# Patient Record
Sex: Female | Born: 1975 | Race: White | Hispanic: No | Marital: Single | Smoking: Former smoker
Health system: Southern US, Community
[De-identification: ages and names within clinical notes are randomized; demographics above are authoritative.]

## PROBLEM LIST (undated history)

## (undated) DIAGNOSIS — F1010 Alcohol abuse, uncomplicated: Secondary | ICD-10-CM

## (undated) DIAGNOSIS — F419 Anxiety disorder, unspecified: Secondary | ICD-10-CM

## (undated) DIAGNOSIS — I10 Essential (primary) hypertension: Secondary | ICD-10-CM

## (undated) DIAGNOSIS — M109 Gout, unspecified: Secondary | ICD-10-CM

## (undated) HISTORY — DX: Essential (primary) hypertension: I10

## (undated) HISTORY — DX: Anxiety disorder, unspecified: F41.9

---

## 1999-07-07 ENCOUNTER — Emergency Department (HOSPITAL_COMMUNITY): Admission: EM | Admit: 1999-07-07 | Discharge: 1999-07-07 | Payer: Self-pay | Admitting: Emergency Medicine

## 2001-05-30 ENCOUNTER — Other Ambulatory Visit: Admission: RE | Admit: 2001-05-30 | Discharge: 2001-05-30 | Payer: Self-pay

## 2002-02-27 ENCOUNTER — Other Ambulatory Visit: Admission: RE | Admit: 2002-02-27 | Discharge: 2002-02-27 | Payer: Self-pay | Admitting: *Deleted

## 2003-04-10 ENCOUNTER — Other Ambulatory Visit: Admission: RE | Admit: 2003-04-10 | Discharge: 2003-04-10 | Payer: Self-pay | Admitting: *Deleted

## 2003-12-26 ENCOUNTER — Emergency Department (HOSPITAL_COMMUNITY): Admission: EM | Admit: 2003-12-26 | Discharge: 2003-12-26 | Payer: Self-pay | Admitting: Emergency Medicine

## 2004-06-29 ENCOUNTER — Other Ambulatory Visit: Admission: RE | Admit: 2004-06-29 | Discharge: 2004-06-29 | Payer: Self-pay | Admitting: Family Medicine

## 2004-11-18 ENCOUNTER — Other Ambulatory Visit: Admission: RE | Admit: 2004-11-18 | Discharge: 2004-11-18 | Payer: Self-pay | Admitting: Family Medicine

## 2007-02-02 ENCOUNTER — Other Ambulatory Visit: Admission: RE | Admit: 2007-02-02 | Discharge: 2007-02-02 | Payer: Self-pay | Admitting: Family Medicine

## 2008-03-24 ENCOUNTER — Ambulatory Visit (HOSPITAL_COMMUNITY): Admission: RE | Admit: 2008-03-24 | Discharge: 2008-03-24 | Payer: Self-pay | Admitting: Obstetrics & Gynecology

## 2009-07-31 ENCOUNTER — Other Ambulatory Visit: Admission: RE | Admit: 2009-07-31 | Discharge: 2009-07-31 | Payer: Self-pay | Admitting: Family Medicine

## 2012-12-14 ENCOUNTER — Ambulatory Visit (INDEPENDENT_AMBULATORY_CARE_PROVIDER_SITE_OTHER): Payer: BC Managed Care – PPO | Admitting: Family Medicine

## 2012-12-14 VITALS — BP 110/90 | HR 104 | Temp 98.4°F | Resp 16 | Ht 61.0 in | Wt 118.0 lb

## 2012-12-14 DIAGNOSIS — R51 Headache: Secondary | ICD-10-CM

## 2012-12-14 DIAGNOSIS — S0181XA Laceration without foreign body of other part of head, initial encounter: Secondary | ICD-10-CM

## 2012-12-14 DIAGNOSIS — Z23 Encounter for immunization: Secondary | ICD-10-CM

## 2012-12-14 DIAGNOSIS — S0180XA Unspecified open wound of other part of head, initial encounter: Secondary | ICD-10-CM

## 2012-12-14 MED ORDER — CEPHALEXIN 500 MG PO CAPS: 500.0000 mg | ORAL_CAPSULE | Freq: Two times a day (BID) | ORAL | Status: DC

## 2012-12-14 NOTE — Progress Notes (Signed)
Verbal consent obtained from the patient.  Local anesthesia with 4cc Lidocaine 2% with epinephrine.  Wound scrubbed with soap and water and rinsed.  Wound closed with #7 5-0 Prolene simple interrupted sutures.  Wound cleansed and dressed.

## 2012-12-14 NOTE — Patient Instructions (Addendum)

## 2012-12-14 NOTE — Progress Notes (Signed)
[facility] [address] [place] [address] [phone]  Date:  12/14/2012   Name:  Alexandra Dorsey   DOB:  1976-07-03   MRN:  [id number]  PCP:  No primary provider on file.    Chief Complaint: Laceration   History of Present Illness:  Alexandra Dorsey is a 37 y.o. very pleasant female patient who presents with the following:  She was walking her dog last night and fell- around 11:30 pm.  She tripped and hit her head on something on the ground- maybe a tree root.   No LOC.  She is tender over the cut, but no HA, no nausea or vomiting.  She "feel great" except for her laceration.  She wanted to see if she could avoid stitches but became aware that her cut was pretty bad so she came in.    She has missed her most recent menses but she saw her doctor yesterday and has blood and urine HCG testing- she is not pregnant, they think she may have not ovulated and she has medication to take for this but has not started it yet- unsure of the name.  She is generally healthy, not on any medications   There is no problem list on file for this patient.   History reviewed. No pertinent past medical history.  History reviewed. No pertinent past surgical history.  History  Substance Use Topics  . Smoking status: Never Smoker   . Smokeless tobacco: Not on file  . Alcohol Use: Not on file    No family history on file.  No Known Allergies  Medication list has been reviewed and updated.  No current outpatient prescriptions on file prior to visit.   No current facility-administered medications on file prior to visit.    Review of Systems:  As per HPI- otherwise negative.   Physical Examination: Filed Vitals:   12/14/12 1123  BP: 110/90  Pulse: 104  Temp: 98.4 F (36.9 C)  Resp: 16   Filed Vitals:   12/14/12 1123  Height: 5\' 1"  (1.549 m)  Weight: 118 lb (53.524 kg)   Body mass index is 22.31 kg/(m^2). Ideal Body Weight: Weight in (lb) to have  BMI = 25: 132  GEN: WDWN, NAD, Non-toxic, A & O x 3 HEENT: Atraumatic, Normocephalic. Neck supple. No masses, No LAD. Bilateral TM wnl, oropharynx normal.  PEERL,EOMI.   Laceration on her left medial forehead, approx 2.5 cm in length, down to fascia over skull.  Lies together well, neat edges, no visible debris Cervical spine is non- tender, normal ROM Ears and Nose: No external deformity. CV: RRR, No M/G/R. No JVD. No thrill. No extra heart sounds. PULM: CTA B, no wheezes, crackles, rhonchi. No retractions. No resp. distress. No accessory muscle use. EXTR: No c/c/e NEURO Normal gait. Normal strength, movement and DTR all extremities PSYCH: Normally interactive. Conversant. Not depressed or anxious appearing.  Calm demeanor.    Assessment and Plan: Laceration of face, initial encounter - Plan: Tdap vaccine greater than or equal to 7yo IM, cephALEXin (KEFLEX) 500 MG capsule  Pain, face  Laceration repaired as per note by Porfirio Oar, PA- C.  Will cover with keflex as the wound is about 12 hours long. explained slightly increased risk of infection in closing a wound late, but this large wound clearly needs to be closed Updated tdap as she is not sure of the date of her last shot and thinks it may have been 7  or 8 years ago.    Went over head injury precautions- if she develops HA, nausea, vomiting, confusion, or other symptoms please seek care right away   [facility], MD

## 2012-12-19 ENCOUNTER — Ambulatory Visit (INDEPENDENT_AMBULATORY_CARE_PROVIDER_SITE_OTHER): Payer: BC Managed Care – PPO | Admitting: Family Medicine

## 2012-12-19 VITALS — BP 138/87 | HR 80 | Temp 98.4°F | Resp 18

## 2012-12-19 DIAGNOSIS — Z4802 Encounter for removal of sutures: Secondary | ICD-10-CM

## 2012-12-19 NOTE — Progress Notes (Signed)
Subjective: Patient is here for sutures removed. She had questions about plastic surgeons which I discussed with her.  Objective: Wound is well-healed. Has 2 small scabs. Sutures removed. The 2 tiny scans were removed. Patient tolerated this well. Steri-Strips were applied, and was recommended that she keep him on for 2 or 3 more days.  Assessment: Suture removal  Plan: Return if further concerns.Theora Gianotti PA will refer her to a plastic surgeon if she is not satisfied after a period of time with the scar.

## 2013-04-17 ENCOUNTER — Ambulatory Visit (INDEPENDENT_AMBULATORY_CARE_PROVIDER_SITE_OTHER): Payer: BC Managed Care – PPO | Admitting: Emergency Medicine

## 2013-04-17 VITALS — BP 117/82 | HR 106 | Temp 98.5°F | Resp 16 | Ht 60.5 in | Wt 115.4 lb

## 2013-04-17 DIAGNOSIS — S61411A Laceration without foreign body of right hand, initial encounter: Secondary | ICD-10-CM

## 2013-04-17 DIAGNOSIS — M79609 Pain in unspecified limb: Secondary | ICD-10-CM

## 2013-04-17 DIAGNOSIS — S61409A Unspecified open wound of unspecified hand, initial encounter: Secondary | ICD-10-CM

## 2013-04-17 DIAGNOSIS — M79641 Pain in right hand: Secondary | ICD-10-CM

## 2013-04-17 NOTE — Progress Notes (Signed)
Patient ID: MEI SUITS MRN: [id number], DOB: [birth year], 37 y.o. Date of Encounter: 04/17/2013, 6:33 PM   PROCEDURE NOTE: Verbal consent obtained. Sterile technique employed. Numbing: Anesthesia obtained with 2% lidocaine plain.   Cleansed with soap and water. Irrigated.  Wound explored, no deep structures involved, no foreign bodies.   Wound repaired with # 5 SI sutures using 4-0 ethilon.  Hemostasis obtained. Wound cleansed and dressed.  Wound care instructions including precautions covered with patient. Handout given.  Anticipate suture removal in 7-10 days  Rhoderick Moody, PA-C 04/17/2013 6:33 PM

## 2013-04-17 NOTE — Patient Instructions (Signed)

## 2013-04-17 NOTE — Progress Notes (Signed)
[facility] [address] [place] [address] [phone]  Date:  04/17/2013   Name:  Alexandra Dorsey   DOB:  1976/02/03   MRN:  [id number]  PCP:  No PCP Per Patient    Chief Complaint: Hand Injury   History of Present Illness:  Alexandra Dorsey is a 37 y.o. 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  She is current on tetanus.  No improvement with over the counter medications or other home remedies. Denies other complaint or health concern today.   There are no active problems to display for this patient.   History reviewed. No pertinent past medical history.  History reviewed. No pertinent past surgical history.  History  Substance Use Topics  . Smoking status: Former Games developer  . Smokeless tobacco: Not on file  . Alcohol Use: 6.0 oz/week    10 Cans of beer per week    History reviewed. No pertinent family history.  No Known Allergies  Medication list has been reviewed and updated.  Current Outpatient Prescriptions on File Prior to Visit  Medication Sig Dispense Refill  . cephALEXin (KEFLEX) 500 MG capsule Take 1 capsule (500 mg total) by mouth 2 (two) times daily.  20 capsule  0   No current facility-administered medications on file prior to visit.    Review of Systems:  As per HPI, otherwise negative.    Physical Examination: Filed Vitals:   04/17/13 1744  BP: 117/82  Pulse: 106  Temp: 98.5 F (36.9 C)  Resp: 16   Filed Vitals:   04/17/13 1744  Height: 5' 0.5" (1.537 m)  Weight: 115 lb 6.4 oz (52.345 kg)   Body mass index is 22.16 kg/(m^2). Ideal Body Weight: Weight in (lb) to have BMI = 25: 129.9   GEN: WDWN, NAD, Non-toxic, Alert & Oriented x 3 HEENT: Atraumatic, Normocephalic.  Ears and Nose: No external deformity. EXTR: No clubbing/cyanosis/edema NEURO: Normal gait.  PSYCH: Normally interactive. Conversant. Not depressed or anxious  appearing.  Calm demeanor.  RIGHT hand:  2 cm laceration in interdigital web between thumb and second finger.  NO FB  NATI.   Assessment and Plan: Laceration hand Hand pain  Signed,  Phillips Odor, MD

## 2013-04-25 ENCOUNTER — Ambulatory Visit (INDEPENDENT_AMBULATORY_CARE_PROVIDER_SITE_OTHER): Payer: BC Managed Care – PPO | Admitting: Physician Assistant

## 2013-04-25 DIAGNOSIS — L03019 Cellulitis of unspecified finger: Secondary | ICD-10-CM

## 2013-04-25 DIAGNOSIS — L02519 Cutaneous abscess of unspecified hand: Secondary | ICD-10-CM

## 2013-04-25 DIAGNOSIS — S61209S Unspecified open wound of unspecified finger without damage to nail, sequela: Secondary | ICD-10-CM

## 2013-04-25 DIAGNOSIS — IMO0002 Reserved for concepts with insufficient information to code with codable children: Secondary | ICD-10-CM

## 2013-04-25 MED ORDER — CEPHALEXIN 500 MG PO CAPS: 500.0000 mg | ORAL_CAPSULE | Freq: Two times a day (BID) | ORAL | Status: DC

## 2013-04-25 NOTE — Progress Notes (Signed)
  Subjective:    Patient ID: Alexandra Dorsey, female    DOB: Aug 20, 1976, 37 y.o.   MRN: [id number]  HPI 37 year old female present for suture removal. DOI 04/17/13.  States she does not think it has been doing well. Has tried to keep it covered and dry, but over the last 3 days it has become red, irritated, slightly swollen, and painful to touch. She has not noticed any drainage but is concerned about how much the pain has increased.  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       Review of Systems  Constitutional: Negative for fever and chills.  Gastrointestinal: Negative for nausea and vomiting.  Skin: Positive for color change and wound.       Objective:   Physical Exam  Constitutional: She is oriented to person, place, and time. She appears well-developed and well-nourished.  HENT:  Head: Normocephalic and atraumatic.  Right Ear: External ear normal.  Left Ear: External ear normal.  Eyes: Conjunctivae are normal.  Neck: Normal range of motion.  Cardiovascular: Normal rate.   Pulmonary/Chest: Effort normal.  Neurological: She is alert and oriented to person, place, and time.  Skin:  Wound of right webbed space between thumb and index finger.  There is a surrounding area of erythema with superficial skin macerated.  No drainage or warmth.      Sutures removed without difficulty. No purulence noted but I did obtain a culture.  #1 steri strip applied. Patient tolerated well.      Assessment & Plan:   Wound, open, finger, sequela - Plan: Wound culture  Cellulitis, finger, right  Erythema possibly due to inflammatory reaction combined with her inability to keep wound completely dry.  Infection is possible - culture pending. Start Keflex 500 mg bid x 7 days Placed in thumb spica splint to prevent unnecessary movement of thumb Recommend leave wound open to the air while at home. Wear gloves if washing dishes at work.   Follow up if symptoms worsen or fail to improve

## 2013-04-28 LAB — WOUND CULTURE
Gram Stain: NONE SEEN
Gram Stain: NONE SEEN

## 2013-08-09 ENCOUNTER — Emergency Department (HOSPITAL_COMMUNITY)
Admission: EM | Admit: 2013-08-09 | Discharge: 2013-08-10 | Disposition: A | Discharge status: Home / Self Care | Payer: BC Managed Care – PPO | Attending: Emergency Medicine | Admitting: Emergency Medicine

## 2013-08-09 ENCOUNTER — Encounter (HOSPITAL_COMMUNITY): Payer: Self-pay | Admitting: Emergency Medicine

## 2013-08-09 DIAGNOSIS — Z87891 Personal history of nicotine dependence: Secondary | ICD-10-CM | POA: Insufficient documentation

## 2013-08-09 DIAGNOSIS — R071 Chest pain on breathing: Secondary | ICD-10-CM | POA: Insufficient documentation

## 2013-08-09 DIAGNOSIS — R0602 Shortness of breath: Secondary | ICD-10-CM | POA: Insufficient documentation

## 2013-08-09 DIAGNOSIS — R1031 Right lower quadrant pain: Secondary | ICD-10-CM | POA: Insufficient documentation

## 2013-08-09 DIAGNOSIS — R1011 Right upper quadrant pain: Secondary | ICD-10-CM | POA: Insufficient documentation

## 2013-08-09 DIAGNOSIS — R1013 Epigastric pain: Secondary | ICD-10-CM | POA: Insufficient documentation

## 2013-08-09 DIAGNOSIS — M7989 Other specified soft tissue disorders: Secondary | ICD-10-CM | POA: Insufficient documentation

## 2013-08-09 LAB — CBC WITH DIFFERENTIAL/PLATELET
Basophils Absolute: 0 10*3/uL (ref 0.0–0.1)
Basophils Relative: 0 % (ref 0–1)
Eosinophils Absolute: 0 10*3/uL (ref 0.0–0.7)
Eosinophils Relative: 0 % (ref 0–5)
Hemoglobin: 14.8 g/dL (ref 12.0–15.0)
Monocytes Absolute: 0.6 10*3/uL (ref 0.1–1.0)
Monocytes Relative: 6 % (ref 3–12)
Neutro Abs: 5.6 10*3/uL (ref 1.7–7.7)
Platelets: 247 10*3/uL (ref 150–400)
RBC: 4.26 MIL/uL (ref 3.87–5.11)
WBC: 9.8 10*3/uL (ref 4.0–10.5)

## 2013-08-09 LAB — POCT I-STAT, CHEM 8
Calcium, Ion: 1.14 mmol/L (ref 1.12–1.23)
Potassium: 3.9 mEq/L (ref 3.5–5.1)
Sodium: 140 mEq/L (ref 135–145)

## 2013-08-09 LAB — TROPONIN I: Troponin I: <0.3 ng/mL (ref <0.30)

## 2013-08-09 NOTE — ED Notes (Signed)
EKG given to EDP,Allen,MD. 

## 2013-08-09 NOTE — ED Notes (Signed)
Pt states she started having rib pain yesterday and tonight it is worse,  Pt smells of ETOH,  Denies injury or falls
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
## 2013-08-10 ENCOUNTER — Emergency Department (HOSPITAL_COMMUNITY): Payer: BC Managed Care – PPO

## 2013-08-10 LAB — HEPATIC FUNCTION PANEL
AST: 27 U/L (ref 0–37)
Alkaline Phosphatase: 43 U/L (ref 39–117)
Bilirubin, Direct: <0.1 mg/dL (ref 0.0–0.3)
Total Bilirubin: 0.2 mg/dL — ABNORMAL LOW (ref 0.3–1.2)

## 2013-08-10 MED ORDER — MORPHINE SULFATE 4 MG/ML IJ SOLN: 4.0000 mg | Freq: Once | INTRAMUSCULAR | Status: DC

## 2013-08-10 MED ORDER — OXYCODONE-ACETAMINOPHEN 5-325 MG PO TABS
1.0000 | ORAL_TABLET | Freq: Once | ORAL | Status: AC
  Administered 2013-08-10: 1 via ORAL
  Filled 2013-08-10: qty 1

## 2013-08-10 MED ORDER — OXYCODONE-ACETAMINOPHEN 5-325 MG PO TABS: 1.0000 | ORAL_TABLET | Freq: Four times a day (QID) | ORAL | Status: DC | PRN

## 2013-08-10 MED ORDER — MORPHINE SULFATE 4 MG/ML IJ SOLN
4.0000 mg | Freq: Once | INTRAMUSCULAR | Status: AC
  Administered 2013-08-10: 4 mg via INTRAVENOUS
  Filled 2013-08-10: qty 1

## 2013-08-10 MED ORDER — ONDANSETRON HCL 4 MG/2ML IJ SOLN
4.0000 mg | Freq: Once | INTRAMUSCULAR | Status: AC
  Administered 2013-08-10: 4 mg via INTRAVENOUS
  Filled 2013-08-10: qty 2

## 2013-08-10 NOTE — ED Provider Notes (Signed)
Medical screening examination/treatment/procedure(s) were performed by non-physician practitioner and as supervising physician I was immediately available for consultation/collaboration.    Cillian Gwinner M Hayven Fatima, MD 08/10/13 0300 

## 2013-08-22 ENCOUNTER — Other Ambulatory Visit: Payer: Self-pay

## 2014-01-01 ENCOUNTER — Encounter (HOSPITAL_COMMUNITY): Payer: Self-pay | Admitting: Emergency Medicine

## 2014-01-01 ENCOUNTER — Emergency Department (HOSPITAL_COMMUNITY)
Admission: EM | Admit: 2014-01-01 | Discharge: 2014-01-02 | Disposition: A | Discharge status: Home / Self Care | Payer: BC Managed Care – PPO | Attending: Emergency Medicine | Admitting: Emergency Medicine

## 2014-01-01 DIAGNOSIS — M545 Low back pain, unspecified: Secondary | ICD-10-CM

## 2014-01-01 DIAGNOSIS — Z87891 Personal history of nicotine dependence: Secondary | ICD-10-CM | POA: Insufficient documentation

## 2014-01-01 DIAGNOSIS — F10929 Alcohol use, unspecified with intoxication, unspecified: Secondary | ICD-10-CM

## 2014-01-01 DIAGNOSIS — R000 Tachycardia, unspecified: Secondary | ICD-10-CM | POA: Insufficient documentation

## 2014-01-01 DIAGNOSIS — Y92410 Unspecified street and highway as the place of occurrence of the external cause: Secondary | ICD-10-CM | POA: Insufficient documentation

## 2014-01-01 DIAGNOSIS — Y9389 Activity, other specified: Secondary | ICD-10-CM | POA: Insufficient documentation

## 2014-01-01 DIAGNOSIS — Z3202 Encounter for pregnancy test, result negative: Secondary | ICD-10-CM | POA: Insufficient documentation

## 2014-01-01 DIAGNOSIS — IMO0002 Reserved for concepts with insufficient information to code with codable children: Secondary | ICD-10-CM | POA: Insufficient documentation

## 2014-01-01 DIAGNOSIS — S060X9A Concussion with loss of consciousness of unspecified duration, initial encounter: Secondary | ICD-10-CM | POA: Insufficient documentation

## 2014-01-01 DIAGNOSIS — F1010 Alcohol abuse, uncomplicated: Secondary | ICD-10-CM | POA: Insufficient documentation

## 2014-01-01 DIAGNOSIS — R4020 Unspecified coma: Secondary | ICD-10-CM
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
## 2014-01-02 ENCOUNTER — Emergency Department (HOSPITAL_COMMUNITY): Payer: BC Managed Care – PPO

## 2014-01-02 ENCOUNTER — Encounter (HOSPITAL_COMMUNITY): Payer: Self-pay | Admitting: Radiology

## 2014-01-02 LAB — URINALYSIS, ROUTINE W REFLEX MICROSCOPIC
Bilirubin Urine: NEGATIVE
Glucose, UA: NEGATIVE mg/dL
Hgb urine dipstick: NEGATIVE
Ketones, ur: NEGATIVE mg/dL
Leukocytes, UA: NEGATIVE
Nitrite: NEGATIVE
Protein, ur: NEGATIVE mg/dL
SPECIFIC GRAVITY, URINE: 1.006 (ref 1.005–1.030)
Urobilinogen, UA: 0.2 mg/dL (ref 0.0–1.0)
pH: 6 (ref 5.0–8.0)

## 2014-01-02 LAB — CBC WITH DIFFERENTIAL/PLATELET
Basophils Absolute: 0 10*3/uL (ref 0.0–0.1)
Basophils Relative: 0 % (ref 0–1)
EOS ABS: 0 10*3/uL (ref 0.0–0.7)
Eosinophils Relative: 0 % (ref 0–5)
HCT: 41.2 % (ref 36.0–46.0)
Hemoglobin: 14.7 g/dL (ref 12.0–15.0)
LYMPHS PCT: 26 % (ref 12–46)
Lymphs Abs: 1.5 10*3/uL (ref 0.7–4.0)
MCH: 34 pg (ref 26.0–34.0)
MCHC: 35.7 g/dL (ref 30.0–36.0)
MCV: 95.4 fL (ref 78.0–100.0)
MONO ABS: 0.4 10*3/uL (ref 0.1–1.0)
Monocytes Relative: 7 % (ref 3–12)
NEUTROS ABS: 3.9 10*3/uL (ref 1.7–7.7)
Neutrophils Relative %: 67 % (ref 43–77)
PLATELETS: 214 10*3/uL (ref 150–400)
RBC: 4.32 MIL/uL (ref 3.87–5.11)
RDW: 12.4 % (ref 11.5–15.5)
WBC: 5.9 10*3/uL (ref 4.0–10.5)

## 2014-01-02 LAB — COMPREHENSIVE METABOLIC PANEL
ALK PHOS: 49 U/L (ref 39–117)
ALT: 8 U/L (ref 0–35)
AST: 25 U/L (ref 0–37)
Albumin: 4.4 g/dL (ref 3.5–5.2)
BUN: 6 mg/dL (ref 6–23)
CO2: 22 mEq/L (ref 19–32)
CREATININE: 0.57 mg/dL (ref 0.50–1.10)
Calcium: 9.4 mg/dL (ref 8.4–10.5)
Chloride: 103 mEq/L (ref 96–112)
GFR calc Af Amer: >90 mL/min (ref >90)
Glucose, Bld: 102 mg/dL — ABNORMAL HIGH (ref 70–99)
Potassium: 3.5 mEq/L — ABNORMAL LOW (ref 3.7–5.3)
Sodium: 145 mEq/L (ref 137–147)
TOTAL PROTEIN: 7.8 g/dL (ref 6.0–8.3)

## 2014-01-02 LAB — POC URINE PREG, ED: Preg Test, Ur: NEGATIVE

## 2014-01-02 LAB — ETHANOL: Alcohol, Ethyl (B): 270 mg/dL — ABNORMAL HIGH (ref 0–11)

## 2014-01-02 LAB — LIPASE, BLOOD: LIPASE: 36 U/L (ref 11–59)

## 2014-01-02 MED ORDER — IOHEXOL 300 MG/ML  SOLN
100.0000 mL | Freq: Once | INTRAMUSCULAR | Status: AC | PRN
  Administered 2014-01-02: 100 mL via INTRAVENOUS

## 2014-01-02 MED ORDER — IBUPROFEN 800 MG PO TABS
800.0000 mg | ORAL_TABLET | Freq: Once | ORAL | Status: DC
  Filled 2014-01-02: qty 1

## 2014-01-02 MED ORDER — IBUPROFEN 800 MG PO TABS: 800.0000 mg | ORAL_TABLET | Freq: Three times a day (TID) | ORAL | Status: DC

## 2014-01-02 MED ORDER — METHOCARBAMOL 750 MG PO TABS: 750.0000 mg | ORAL_TABLET | Freq: Four times a day (QID) | ORAL | Status: DC

## 2014-01-02 MED ORDER — METHOCARBAMOL 500 MG PO TABS
750.0000 mg | ORAL_TABLET | Freq: Once | ORAL | Status: AC
  Administered 2014-01-02: 750 mg via ORAL
  Filled 2014-01-02: qty 2

## 2014-01-02 NOTE — ED Notes (Signed)
Patient is ambulatory. Patient is being discharged with her mother, who is going to drive her home.

## 2014-01-02 NOTE — Discharge Instructions (Signed)
Musculoskeletal Pain Musculoskeletal pain is muscle and boney aches and pains. These pains can occur in any part of the body. Your caregiver may treat you without knowing the cause of the pain. They may treat you if blood or urine tests, X-rays, and other tests were normal.  CAUSES There is often not a definite cause or reason for these pains. These pains may be caused by a type of germ (virus). The discomfort may also come from overuse. Overuse includes working out too hard when your body is not fit. Boney aches also come from weather changes. Bone is sensitive to atmospheric pressure changes. HOME CARE INSTRUCTIONS   Ask when your test results will be ready. Make sure you get your test results.  Only take over-the-counter or prescription medicines for pain, discomfort, or fever as directed by your caregiver. If you were given medications for your condition, do not drive, operate machinery or power tools, or sign legal documents for 24 hours. Do not drink alcohol. Do not take sleeping pills or other medications that may interfere with treatment.  Continue all activities unless the activities cause more pain. When the pain lessens, slowly resume normal activities. Gradually increase the intensity and duration of the activities or exercise.  During periods of severe pain, bed rest may be helpful. Lay or sit in any position that is comfortable.  Putting ice on the injured area.  Put ice in a bag.  Place a towel between your skin and the bag.  Leave the ice on for 15 to 20 minutes, 3 to 4 times a day.  Follow up with your caregiver for continued problems and no reason can be found for the pain. If the pain becomes worse or does not go away, it may be necessary to repeat tests or do additional testing. Your caregiver may need to look further for a possible cause. SEEK IMMEDIATE MEDICAL CARE IF:  You have pain that is getting worse and is not relieved by medications.  You develop chest pain  that is associated with shortness or breath, sweating, feeling sick to your stomach (nauseous), or throw up (vomit).  Your pain becomes localized to the abdomen.  You develop any new symptoms that seem different or that concern you. MAKE SURE YOU:   Understand these instructions.  Will watch your condition.  Will get help right away if you are not doing well or get worse. Document Released: 10/03/2005 Document Revised: 12/26/2011 Document Reviewed: 06/07/2013 [facility] Patient Information 2014 [place], [place].   Alcohol Intoxication Alcohol intoxication occurs when the amount of alcohol that a person has consumed impairs his or her ability to mentally and physically function. Alcohol directly impairs the normal chemical activity of the brain. Drinking large amounts of alcohol can lead to changes in mental function and behavior, and it can cause many physical effects that can be harmful.  Alcohol intoxication can range in severity from mild to very severe. Various factors can affect the level of intoxication that occurs, such as the person's age, gender, weight, frequency of alcohol consumption, and the presence of other medical conditions (such as diabetes, seizures, or heart conditions). Dangerous levels of alcohol intoxication may occur when people drink large amounts of alcohol in a short period (binge drinking). Alcohol can also be especially dangerous when combined with certain prescription medicines or "recreational" drugs. SIGNS AND SYMPTOMS Some common signs and symptoms of mild alcohol intoxication include:  Loss of coordination.  Changes in mood and behavior.  Impaired judgment.  Slurred  speech. As alcohol intoxication progresses to more severe levels, other signs and symptoms will appear. These may include:  Vomiting.  Confusion and impaired memory.  Slowed breathing.  Seizures.  Loss of consciousness. DIAGNOSIS  Your health care provider will take a medical history  and perform a physical exam. You will be asked about the amount and type of alcohol you have consumed. Blood tests will be done to measure the concentration of alcohol in your blood. In many places, your blood alcohol level must be lower than 80 mg/dL (9.60%) to legally drive. However, many dangerous effects of alcohol can occur at much lower levels.  TREATMENT  People with alcohol intoxication often do not require treatment. Most of the effects of alcohol intoxication are temporary, and they go away as the alcohol naturally leaves the body. Your health care provider will monitor your condition until you are stable enough to go home. Fluids are sometimes given through an IV access tube to help prevent dehydration.  HOME CARE INSTRUCTIONS  Do not drive after drinking alcohol.  Stay hydrated. Drink enough water and fluids to keep your urine clear or pale yellow. Avoid caffeine.   Only take over-the-counter or prescription medicines as directed by your health care provider.  SEEK MEDICAL CARE IF:   You have persistent vomiting.   You do not feel better after a few days.  You have frequent alcohol intoxication. Your health care provider can help determine if you should see a substance use treatment counselor. SEEK IMMEDIATE MEDICAL CARE IF:   You become shaky or tremble when you try to stop drinking.   You shake uncontrollably (seizure).   You throw up (vomit) blood. This may be bright red or may look like black coffee grounds.   You have blood in your stool. This may be bright red or may appear as a black, tarry, bad smelling stool.   You become lightheaded or faint.  MAKE SURE YOU:   Understand these instructions.  Will watch your condition.  Will get help right away if you are not doing well or get worse. Document Released: 07/13/2005 Document Revised: 06/05/2013 Document Reviewed: 03/08/2013 [facility] Patient Information 2014 [place], [place].
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
## 2014-03-20 ENCOUNTER — Ambulatory Visit: Payer: BC Managed Care – PPO | Admitting: Internal Medicine

## 2014-03-20 DIAGNOSIS — Z0289 Encounter for other administrative examinations: Secondary | ICD-10-CM

## 2014-08-01 ENCOUNTER — Other Ambulatory Visit: Payer: Self-pay

## 2014-09-24 ENCOUNTER — Ambulatory Visit: Payer: BC Managed Care – PPO | Admitting: Cardiology

## 2014-09-30 ENCOUNTER — Encounter: Payer: Self-pay | Admitting: Cardiology

## 2014-11-05 ENCOUNTER — Ambulatory Visit (INDEPENDENT_AMBULATORY_CARE_PROVIDER_SITE_OTHER): Payer: No Typology Code available for payment source | Admitting: Family Medicine

## 2014-11-05 VITALS — BP 124/100 | HR 126 | Temp 98.2°F | Resp 18 | Ht 62.0 in | Wt 122.2 lb

## 2014-11-05 DIAGNOSIS — IMO0001 Reserved for inherently not codable concepts without codable children: Secondary | ICD-10-CM

## 2014-11-05 DIAGNOSIS — S8991XA Unspecified injury of right lower leg, initial encounter: Secondary | ICD-10-CM

## 2014-11-05 DIAGNOSIS — I10 Essential (primary) hypertension: Secondary | ICD-10-CM

## 2014-11-05 MED ORDER — MELOXICAM 7.5 MG PO TABS: 7.5000 mg | ORAL_TABLET | Freq: Every day | ORAL | Status: DC

## 2014-11-05 MED ORDER — HYDROCODONE-ACETAMINOPHEN 5-325 MG PO TABS: 1.0000 | ORAL_TABLET | Freq: Four times a day (QID) | ORAL | Status: DC | PRN

## 2014-11-05 MED ORDER — LISINOPRIL 10 MG PO TABS: 10.0000 mg | ORAL_TABLET | Freq: Every day | ORAL | Status: DC

## 2014-11-05 NOTE — Patient Instructions (Signed)
Plantaris muscle strain

## 2014-11-05 NOTE — Progress Notes (Signed)
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   Had right ankle surgery when young NO leg swelling.  Objective:  Tender belly of right gastroc. FROM of foot but has pain with weight bearing  Assessment:  Plantaris mm strain  Plan:  meloxicam 7.5 mg daily norco 5-325  Also, patient has had blood pressures of 150/100 for a year. No chest pain.  Objective:   Chest:  Clear Heart:  Reg, no murmur No  Edema  This chart was scribed in my presence and reviewed by me personally.    ICD-9-CM ICD-10-CM   1. Essential hypertension 401.9 I10 lisinopril (PRINIVIL,ZESTRIL) 10 MG tablet  2. Injury of plantaris muscle or tendon, right, initial encounter 959.7 S89.91XA meloxicam (MOBIC) 7.5 MG tablet     HYDROcodone-acetaminophen (NORCO) 5-325 MG per tablet     Signed, Elvina SidleKurt Jajuan Skoog, MD

## 2014-11-17 ENCOUNTER — Ambulatory Visit (INDEPENDENT_AMBULATORY_CARE_PROVIDER_SITE_OTHER): Payer: No Typology Code available for payment source | Admitting: Family Medicine

## 2014-11-17 VITALS — BP 140/84 | HR 82 | Temp 98.9°F | Resp 16 | Ht 61.0 in | Wt 121.2 lb

## 2014-11-17 DIAGNOSIS — IMO0001 Reserved for inherently not codable concepts without codable children: Secondary | ICD-10-CM

## 2014-11-17 DIAGNOSIS — S96911D Strain of unspecified muscle and tendon at ankle and foot level, right foot, subsequent encounter: Secondary | ICD-10-CM

## 2014-11-17 DIAGNOSIS — F411 Generalized anxiety disorder: Secondary | ICD-10-CM

## 2014-11-17 DIAGNOSIS — R030 Elevated blood-pressure reading, without diagnosis of hypertension: Secondary | ICD-10-CM

## 2014-11-17 DIAGNOSIS — S86111D Strain of other muscle(s) and tendon(s) of posterior muscle group at lower leg level, right leg, subsequent encounter: Secondary | ICD-10-CM

## 2014-11-17 MED ORDER — LOSARTAN POTASSIUM 50 MG PO TABS: 50.0000 mg | ORAL_TABLET | Freq: Every day | ORAL | Status: DC

## 2014-11-17 MED ORDER — ALPRAZOLAM 0.5 MG PO TABS: 0.5000 mg | ORAL_TABLET | Freq: Every day | ORAL | Status: DC | PRN

## 2014-11-17 NOTE — Patient Instructions (Signed)
Let me know if you have any problems with the medications  Recheck blood pressure in 3 months

## 2014-11-17 NOTE — Progress Notes (Signed)
This chart was scribed for Elvina Sidle, MD by Luisa Dago, ED Scribe. This patient was seen in room 14 and the patient's care was started at 6:52 PM.  Patient ID: Alexandra Dorsey MRN: [id number], DOB: 06-21-1976, 39 y.o. Date of Encounter: 11/17/2014, 6:48 PM  Primary Physician: No PCP Per Patient  Chief Complaint:  Chief Complaint  Patient presents with   Hypertension    pt did not like the way lisinopril makes her feel.  stopped this medication on friday.      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  Pt presents to the office complaining of side effects of the Lisinopril. She's been experiencing a persistent dry cough for the past week.   She also Reports an episode of dizziness and light headedness while shopping at the mall. So EMS was called and they told her that her BP was elevated, they advised her to report to her PCP for a follow up. Positive intermittent anxiety symptoms for which she has taken her parents' medications which she thinks are xanax.  Denies any fever, neck pain, sore throat, visual disturbance, CP, cough, SOB, abdominal pain, nausea, emesis, diarrhea, urinary symptoms, back pain, HA, weakness, numbness and rash as associated symptoms.      Past Medical History  Diagnosis Date   Hypertension      Home Meds: Prior to Admission medications   Medication Sig Start Date End Date Taking? Authorizing Provider  HYDROcodone-acetaminophen (NORCO) 5-325 MG per tablet Take 1 tablet by mouth every 6 (six) hours as needed for moderate pain. Patient not taking: Reported on 11/17/2014 11/05/14   Elvina Sidle, MD  lisinopril (PRINIVIL,ZESTRIL) 10 MG tablet Take 1 tablet (10 mg total) by mouth daily. Patient not taking: Reported on 11/17/2014 11/05/14   Elvina Sidle, MD  meloxicam (MOBIC) 7.5 MG tablet Take 1 tablet (7.5 mg total) by mouth daily. Patient not taking: Reported on 11/17/2014 11/05/14   Elvina Sidle, MD    Allergies: No  Known Allergies  History   Social History   Marital Status: Married    Spouse Name: N/A    Number of Children: N/A   Years of Education: N/A   Occupational History   Not on file.   Social History Main Topics   Smoking status: Former Smoker   Smokeless tobacco: Not on file   Alcohol Use: 6.0 oz/week    10 Cans of beer per week     Comment: daily   Drug Use: No   Sexual Activity: Not Currently   Other Topics Concern   Not on file   Social History Narrative     Review of Systems: positive anxiety, light-headedness, dizziness, and diaphoresis Constitutional: negative for chills, fever, night sweats, weight changes, or fatigue  HEENT: negative for vision changes, hearing loss, congestion, rhinorrhea, ST, epistaxis, or sinus pressure Cardiovascular: negative for chest pain or palpitations Respiratory: negative for hemoptysis, wheezing, shortness of breath, or cough Abdominal: negative for abdominal pain, nausea, vomiting, diarrhea, or constipation Dermatological: negative for rash Neurologic: negative for headache or syncope All other systems reviewed and are otherwise negative with the exception to those above and in the HPI.   Physical Exam: Blood pressure 140/84, pulse 82, temperature 98.9 F (37.2 C), temperature source Oral, resp. rate 16, height  (1.549 m), weight 121 lb 4 oz (54.999 kg), last menstrual period 10/25/2014, SpO2 98 %., Body mass index is 22.92 kg/(m^2). General: Well developed, well nourished, in no  acute distress. Head: Normocephalic, atraumatic, eyes without discharge, sclera non-icteric, nares are without discharge. Bilateral auditory canals clear, TM's are without perforation, pearly grey and translucent with reflective cone of light bilaterally. Oral cavity moist, posterior pharynx without exudate, erythema, peritonsillar abscess, or post nasal drip.  Neck: Supple. No thyromegaly. Full ROM. No lymphadenopathy. Lungs: Clear bilaterally to  auscultation without wheezes, rales, or rhonchi. Breathing is unlabored. Heart: RRR with S1 S2. No murmurs, rubs, or gallops appreciated. BP rechecked and it was 130/80.  Abdomen: Soft, non-tender, non-distended with normoactive bowel sounds. No hepatomegaly. No rebound/guarding. No obvious abdominal masses. Msk:  Strength and tone normal for age. Extremities/Skin: Warm and dry. Ecchymosis noted to achillis area bilaterally.  Neuro: Alert and oriented X 3. Moves all extremities spontaneously. Gait is normal. CNII-XII grossly in tact. Psych:  Responds to questions appropriately with a normal affect.   Note: Patient showed me the medicines that she's been taking from her parents which we identified as Xanax.    ASSESSMENT AND PLAN:  39 y.o. year old female with  This chart was scribed in my presence and reviewed by me personally.    ICD-9-CM ICD-10-CM   1. Elevated BP 796.2 R03.0 losartan (COZAAR) 50 MG tablet  2. Anxiety state 300.00 F41.1 ALPRAZolam (XANAX) 0.5 MG tablet  3. Traumatic rupture of plantaris muscle, right, subsequent encounter V58.89 S96.911D    845.10       Signed, Elvina SidleKurt Lauenstein, MD 11/17/2014 6:48 PM

## 2015-02-05 ENCOUNTER — Encounter: Payer: Self-pay | Admitting: Family Medicine

## 2015-02-05 ENCOUNTER — Ambulatory Visit (INDEPENDENT_AMBULATORY_CARE_PROVIDER_SITE_OTHER): Payer: No Typology Code available for payment source | Admitting: Family Medicine

## 2015-02-05 VITALS — BP 132/96 | HR 81 | Temp 98.4°F | Resp 16 | Ht 61.5 in | Wt 121.0 lb

## 2015-02-05 DIAGNOSIS — F411 Generalized anxiety disorder: Secondary | ICD-10-CM | POA: Diagnosis not present

## 2015-02-05 DIAGNOSIS — R300 Dysuria: Secondary | ICD-10-CM | POA: Diagnosis not present

## 2015-02-05 DIAGNOSIS — M5441 Lumbago with sciatica, right side: Secondary | ICD-10-CM

## 2015-02-05 DIAGNOSIS — R030 Elevated blood-pressure reading, without diagnosis of hypertension: Secondary | ICD-10-CM

## 2015-02-05 LAB — POCT URINALYSIS DIPSTICK
Bilirubin, UA: NEGATIVE
Glucose, UA: NEGATIVE
Ketones, UA: NEGATIVE
Leukocytes, UA: NEGATIVE
Nitrite, UA: NEGATIVE
Protein, UA: NEGATIVE
Spec Grav, UA: >=1.03
Urobilinogen, UA: 0.2
pH, UA: 5

## 2015-02-05 LAB — CBC WITH DIFFERENTIAL/PLATELET
Basophils Absolute: 0 10*3/uL (ref 0.0–0.1)
Basophils Relative: 0 % (ref 0–1)
Eosinophils Absolute: 0 10*3/uL (ref 0.0–0.7)
Eosinophils Relative: 0 % (ref 0–5)
HCT: 39.6 % (ref 36.0–46.0)
Hemoglobin: 13.8 g/dL (ref 12.0–15.0)
Lymphocytes Relative: 12 % (ref 12–46)
Lymphs Abs: 1.3 10*3/uL (ref 0.7–4.0)
MCH: 33.6 pg (ref 26.0–34.0)
MCHC: 34.8 g/dL (ref 30.0–36.0)
MCV: 96.4 fL (ref 78.0–100.0)
MPV: 9.4 fL (ref 8.6–12.4)
Monocytes Absolute: 0.7 10*3/uL (ref 0.1–1.0)
Monocytes Relative: 6 % (ref 3–12)
Neutro Abs: 9.2 10*3/uL — ABNORMAL HIGH (ref 1.7–7.7)
Neutrophils Relative %: 82 % — ABNORMAL HIGH (ref 43–77)
Platelets: 254 10*3/uL (ref 150–400)
RBC: 4.11 MIL/uL (ref 3.87–5.11)
RDW: 13.7 % (ref 11.5–15.5)
WBC: 11.2 10*3/uL — ABNORMAL HIGH (ref 4.0–10.5)

## 2015-02-05 LAB — POCT UA - MICROSCOPIC ONLY
Casts, Ur, LPF, POC: NEGATIVE
Crystals, Ur, HPF, POC: NEGATIVE
Mucus, UA: POSITIVE
Yeast, UA: NEGATIVE

## 2015-02-05 MED ORDER — ALPRAZOLAM 0.5 MG PO TABS: 0.5000 mg | ORAL_TABLET | Freq: Every day | ORAL | Status: DC | PRN

## 2015-02-05 MED ORDER — CIPROFLOXACIN HCL 250 MG PO TABS: 250.0000 mg | ORAL_TABLET | Freq: Two times a day (BID) | ORAL | Status: DC

## 2015-02-05 MED ORDER — ESCITALOPRAM OXALATE 10 MG PO TABS: 10.0000 mg | ORAL_TABLET | Freq: Every day | ORAL | Status: DC

## 2015-02-05 NOTE — Progress Notes (Addendum)
Subjective:  This chart was scribed for Alexandra Sidle, MD by Evon Slack, ED Scribe. This patient was seen in room 27 and the patient's care was started at 10:18 AM.   Patient ID: Alexandra Dorsey, female    DOB: [birth year], 39 y.o.   MRN: [id number]  Chief Complaint  Patient presents with   Follow-up   Hypertension    not taking bp meds    HPI HPI Comments: Alexandra Dorsey is a 39 y.o. female with PMHx of HTN who presents to the [facility] for blood pressure recheck. Pt BP today was 120/90. Pt states that she was taking losartan but recently stopped taking it due not sleeping. Pt states that she is still taking the Xanax for anxiety. Pt states that she takes it as needed or at night. Pt states that taking xanax sometimes makes her feel tired and wants to know if there is medication that doesn't makes her as sleepy. Pt states that she would like to take the Xanax as needed. Pt is requesting a complete physical as well today. Pt reports having symptoms of UTI about 2 weeks prior. Pt states that she took Azo that has provided relief.   Pt also reports low back pain that shoots down the right leg. Pt states that she does stand on her feet all day everyday. 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      There are no active problems to display for this patient.  Past Medical History  Diagnosis Date   Hypertension    No past surgical history on file. Allergies  Allergen Reactions   Lisinopril Cough   Prior to Admission medications   Medication Sig Start Date End Date Taking? Authorizing Provider  ALPRAZolam Prudy Feeler) 0.5 MG tablet Take 1 tablet (0.5 mg total) by mouth daily as needed for anxiety. 11/17/14  Yes Alexandra Sidle, MD  losartan (COZAAR) 50 MG tablet Take 1 tablet (50 mg total) by mouth daily. Patient not taking: Reported on 02/05/2015 11/17/14   Alexandra Sidle, MD   History   Social History   Marital Status: Married   Spouse Name: N/A   Number of Children: N/A   Years of Education: N/A   Occupational History   Not on file.   Social History Main Topics   Smoking status: Former Smoker   Smokeless tobacco: Not on file   Alcohol Use: 6.0 oz/week    10 Cans of beer per week     Comment: daily   Drug Use: No   Sexual Activity: Not Currently   Other Topics Concern   Not on file   Social History Narrative      Review of Systems  Constitutional: Negative for fever and chills.  HENT: Negative for rhinorrhea and sore throat.   Eyes: Negative for visual disturbance.  Respiratory: Negative for cough and shortness of breath.   Cardiovascular: Negative for chest pain.  Gastrointestinal: Negative for nausea, vomiting, abdominal pain and diarrhea.  Genitourinary: Negative for dysuria.  Musculoskeletal: Positive for back pain. Negative for neck pain.  Skin: Negative for rash.  Neurological: Negative for headaches.  Psychiatric/Behavioral: Negative for confusion.     Objective:   BP 132/96 mmHg   Pulse 81   Temp(Src) 98.4 F (36.9 C)   Resp 16   Ht 5' 1.5" (1.562 m)   Wt 121 lb (54.885 kg)   BMI 22.50 kg/m2   SpO2 99%   Physical Exam  Constitutional: She is oriented to person, place, and time. She appears well-developed and well-nourished. No distress.  HENT:  Head: Normocephalic and atraumatic.  Eyes: Conjunctivae and EOM are normal.  Neck: Neck supple. No tracheal deviation present.  Cardiovascular: Normal rate.   Pulmonary/Chest: Effort normal. No respiratory distress.  Musculoskeletal: Normal range of motion.  Neurological: She is alert and oriented to person, place, and time.  Skin: Skin is warm and dry.  Psychiatric: She has a normal mood and affect. Her behavior is normal.  Nursing note and vitals reviewed.  Results for orders placed or performed in visit on 02/05/15  POCT UA - Microscopic Only  Result Value Ref Range   WBC, Ur, HPF, POC 2-3    RBC, urine, microscopic 4-7     Bacteria, U Microscopic 1+    Mucus, UA positive    Epithelial cells, urine per micros 2-3    Crystals, Ur, HPF, POC neg    Casts, Ur, LPF, POC neg    Yeast, UA neg   POCT urinalysis dipstick  Result Value Ref Range   Color, UA yellow    Clarity, UA clear    Glucose, UA neg    Bilirubin, UA neg    Ketones, UA neg    Spec Grav, UA >=1.030    Blood, UA moderate    pH, UA 5.0    Protein, UA neg    Urobilinogen, UA 0.2    Nitrite, UA neg    Leukocytes, UA Negative      Assessment & Plan:   Suggested that patient start swimming several times a week to help with her back pain.   This chart was scribed in my presence and reviewed by me personally.    ICD-9-CM ICD-10-CM   1. Borderline systolic HTN 796.2 R03.0 POCT UA - Microscopic Only     POCT urinalysis dipstick     CBC with Differential/Platelet     Comprehensive metabolic panel     Lipid panel     TSH  2. Anxiety state 300.00 F41.1 TSH     ALPRAZolam (XANAX) 0.5 MG tablet     escitalopram (LEXAPRO) 10 MG tablet  3. Dysuria 788.1 R30.0 POCT UA - Microscopic Only     POCT urinalysis dipstick     Urine culture     ciprofloxacin (CIPRO) 250 MG tablet  4. Right-sided low back pain with right-sided sciatica 724.3 M54.41 POCT UA - Microscopic Only   I will refill xanax in 3 - 4 months as necessary  Signed, Alexandra SidleKurt Lauenstein, MD

## 2015-02-05 NOTE — Patient Instructions (Signed)
Urinary Tract Infection Urinary tract infections (UTIs) can develop anywhere along your urinary tract. Your urinary tract is your body's drainage system for removing wastes and extra water. Your urinary tract includes two kidneys, two ureters, a bladder, and a urethra. Your kidneys are a pair of bean-shaped organs. Each kidney is about the size of your fist. They are located below your ribs, one on each side of your spine. CAUSES Infections are caused by microbes, which are microscopic organisms, including fungi, viruses, and bacteria. These organisms are so small that they can only be seen through a microscope. Bacteria are the microbes that most commonly cause UTIs. SYMPTOMS  Symptoms of UTIs may vary by age and gender of the patient and by the location of the infection. Symptoms in young women typically include a frequent and intense urge to urinate and a painful, burning feeling in the bladder or urethra during urination. Older women and men are more likely to be tired, shaky, and weak and have muscle aches and abdominal pain. A fever may mean the infection is in your kidneys. Other symptoms of a kidney infection include pain in your back or sides below the ribs, nausea, and vomiting. DIAGNOSIS To diagnose a UTI, your caregiver will ask you about your symptoms. Your caregiver also will ask to provide a urine sample. The urine sample will be tested for bacteria and white blood cells. White blood cells are made by your body to help fight infection. TREATMENT  Typically, UTIs can be treated with medication. Because most UTIs are caused by a bacterial infection, they usually can be treated with the use of antibiotics. The choice of antibiotic and length of treatment depend on your symptoms and the type of bacteria causing your infection. HOME CARE INSTRUCTIONS  If you were prescribed antibiotics, take them exactly as your caregiver instructs you. Finish the medication even if you feel better after you  have only taken some of the medication.  Drink enough water and fluids to keep your urine clear or pale yellow.  Avoid caffeine, tea, and carbonated beverages. They tend to irritate your bladder.  Empty your bladder often. Avoid holding urine for long periods of time.  Empty your bladder before and after sexual intercourse.  After a bowel movement, women should cleanse from front to back. Use each tissue only once. SEEK MEDICAL CARE IF:   You have back pain.  You develop a fever.  Your symptoms do not begin to resolve within 3 days. SEEK IMMEDIATE MEDICAL CARE IF:   You have severe back pain or lower abdominal pain.  You develop chills.  You have nausea or vomiting.  You have continued burning or discomfort with urination. MAKE SURE YOU:   Understand these instructions.  Will watch your condition.  Will get help right away if you are not doing well or get worse. Document Released: 07/13/2005 Document Revised: 04/03/2012 Document Reviewed: 11/11/2011 ExitCare Patient Information 2015 [employer]. This information is not intended to replace advice given to you by your health care provider. Make sure you discuss any questions you have with your health care provider. Generalized Anxiety Disorder Generalized anxiety disorder (GAD) is a mental disorder. It interferes with life functions, including relationships, work, and school. GAD is different from normal anxiety, which everyone experiences at some point in their lives in response to specific life events and activities. Normal anxiety actually helps us prepare for and get through these life events and activities. Normal anxiety goes away after the event or   activity is over.  GAD causes anxiety that is not necessarily related to specific events or activities. It also causes excess anxiety in proportion to specific events or activities. The anxiety associated with GAD is also difficult to control. GAD can vary from mild to  severe. People with severe GAD can have intense waves of anxiety with physical symptoms (panic attacks).  SYMPTOMS The anxiety and worry associated with GAD are difficult to control. This anxiety and worry are related to many life events and activities and also occur more days than not for 6 months or longer. People with GAD also have three or more of the following symptoms (one or more in children):  Restlessness.   Fatigue.  Difficulty concentrating.   Irritability.  Muscle tension.  Difficulty sleeping or unsatisfying sleep. DIAGNOSIS GAD is diagnosed through an assessment by your health care provider. Your health care provider will ask you questions aboutyour mood,physical symptoms, and events in your life. Your health care provider may ask you about your medical history and use of alcohol or drugs, including prescription medicines. Your health care provider may also do a physical exam and blood tests. Certain medical conditions and the use of certain substances can cause symptoms similar to those associated with GAD. Your health care provider may refer you to a mental health specialist for further evaluation. TREATMENT The following therapies are usually used to treat GAD:   Medication. Antidepressant medication usually is prescribed for long-term daily control. Antianxiety medicines may be added in severe cases, especially when panic attacks occur.   Talk therapy (psychotherapy). Certain types of talk therapy can be helpful in treating GAD by providing support, education, and guidance. A form of talk therapy called cognitive behavioral therapy can teach you healthy ways to think about and react to daily life events and activities.  Stress managementtechniques. These include yoga, meditation, and exercise and can be very helpful when they are practiced regularly. A mental health specialist can help determine which treatment is best for you. Some people see improvement with one  therapy. However, other people require a combination of therapies. Document Released: 01/28/2013 Document Revised: 02/17/2014 Document Reviewed: 01/28/2013 Exit[employer] Patient Information 2015 [employer]. This information is not intended to replace advice given to you by your health care provider. Make sure you discuss any questions you have with your health care provider.  

## 2015-02-06 LAB — COMPREHENSIVE METABOLIC PANEL
ALT: 8 U/L (ref 0–35)
AST: 19 U/L (ref 0–37)
Albumin: 4.2 g/dL (ref 3.5–5.2)
Alkaline Phosphatase: 39 U/L (ref 39–117)
BUN: 10 mg/dL (ref 6–23)
CO2: 25 mEq/L (ref 19–32)
Calcium: 9.1 mg/dL (ref 8.4–10.5)
Chloride: 100 mEq/L (ref 96–112)
Creat: 0.67 mg/dL (ref 0.50–1.10)
Glucose, Bld: 84 mg/dL (ref 70–99)
Potassium: 3.8 mEq/L (ref 3.5–5.3)
Sodium: 139 mEq/L (ref 135–145)
Total Bilirubin: 0.4 mg/dL (ref 0.2–1.2)
Total Protein: 7 g/dL (ref 6.0–8.3)

## 2015-02-06 LAB — LIPID PANEL
Cholesterol: 245 mg/dL — ABNORMAL HIGH (ref 0–200)
HDL: 78 mg/dL (ref >=46)
LDL Cholesterol: 118 mg/dL — ABNORMAL HIGH (ref 0–99)
Total CHOL/HDL Ratio: 3.1 Ratio
Triglycerides: 243 mg/dL — ABNORMAL HIGH (ref <150)
VLDL: 49 mg/dL — ABNORMAL HIGH (ref 0–40)

## 2015-02-06 LAB — TSH: TSH: 3.792 u[IU]/mL (ref 0.350–4.500)

## 2015-02-07 LAB — URINE CULTURE: Colony Count: >=100000

## 2015-08-17 ENCOUNTER — Ambulatory Visit (INDEPENDENT_AMBULATORY_CARE_PROVIDER_SITE_OTHER): Payer: No Typology Code available for payment source

## 2015-08-17 ENCOUNTER — Ambulatory Visit (INDEPENDENT_AMBULATORY_CARE_PROVIDER_SITE_OTHER): Payer: No Typology Code available for payment source | Admitting: Family Medicine

## 2015-08-17 VITALS — BP 128/88 | HR 101 | Temp 99.0°F | Resp 16 | Ht 61.5 in | Wt 124.2 lb

## 2015-08-17 DIAGNOSIS — F411 Generalized anxiety disorder: Secondary | ICD-10-CM | POA: Diagnosis not present

## 2015-08-17 DIAGNOSIS — I10 Essential (primary) hypertension: Secondary | ICD-10-CM | POA: Diagnosis not present

## 2015-08-17 DIAGNOSIS — J069 Acute upper respiratory infection, unspecified: Secondary | ICD-10-CM | POA: Diagnosis not present

## 2015-08-17 DIAGNOSIS — M25572 Pain in left ankle and joints of left foot: Secondary | ICD-10-CM

## 2015-08-17 MED ORDER — ALPRAZOLAM 0.5 MG PO TABS: 0.5000 mg | ORAL_TABLET | Freq: Every day | ORAL | Status: DC | PRN

## 2015-08-17 NOTE — Progress Notes (Signed)
Patient ID: Alexandra Dorsey, female    DOB: May 09, 1976, 39 y.o.   MRN: [id number]  PCP: No PCP Per Patient  Subjective:   Chief Complaint  Patient presents with  . Medication Refill    xanax  . Ankle Injury    twisted left ankle 11 days ago  . blood pressure check    checked over the weekend, top number wsa running in the 170 range    HPI Patient presents today for a BP check, med refill, and left ankle injury.   Takes 0.5mg  Xanax prn for anxiety. Will take 1/2 tablet during the day and a whole tablet at night once every 5 days. Tolerating well. Stopped taking escitalopram after 2 weeks because it made her feel worse. Requesting Xanax refill and a dose increase today.   Patient's BP today was initially elevated, but on repeat was improved to 128/88. Has previously tried lisinopril, which made her cough, and losartan, which kept her up at night. Last BP check was in April with Dr. Milus Glazier, was 132/96. Does not check BP regularly at home.   Patient also complains of left ankle pain, swelling, and bruising x 11 days. Was walking her American bulldog when she fell and twisted her ankle laterally. Patient states her ankle hurts all over, even in her toes. Has tried elevation and ice without much relief. No imaging studies obtained since the injury. Has a history of right ankle injuries and surgery a number of years ago. Very active. Works on her feet as a Leisure centre manager.  Finally, patient has had a cough, sore throat, and HA x 4 days. Unsure if it's related to her elevated BP or just a cold. Cough is worse in the AM and associated with mildly productive, yellow mucous. Denies fevers, chills, N/V.   No other concerns on today's visit.     Review of Systems Constitutional: Negative for chills.  HENT: Positive for congestion, ear pain (ear fullness, but no pain), sinus pressure and sore throat.  Respiratory: Positive for cough. Negative for wheezing.  Gastrointestinal: Negative for  nausea and vomiting.  Musculoskeletal: Positive for joint swelling (left ankle; associated with pain).  Skin: Positive for color change (left ankle bruising).  Neurological: Positive for headaches.  Psychiatric/Behavioral: Negative for sleep disturbance. The patient is nervous/anxious.    There are no active problems to display for this patient.    Prior to Admission medications   Medication Sig Start Date End Date Taking? Authorizing Provider  ALPRAZolam Prudy Feeler) 0.5 MG tablet Take 1 tablet (0.5 mg total) by mouth daily as needed for anxiety. 02/05/15  Yes Elvina Sidle, MD  escitalopram (LEXAPRO) 10 MG tablet Take 1 tablet (10 mg total) by mouth daily. Patient not taking: Reported on 08/17/2015 02/05/15   Elvina Sidle, MD     Allergies  Allergen Reactions  . Lisinopril Cough       Objective:  Physical Exam  Constitutional: She is oriented to person, place, and time. She appears well-developed and well-nourished. She is active and cooperative. No distress.  BP 128/88 mmHg  Pulse 101  Temp(Src) 99 F (37.2 C) (Oral)  Resp 16  Ht 5' 1.5" (1.562 m)  Wt 124 lb 3.2 oz (56.337 kg)  BMI 23.09 kg/m2  SpO2 99%  LMP 08/14/2015   Eyes: Conjunctivae are normal.  Pulmonary/Chest: Effort normal.  Musculoskeletal:       Left ankle: She exhibits swelling and ecchymosis. She exhibits normal range of motion, no laceration and normal pulse. Tenderness.  Lateral malleolus tenderness found. Achilles tendon normal.       Feet:  Neurological: She is alert and oriented to person, place, and time.  Psychiatric: She has a normal mood and affect. Her speech is normal and behavior is normal.       LEFT ankle: [facility] reading (PRIMARY) by  Dr. Conley RollsLe. Normal ankle.      Assessment & Plan:   1. Pain in joint, ankle and foot, left She has a lace-up brace at home that she prefers to use over ACE + air cast or CAM walker. Rehab exercises. Anticipatory guidance. - DG Ankle Complete Left;  Future  2. Essential hypertension Normal BP here today. No medication indicated. Re-evaluate in 6 months.  3. Anxiety state Tried multiple medications previously, most recently escitalopram, but didn't like how they made her feel. Took escitalopram only 2 weeks. As she only uses the alprazolam every 5 days or so, agree to continue the current dose. - ALPRAZolam (XANAX) 0.5 MG tablet; Take 1 tablet (0.5 mg total) by mouth daily as needed for anxiety.  Dispense: 30 tablet; Refill: 0  4. Viral URI Supportive care. Anticipatory guidance provided. RTC if symptoms worsen/persist.   Fernande Brashelle S. Hector Taft, PA-C Physician Assistant-Certified [facility]

## 2015-08-17 NOTE — Progress Notes (Signed)
Subjective:    Patient ID: Alexandra Dorsey, female    DOB: November 27, 1975, 39 y.o.   MRN: [id number]  Chief Complaint  Patient presents with  . Medication Refill    xanax  . Ankle Injury    twisted left ankle 11 days ago  . blood pressure check    checked over the weekend, top number wsa running in the 170 range   HPI Patient presents today for a BP check, med refill, and left ankle injury. Takes 0.5mg  Xanax prn for anxiety. Will take 1/2 tablet during the day and a whole tablet at night once every 5 days. Tolerating well. Stopped taking escitalopram after 2 weeks because it made her feel worse. Requesting Xanax refill and a dose increase today.   Patient's BP today was 128/88. Has previously tried lisinopril, which made her cough, and losartan, which kept her up at night. Last BP check was in April with Dr. Milus Glazier, was 132/96. Does not check BP regularly at home.   Patient also complains of left ankle pain, swelling, and bruising x 11 days. Was walking her American bulldog when she fell and twisted her ankle laterally. Patient states her ankle hurts all over, even in her toes. Has tried elevation and ice without much relief. No imaging studies obtained since the injury. Has a history of right ankle injuries and surgery a number of years ago. Very active.   Finally, patient has had a cough, sore throat, and HA x 4 days. Unsure if it's related to her elevated BP or just a cold. Cough is worse in the AM and associated with mildly productive, yellow mucous. Denies fevers, chills, N/V.   No other concerns on today's visit.   Review of Systems  Constitutional: Negative for chills.  HENT: Positive for congestion, ear pain (ear fullness, but no pain), sinus pressure and sore throat.   Respiratory: Positive for cough. Negative for wheezing.   Gastrointestinal: Negative for nausea and vomiting.  Musculoskeletal: Positive for joint swelling (left ankle; associated with pain).  Skin: Positive  for color change (left ankle bruising).  Neurological: Positive for headaches.  Psychiatric/Behavioral: Negative for sleep disturbance. The patient is nervous/anxious.    Past Medical History  Diagnosis Date  . Hypertension    Family History  Problem Relation Age of Onset  . Hypertension Mother   . Hypertension Father    Social History   Social History  . Marital Status: Married    Spouse Name: N/A  . Number of Children: N/A  . Years of Education: N/A   Occupational History  . Not on file.   Social History Main Topics  . Smoking status: Former Games developer  . Smokeless tobacco: Not on file  . Alcohol Use: 6.0 oz/week    10 Cans of beer per week     Comment: daily  . Drug Use: No  . Sexual Activity: Not Currently   Other Topics Concern  . Not on file   Social History Narrative   Prior to Admission medications   Medication Sig Start Date End Date Taking? Authorizing Provider  ALPRAZolam Prudy Feeler) 0.5 MG tablet Take 1 tablet (0.5 mg total) by mouth daily as needed for anxiety. 02/05/15  Yes Elvina Sidle, MD  ciprofloxacin (CIPRO) 250 MG tablet Take 1 tablet (250 mg total) by mouth 2 (two) times daily. Patient not taking: Reported on 08/17/2015 02/05/15   Elvina Sidle, MD  escitalopram (LEXAPRO) 10 MG tablet Take 1 tablet (10 mg total) by mouth daily.  Patient not taking: Reported on 08/17/2015 02/05/15   Elvina SidleKurt Lauenstein, MD   Allergies  Allergen Reactions  . Lisinopril Cough      Objective:   Physical Exam  Constitutional: She is oriented to person, place, and time. She appears well-developed and well-nourished. No distress.  BP 150/88 mmHg  Pulse 101  Temp(Src) 99 F (37.2 C) (Oral)  Resp 16  Ht 5' 1.5" (1.562 m)  Wt 124 lb 3.2 oz (56.337 kg)  BMI 23.09 kg/m2  SpO2 99%  LMP 08/14/2015  HENT:  Head: Normocephalic and atraumatic.  Nose: Nose normal.  Mouth/Throat: Oropharynx is clear and moist. No oropharyngeal exudate.  Neck: Normal range of motion. Neck  supple.  Pulmonary/Chest: Effort normal and breath sounds normal. No respiratory distress. She has no wheezes. She has no rales.  Musculoskeletal: Normal range of motion. She exhibits edema (left ankle moderate; associated with contusion). She exhibits no tenderness.  Large amount of bruising noted under patient's right upper arm and ribcage  Lymphadenopathy:    She has no cervical adenopathy.  Neurological: She is alert and oriented to person, place, and time.  Skin: Skin is warm and dry. No rash noted. She is not diaphoretic.  Psychiatric: She has a normal mood and affect. Her behavior is normal. Judgment and thought content normal.      Assessment & Plan:

## 2015-08-17 NOTE — Patient Instructions (Signed)
Get plenty of rest and drink at least 64 ounces of water daily. Use an OTC oral antihistamine (Like Allegra, Claritin or Zyrtec) and Mucinex Maximum Strength.  Acute Ankle Sprain With Phase I Rehab An acute ankle sprain is a partial or complete tear in one or more of the ligaments of the ankle due to traumatic injury. The severity of the injury depends on both the number of ligaments sprained and the grade of sprain. There are 3 grades of sprains.   A grade 1 sprain is a mild sprain. There is a slight pull without obvious tearing. There is no loss of strength, and the muscle and ligament are the correct length.  A grade 2 sprain is a moderate sprain. There is tearing of fibers within the substance of the ligament where it connects two bones or two cartilages. The length of the ligament is increased, and there is usually decreased strength.  A grade 3 sprain is a complete rupture of the ligament and is uncommon. In addition to the grade of sprain, there are three types of ankle sprains.  Lateral ankle sprains: This is a sprain of one or more of the three ligaments on the outer side (lateral) of the ankle. These are the most common sprains. Medial ankle sprains: There is one large triangular ligament of the inner side (medial) of the ankle that is susceptible to injury. Medial ankle sprains are less common. Syndesmosis, "high ankle," sprains: The syndesmosis is the ligament that connects the two bones of the lower leg. Syndesmosis sprains usually only occur with very severe ankle sprains. SYMPTOMS  Pain, tenderness, and swelling in the ankle, starting at the side of injury that may progress to the whole ankle and foot with time.  "Pop" or tearing sensation at the time of injury.  Bruising that may spread to the heel.  Impaired ability to walk soon after injury. CAUSES   Acute ankle sprains are caused by trauma placed on the ankle that temporarily forces or pries the anklebone (talus) out of  its normal socket.  Stretching or tearing of the ligaments that normally hold the joint in place (usually due to a twisting injury). RISK INCREASES WITH:  Previous ankle sprain.  Sports in which the foot may land awkwardly (i.e., basketball, volleyball, or soccer) or walking or running on uneven or rough surfaces.  Shoes with inadequate support to prevent sideways motion when stress occurs.  Poor strength and flexibility.  Poor balance skills.  Contact sports. PREVENTION   Warm up and stretch properly before activity.  Maintain physical fitness:  Ankle and leg flexibility, muscle strength, and endurance.  Cardiovascular fitness.  Balance training activities.  Use proper technique and have a coach correct improper technique.  Taping, protective strapping, bracing, or high-top tennis shoes may help prevent injury. Initially, tape is best; however, it loses most of its support function within 10 to 15 minutes.  Wear proper-fitted protective shoes (High-top shoes with taping or bracing is more effective than either alone).  Provide the ankle with support during sports and practice activities for 12 months following injury. PROGNOSIS   If treated properly, ankle sprains can be expected to recover completely; however, the length of recovery depends on the degree of injury.  A grade 1 sprain usually heals enough in 5 to 7 days to allow modified activity and requires an average of 6 weeks to heal completely.  A grade 2 sprain requires 6 to 10 weeks to heal completely.  A grade 3 sprain  requires 12 to 16 weeks to heal.  A syndesmosis sprain often takes more than 3 months to heal. RELATED COMPLICATIONS   Frequent recurrence of symptoms may result in a chronic problem. Appropriately addressing the problem the first time decreases the frequency of recurrence and optimizes healing time. Severity of the initial sprain does not predict the likelihood of later instability.  Injury  to other structures (bone, cartilage, or tendon).  A chronically unstable or arthritic ankle joint is a possibility with repeated sprains. TREATMENT Treatment initially involves the use of ice, medication, and compression bandages to help reduce pain and inflammation. Ankle sprains are usually immobilized in a walking cast or boot to allow for healing. Crutches may be recommended to reduce pressure on the injury. After immobilization, strengthening and stretching exercises may be necessary to regain strength and a full range of motion. Surgery is rarely needed to treat ankle sprains. MEDICATION   Nonsteroidal anti-inflammatory medications, such as aspirin and ibuprofen (do not take for the first 3 days after injury or within 7 days before surgery), or other minor pain relievers, such as acetaminophen, are often recommended. Take these as directed by your caregiver. Contact your caregiver immediately if any bleeding, stomach upset, or signs of an allergic reaction occur from these medications.  Ointments applied to the skin may be helpful.  Pain relievers may be prescribed as necessary by your caregiver. Do not take prescription pain medication for longer than 4 to 7 days. Use only as directed and only as much as you need. HEAT AND COLD  Cold treatment (icing) is used to relieve pain and reduce inflammation for acute and chronic cases. Cold should be applied for 10 to 15 minutes every 2 to 3 hours for inflammation and pain and immediately after any activity that aggravates your symptoms. Use ice packs or an ice massage.  Heat treatment may be used before performing stretching and strengthening activities prescribed by your caregiver. Use a heat pack or a warm soak. SEEK IMMEDIATE MEDICAL CARE IF:   Pain, swelling, or bruising worsens despite treatment.  You experience pain, numbness, discoloration, or coldness in the foot or toes.  New, unexplained symptoms develop (drugs used in treatment may  produce side effects.) EXERCISES  PHASE I EXERCISES RANGE OF MOTION (ROM) AND STRETCHING EXERCISES - Ankle Sprain, Acute Phase I, Weeks 1 to 2 These exercises may help you when beginning to restore flexibility in your ankle. You will likely work on these exercises for the 1 to 2 weeks after your injury. Once your physician, physical therapist, or athletic trainer sees adequate progress, he or she will advance your exercises. While completing these exercises, remember:   Restoring tissue flexibility helps normal motion to return to the joints. This allows healthier, less painful movement and activity.  An effective stretch should be held for at least 30 seconds.  A stretch should never be painful. You should only feel a gentle lengthening or release in the stretched tissue. RANGE OF MOTION - Dorsi/Plantar Flexion  While sitting with your right / left knee straight, draw the top of your foot upwards by flexing your ankle. Then reverse the motion, pointing your toes downward.  Hold each position for __________ seconds.  After completing your first set of exercises, repeat this exercise with your knee bent. Repeat __________ times. Complete this exercise __________ times per day.  RANGE OF MOTION - Ankle Alphabet  Imagine your right / left big toe is a pen.  Keeping your hip and knee  still, write out the entire alphabet with your "pen." Make the letters as large as you can without increasing any discomfort. Repeat __________ times. Complete this exercise __________ times per day.  STRENGTHENING EXERCISES - Ankle Sprain, Acute -Phase I, Weeks 1 to 2 These exercises may help you when beginning to restore strength in your ankle. You will likely work on these exercises for 1 to 2 weeks after your injury. Once your physician, physical therapist, or athletic trainer sees adequate progress, he or she will advance your exercises. While completing these exercises, remember:   Muscles can gain both the  endurance and the strength needed for everyday activities through controlled exercises.  Complete these exercises as instructed by your physician, physical therapist, or athletic trainer. Progress the resistance and repetitions only as guided.  You may experience muscle soreness or fatigue, but the pain or discomfort you are trying to eliminate should never worsen during these exercises. If this pain does worsen, stop and make certain you are following the directions exactly. If the pain is still present after adjustments, discontinue the exercise until you can discuss the trouble with your clinician. STRENGTH - Dorsiflexors  Secure a rubber exercise band/tubing to a fixed object (i.e., table, pole) and loop the other end around your right / left foot.  Sit on the floor facing the fixed object. The band/tubing should be slightly tense when your foot is relaxed.  Slowly draw your foot back toward you using your ankle and toes.  Hold this position for __________ seconds. Slowly release the tension in the band and return your foot to the starting position. Repeat __________ times. Complete this exercise __________ times per day.  STRENGTH - Plantar-flexors   Sit with your right / left leg extended. Holding onto both ends of a rubber exercise band/tubing, loop it around the ball of your foot. Keep a slight tension in the band.  Slowly push your toes away from you, pointing them downward.  Hold this position for __________ seconds. Return slowly, controlling the tension in the band/tubing. Repeat __________ times. Complete this exercise __________ times per day.  STRENGTH - Ankle Eversion  Secure one end of a rubber exercise band/tubing to a fixed object (table, pole). Loop the other end around your foot just before your toes.  Place your fists between your knees. This will focus your strengthening at your ankle.  Drawing the band/tubing across your opposite foot, slowly, pull your little toe  out and up. Make sure the band/tubing is positioned to resist the entire motion.  Hold this position for __________ seconds. Have your muscles resist the band/tubing as it slowly pulls your foot back to the starting position.  Repeat __________ times. Complete this exercise __________ times per day.  STRENGTH - Ankle Inversion  Secure one end of a rubber exercise band/tubing to a fixed object (table, pole). Loop the other end around your foot just before your toes.  Place your fists between your knees. This will focus your strengthening at your ankle.  Slowly, pull your big toe up and in, making sure the band/tubing is positioned to resist the entire motion.  Hold this position for __________ seconds.  Have your muscles resist the band/tubing as it slowly pulls your foot back to the starting position. Repeat __________ times. Complete this exercises __________ times per day.  STRENGTH - Towel Curls  Sit in a chair positioned on a non-carpeted surface.  Place your right / left foot on a towel, keeping your heel on  the floor.  Pull the towel toward your heel by only curling your toes. Keep your heel on the floor.  If instructed by your physician, physical therapist, or athletic trainer, add weight to the end of the towel. Repeat __________ times. Complete this exercise __________ times per day.   This information is not intended to replace advice given to you by your health care provider. Make sure you discuss any questions you have with your health care provider.   Document Released: 05/04/2005 Document Revised: 10/24/2014 Document Reviewed: 01/15/2009 Elsevier Interactive Patient Education [employer]

## 2015-08-18 NOTE — Progress Notes (Signed)
Agree with A/P. Dr Field Staniszewski 

## 2015-11-29 ENCOUNTER — Encounter (HOSPITAL_BASED_OUTPATIENT_CLINIC_OR_DEPARTMENT_OTHER): Payer: Self-pay | Admitting: Emergency Medicine

## 2015-11-29 ENCOUNTER — Emergency Department (HOSPITAL_BASED_OUTPATIENT_CLINIC_OR_DEPARTMENT_OTHER)
Admission: EM | Admit: 2015-11-29 | Discharge: 2015-11-29 | Discharge status: Against Medical Advice | Payer: BLUE CROSS/BLUE SHIELD | Attending: Emergency Medicine | Admitting: Emergency Medicine

## 2015-11-29 DIAGNOSIS — Y9389 Activity, other specified: Secondary | ICD-10-CM | POA: Insufficient documentation

## 2015-11-29 DIAGNOSIS — F419 Anxiety disorder, unspecified: Secondary | ICD-10-CM | POA: Diagnosis not present

## 2015-11-29 DIAGNOSIS — Z87891 Personal history of nicotine dependence: Secondary | ICD-10-CM | POA: Insufficient documentation

## 2015-11-29 DIAGNOSIS — S61012A Laceration without foreign body of left thumb without damage to nail, initial encounter: Secondary | ICD-10-CM | POA: Insufficient documentation

## 2015-11-29 DIAGNOSIS — W260XXA Contact with knife, initial encounter: Secondary | ICD-10-CM | POA: Diagnosis not present

## 2015-11-29 DIAGNOSIS — I10 Essential (primary) hypertension: Secondary | ICD-10-CM | POA: Diagnosis not present

## 2015-11-29 DIAGNOSIS — Y9289 Other specified places as the place of occurrence of the external cause: Secondary | ICD-10-CM | POA: Diagnosis not present

## 2015-11-29 DIAGNOSIS — Z23 Encounter for immunization: Secondary | ICD-10-CM | POA: Insufficient documentation

## 2015-11-29 DIAGNOSIS — Y998 Other external cause status: Secondary | ICD-10-CM | POA: Diagnosis not present

## 2015-11-29 MED ORDER — TETANUS-DIPHTH-ACELL PERTUSSIS 5-2.5-18.5 LF-MCG/0.5 IM SUSP
0.5000 mL | Freq: Once | INTRAMUSCULAR | Status: AC
  Administered 2015-11-29: 0.5 mL via INTRAMUSCULAR
  Filled 2015-11-29: qty 0.5

## 2015-11-29 NOTE — ED Notes (Signed)
Dr. Nicanor Alcon at [facility], lac thumb cleaned after soaking. lac sealed with dermabond and wound seal powder. Gauze and coban applied.

## 2015-11-29 NOTE — ED Notes (Signed)
Patient states that she was cutting a lime and she cut the tip of her left thumb off about 2 hours ago. Patient reports that she has been drinking and they tried to fix it at home but was unable to get the bleeding stopped. Patient is very anxious and somewhat angry to answer questions. While trying to triage the patient the patient is using inappropriate language and crying because this RN is not working fast enough.

## 2015-11-29 NOTE — ED Provider Notes (Signed)
CSN: [id number]     Arrival date & time 11/29/15  0032 History  By signing my name below, I, Budd Palmer, attest that this documentation has been prepared under the direction and in the presence of Anaalicia Reimann, MD. Electronically Signed: Budd Palmer, ED Scribe. 11/29/2015. 1:02 AM.   Chief Complaint  Patient presents with  . Laceration   Patient is a 40 y.o. female presenting with skin laceration. The history is provided by the patient. No language interpreter was used.  Laceration Location:  Hand Hand laceration location:  L finger Length (cm):  0.9 Depth:  Cutaneous Quality: straight   Bleeding: controlled   Time since incident:  2 hours Laceration mechanism:  Knife Pain details:    Severity:  No pain Foreign body present:  No foreign bodies Relieved by:  None tried Worsened by:  Nothing tried Ineffective treatments:  None tried Tetanus status:  Unknown  HPI Comments: Alexandra Dorsey is a 40 y.o. female former smoker with a PMHx of HTN who presents to the Emergency Department complaining of a laceration to the left thumb sustained 2 hours ago. Pt states she was trying to cut a line when she slipped, cutting her thumb. She does not recall her last tetanus shot. She notes she has been drinking tonight.    Past Medical History  Diagnosis Date  . Hypertension    History reviewed. No pertinent past surgical history. Family History  Problem Relation Age of Onset  . Hypertension Mother   . Hypertension Father    Social History  Substance Use Topics  . Smoking status: Former Games developer  . Smokeless tobacco: None  . Alcohol Use: 6.0 oz/week    10 Cans of beer per week     Comment: daily   OB History    No data available     Review of Systems  Skin: Positive for wound.  All other systems reviewed and are negative.   Allergies  Lisinopril  Home Medications   Prior to Admission medications   Medication Sig Start Date End Date Taking? Authorizing Provider   ALPRAZolam Prudy Feeler) 0.5 MG tablet Take 1 tablet (0.5 mg total) by mouth daily as needed for anxiety. 08/17/15   Chelle Jeffery, PA-C  escitalopram (LEXAPRO) 10 MG tablet Take 1 tablet (10 mg total) by mouth daily. Patient not taking: Reported on 08/17/2015 02/05/15   Elvina Sidle, MD   BP 148/127 mmHg  Pulse 136  Temp(Src) 98.6 F (37 C) (Oral)  Resp 18  Ht  (1.549 m)  Wt 128 lb (58.06 kg)  BMI 24.20 kg/m2  SpO2 98%  LMP 11/18/2015 Physical Exam  Constitutional: She appears well-developed and well-nourished.  HENT:  Head: Normocephalic and atraumatic.  Mouth/Throat: Oropharynx is clear and moist.  Eyes: Conjunctivae are normal. Pupils are equal, round, and reactive to light. Right eye exhibits no discharge. Left eye exhibits no discharge.  Neck: Normal range of motion. Neck supple.  Cardiovascular: Normal rate, regular rhythm and normal heart sounds.   Pulmonary/Chest: Effort normal and breath sounds normal. No respiratory distress. She has no wheezes. She has no rales.  Abdominal: Soft. Bowel sounds are normal. There is no tenderness. There is no rebound and no guarding.  Musculoskeletal: Normal range of motion.       Left hand: She exhibits normal capillary refill. Normal sensation noted. Normal strength noted.       Hands: Neurological: She is alert. Coordination normal.  Skin: Skin is warm and dry. No rash noted.  She is not diaphoretic. No erythema.  Psychiatric: Her mood appears anxious. Her affect is angry and labile. She is combative.  Nursing note and vitals reviewed.   ED Course  Procedures  DIAGNOSTIC STUDIES: Oxygen Saturation is 98% on RA, normal by my interpretation.    COORDINATION OF CARE: 12:58 AM - Discussed plans to dermabond the wound. Pt advised of plan for treatment and pt agrees.   Labs Review Labs Reviewed - No data to display  Imaging Review No results found. I have personally reviewed and evaluated these images and lab results as part of  my medical decision-making.   EKG Interpretation None      MDM   Final diagnoses:  None    Patient belligerent and making inappropriate comments. Stating she is normally nice but is drunk.  Wants her whole hand cleansed.   Wound sealer applied and pressure dressing to stop bleeding  Intended dermabond but patient eloped  I personally performed the services described in this documentation, which was scribed in my presence. The recorded information has been reviewed and is accurate.    Cy Blamer, MD 11/29/15 [phone]

## 2015-11-29 NOTE — ED Notes (Signed)
Pt, family, and belongings not in room, pt reported to have walked out, did not keep VIS sheet or wait for formal d/c after finger wound closed.

## 2015-11-29 NOTE — ED Notes (Addendum)
Pt states, "finished soaking thumb in betadine". Betadine spilled at [facility]. Pt manic, animated, jovial. Family at [facility].

## 2015-12-04 ENCOUNTER — Other Ambulatory Visit: Payer: Self-pay

## 2015-12-04 DIAGNOSIS — F411 Generalized anxiety disorder: Secondary | ICD-10-CM

## 2015-12-04 NOTE — Telephone Encounter (Signed)
Patient requesting a refill on "Alprazolam" and for it to be sent to [facility] on [address]. Patients call back number is [phone].

## 2015-12-07 MED ORDER — ALPRAZOLAM 0.5 MG PO TABS: 0.5000 mg | ORAL_TABLET | Freq: Every day | ORAL | Status: DC | PRN

## 2015-12-07 NOTE — Telephone Encounter (Signed)
Faxed RF and notified pt on VM. 

## 2015-12-07 NOTE — Telephone Encounter (Signed)
Meds ordered this encounter  Medications  . ALPRAZolam (XANAX) 0.5 MG tablet    Sig: Take 1 tablet (0.5 mg total) by mouth daily as needed for anxiety.    Dispense:  30 tablet    Refill:  0

## 2015-12-14 ENCOUNTER — Ambulatory Visit: Payer: BLUE CROSS/BLUE SHIELD | Admitting: Family Medicine

## 2016-01-01 ENCOUNTER — Telehealth: Payer: Self-pay | Admitting: Behavioral Health

## 2016-01-01 NOTE — Telephone Encounter (Signed)
Attempted to reach patient for Pre-Visit Call. Unable to leave a message at this time.   

## 2016-01-04 ENCOUNTER — Telehealth: Payer: Self-pay | Admitting: Family Medicine

## 2016-01-04 ENCOUNTER — Ambulatory Visit: Payer: BLUE CROSS/BLUE SHIELD | Admitting: Family Medicine

## 2016-01-04 NOTE — Telephone Encounter (Signed)
Pt lvm at 8:07 stating that she has an important meeting at work that she can't miss. Pt has appt this morning at 9:30a.

## 2016-01-05 ENCOUNTER — Encounter: Payer: Self-pay | Admitting: Family Medicine

## 2016-01-05 NOTE — Telephone Encounter (Signed)
-----   Message from Pearline CablesJessica C Copland, MD sent at 01/05/2016 10:06 AM EDT ----- No charge but please send a warning letter

## 2016-01-05 NOTE — Telephone Encounter (Signed)
Sent letter to waive fee

## 2016-01-05 NOTE — Telephone Encounter (Signed)
Charge or no charge? °

## 2016-02-29 ENCOUNTER — Other Ambulatory Visit: Payer: Self-pay | Admitting: Family Medicine

## 2016-07-18 ENCOUNTER — Other Ambulatory Visit: Payer: Self-pay | Admitting: Family Medicine

## 2016-07-19 ENCOUNTER — Other Ambulatory Visit: Payer: Self-pay | Admitting: Family Medicine

## 2016-08-06 ENCOUNTER — Encounter: Payer: Self-pay | Admitting: Family Medicine

## 2016-08-06 ENCOUNTER — Ambulatory Visit (INDEPENDENT_AMBULATORY_CARE_PROVIDER_SITE_OTHER): Payer: BLUE CROSS/BLUE SHIELD | Admitting: Family Medicine

## 2016-08-06 VITALS — BP 130/98 | HR 69 | Temp 98.3°F | Resp 18 | Ht 61.75 in | Wt 126.2 lb

## 2016-08-06 DIAGNOSIS — F418 Other specified anxiety disorders: Secondary | ICD-10-CM | POA: Diagnosis not present

## 2016-08-06 DIAGNOSIS — Z13220 Encounter for screening for lipoid disorders: Secondary | ICD-10-CM | POA: Diagnosis not present

## 2016-08-06 DIAGNOSIS — I10 Essential (primary) hypertension: Secondary | ICD-10-CM | POA: Diagnosis not present

## 2016-08-06 MED ORDER — AMLODIPINE BESYLATE 5 MG PO TABS: 5.0000 mg | ORAL_TABLET | Freq: Every day | ORAL | 1 refills | Status: DC

## 2016-08-06 MED ORDER — ALPRAZOLAM 0.5 MG PO TABS: 0.2500 mg | ORAL_TABLET | Freq: Two times a day (BID) | ORAL | 0 refills | Status: DC | PRN

## 2016-08-06 NOTE — Patient Instructions (Addendum)
Start amlodipine, 1 pill once per day and pressure. Also cut back to no more than 1-2 drinks of alcohol per day as that is also likely contributing to some elevated readings. Let me know if you have any difficulty cutting back on alcohol.  See information below on stress and stress management, I refilled Xanax as needed for breakthrough symptoms, but if you're requiring higher doses at night to help with sleep or more frequent doses of that medication, meeting with a therapist or considering another SSRI medication for anxiety may be beneficial. Follow-up with me to discuss those if that is the case.  Return to the clinic or go to the nearest emergency room if any of your symptoms worsen or new symptoms occur.   Hypertension Hypertension, commonly called high blood pressure, is when the force of blood pumping through your arteries is too strong. Your arteries are the blood vessels that carry blood from your heart throughout your body. A blood pressure reading consists of a higher number over a lower number, such as 110/72. The higher number (systolic) is the pressure inside your arteries when your heart pumps. The lower number (diastolic) is the pressure inside your arteries when your heart relaxes. Ideally you want your blood pressure below 120/80. Hypertension forces your heart to work harder to pump blood. Your arteries may become narrow or stiff. Having untreated or uncontrolled hypertension can cause heart attack, stroke, kidney disease, and other problems. RISK FACTORS Some risk factors for high blood pressure are controllable. Others are not.  Risk factors you cannot control include:   Race. You may be at higher risk if you are African American.  Age. Risk increases with age.  Gender. Men are at higher risk than women before age 23 years. After age 46, women are at higher risk than men. Risk factors you can control include:  Not getting enough exercise or physical activity.  Being  overweight.  Getting too much fat, sugar, calories, or salt in your diet.  Drinking too much alcohol. SIGNS AND SYMPTOMS Hypertension does not usually cause signs or symptoms. Extremely high blood pressure (hypertensive crisis) may cause headache, anxiety, shortness of breath, and nosebleed. DIAGNOSIS To check if you have hypertension, your health care provider will measure your blood pressure while you are seated, with your arm held at the level of your heart. It should be measured at least twice using the same arm. Certain conditions can cause a difference in blood pressure between your right and left arms. A blood pressure reading that is higher than normal on one occasion does not mean that you need treatment. If it is not clear whether you have high blood pressure, you may be asked to return on a different day to have your blood pressure checked again. Or, you may be asked to monitor your blood pressure at home for 1 or more weeks. TREATMENT Treating high blood pressure includes making lifestyle changes and possibly taking medicine. Living a healthy lifestyle can help lower high blood pressure. You may need to change some of your habits. Lifestyle changes may include:  Following the DASH diet. This diet is high in fruits, vegetables, and whole grains. It is low in salt, red meat, and added sugars.  Keep your sodium intake below 2,300 mg per day.  Getting at least 30-45 minutes of aerobic exercise at least 4 times per week.  Losing weight if necessary.  Not smoking.  Limiting alcoholic beverages.  Learning ways to reduce stress. Your health care  provider may prescribe medicine if lifestyle changes are not enough to get your blood pressure under control, and if one of the following is true:  You are 54-69 years of age and your systolic blood pressure is above 140.  You are 31 years of age or older, and your systolic blood pressure is above 150.  Your diastolic blood pressure is  above 90.  You have diabetes, and your systolic blood pressure is over 096 or your diastolic blood pressure is over 90.  You have kidney disease and your blood pressure is above 140/90.  You have heart disease and your blood pressure is above 140/90. Your personal target blood pressure may vary depending on your medical conditions, your age, and other factors. HOME CARE INSTRUCTIONS  Have your blood pressure rechecked as directed by your health care provider.   Take medicines only as directed by your health care provider. Follow the directions carefully. Blood pressure medicines must be taken as prescribed. The medicine does not work as well when you skip doses. Skipping doses also puts you at risk for problems.  Do not smoke.   Monitor your blood pressure at home as directed by your health care provider. SEEK MEDICAL CARE IF:   You think you are having a reaction to medicines taken.  You have recurrent headaches or feel dizzy.  You have swelling in your ankles.  You have trouble with your vision. SEEK IMMEDIATE MEDICAL CARE IF:  You develop a severe headache or confusion.  You have unusual weakness, numbness, or feel faint.  You have severe chest or abdominal pain.  You vomit repeatedly.  You have trouble breathing. MAKE SURE YOU:   Understand these instructions.  Will watch your condition.  Will get help right away if you are not doing well or get worse.   This information is not intended to replace advice given to you by your health care provider. Make sure you discuss any questions you have with your health care provider.   Document Released: 10/03/2005 Document Revised: 02/17/2015 Document Reviewed: 07/26/2013 Elsevier Interactive Patient Education 2016 [place] and Stress Management Stress is a normal reaction to life events. It is what you feel when life demands more than you are used to or more than you can handle. Some stress can be useful.  For example, the stress reaction can help you catch the last bus of the day, study for a test, or meet a deadline at work. But stress that occurs too often or for too long can cause problems. It can affect your emotional health and interfere with relationships and normal daily activities. Too much stress can weaken your immune system and increase your risk for physical illness. If you already have a medical problem, stress can make it worse. CAUSES  All sorts of life events may cause stress. An event that causes stress for one person may not be stressful for another person. Major life events commonly cause stress. These may be positive or negative. Examples include losing your job, moving into a new home, getting married, having a baby, or losing a loved one. Less obvious life events may also cause stress, especially if they occur day after day or in combination. Examples include working long hours, driving in traffic, caring for children, being in debt, or being in a difficult relationship. SIGNS AND SYMPTOMS Stress may cause emotional symptoms including, the following:  Anxiety. This is feeling worried, afraid, on edge, overwhelmed, or out of control.  Anger. This  is feeling irritated or impatient.  Depression. This is feeling sad, down, helpless, or guilty.  Difficulty focusing, remembering, or making decisions. Stress may cause physical symptoms, including the following:   Aches and pains. These may affect your head, neck, back, stomach, or other areas of your body.  Tight muscles or clenched jaw.  Low energy or trouble sleeping. Stress may cause unhealthy behaviors, including the following:   Eating to feel better (overeating) or skipping meals.  Sleeping too little, too much, or both.  Working too much or putting off tasks (procrastination).  Smoking, drinking alcohol, or using drugs to feel better. DIAGNOSIS  Stress is diagnosed through an assessment by your health care  provider. Your health care provider will ask questions about your symptoms and any stressful life events.Your health care provider will also ask about your medical history and may order blood tests or other tests. Certain medical conditions and medicine can cause physical symptoms similar to stress. Mental illness can cause emotional symptoms and unhealthy behaviors similar to stress. Your health care provider may refer you to a mental health professional for further evaluation.  TREATMENT  Stress management is the recommended treatment for stress.The goals of stress management are reducing stressful life events and coping with stress in healthy ways.  Techniques for reducing stressful life events include the following:  Stress identification. Self-monitor for stress and identify what causes stress for you. These skills may help you to avoid some stressful events.  Time management. Set your priorities, keep a calendar of events, and learn to say "no." These tools can help you avoid making too many commitments. Techniques for coping with stress include the following:  Rethinking the problem. Try to think realistically about stressful events rather than ignoring them or overreacting. Try to find the positives in a stressful situation rather than focusing on the negatives.  Exercise. Physical exercise can release both physical and emotional tension. The key is to find a form of exercise you enjoy and do it regularly.  Relaxation techniques. These relax the body and mind. Examples include yoga, meditation, tai chi, biofeedback, deep breathing, progressive muscle relaxation, listening to music, being out in nature, journaling, and other hobbies. Again, the key is to find one or more that you enjoy and can do regularly.  Healthy lifestyle. Eat a balanced diet, get plenty of sleep, and do not smoke. Avoid using alcohol or drugs to relax.  Strong support network. Spend time with family, friends, or  other people you enjoy being around.Express your feelings and talk things over with someone you trust. Counseling or talktherapy with a mental health professional may be helpful if you are having difficulty managing stress on your own. Medicine is typically not recommended for the treatment of stress.Talk to your health care provider if you think you need medicine for symptoms of stress. HOME CARE INSTRUCTIONS  Keep all follow-up visits as directed by your health care provider.  Take all medicines as directed by your health care provider. SEEK MEDICAL CARE IF:  Your symptoms get worse or you start having new symptoms.  You feel overwhelmed by your problems and can no longer manage them on your own. SEEK IMMEDIATE MEDICAL CARE IF:  You feel like hurting yourself or someone else.   This information is not intended to replace advice given to you by your health care provider. Make sure you discuss any questions you have with your health care provider.   Document Released: 03/29/2001 Document Revised: 10/24/2014 Document Reviewed: 05/28/2013  Elsevier Interactive Patient Education [employer].   IF you received an x-ray today, you will receive an invoice from [facility]. Please contact [facility] at [phone] with questions or concerns regarding your invoice.   IF you received labwork today, you will receive an invoice from [employer]. Please contact [facility] at [phone] with questions or concerns regarding your invoice.   Our billing staff will not be able to assist you with questions regarding bills from these companies.  You will be contacted with the lab results as soon as they are available. The fastest way to get your results is to activate your My Chart account. Instructions are located on the last page of this paperwork. If you have not heard from [place] regarding the results in 2 weeks, please contact this office.

## 2016-08-06 NOTE — Progress Notes (Signed)
Subjective:  By signing my name below, I, Alexandra Dorsey, attest that this documentation has been prepared under the direction and in the presence of Alexandra Ray, MD. Electronically Signed: Moises Dorsey, [place]. 08/06/2016 , 11:24 AM .  Patient was seen in Room 11 .   Patient ID: Alexandra Dorsey, female    DOB: 29-Aug-1976, 40 y.o.   MRN: [id number] Chief Complaint  Patient presents with  . Hypertension    Pt has personal BP Machine with her, reading on her machine was 138/108.  . Medication Refill    Xanax, Increase dosage   HPI Alexandra Dorsey is a 40 y.o. female Here for HTN, as well as to discuss xanax. Previously seen by Dr. Marin Dorsey in Oct 2016. She is fasting today.   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 She has occasional alcohol, average 4 beers a day. She denies problems with DUI in the past.   Anxiety Last discussed in Oct 2016. She was on xanax 0.53m prn with half tablet during the day, and 1 tablet at night, approximately once every 5 days. She had been treated with lexapro but stopped after 2 weeks as it made her feel worse. Last prescription of xanax 0.568mfor #30 on May 17th, 2017.   Patient notes she's been on 30 pills of xanax and lasted since May 17th. Her mother has been in the hospital recently with CHF. She hasn't talked to a counselor or therapist yet. She does mention having some tingling in her left hand.   Elevated BP At last visit in 2016, her BP initially was elevated but on repeat, it came down to 128/88. She was intolerant to lisinopril due to cough and losartan due to difficulty going back to sleep based on previous visits. She presents today with her own BP machine with 138/108 reading. She has family history of HTN on both maternal and paternal sides.   Patient hasn't tried any other BP medication after lisinopril or losartan. Her highest reading was 159/110 recently. She has a home BP machine, with upper arm cuff. She mentions  having headaches with left eye pressure. She hasn't seen eye doctor for past 2 years. She denies chest pain, shortness of breath or chronic cough.   Fall - injury to shin She mentions hitting her left shin about 3 weeks ago when she tripped while going up stairs. She has left lower leg contusion with swelling and bruising for 2 weeks.   There are no active problems to display for this patient.  Past Medical History:  Diagnosis Date  . Hypertension    No past surgical history on file. Allergies  Allergen Reactions  . Lisinopril Cough   Prior to Admission medications   Medication Sig Start Date End Date Taking? Authorizing Provider  ALPRAZolam (XDuanne Moron0.5 MG tablet take 1 tablet by mouth once daily if needed for anxiety 03/02/16  Yes KuRobyn HaberMD   Social History   Social History  . Marital status: Married    Spouse name: N/A  . Number of children: N/A  . Years of education: N/A   Occupational History  . Not on file.   Social History Main Topics  . Smoking status: Former SmResearch scientist (life sciences). Smokeless tobacco: Not on file  . Alcohol use 6.0 oz/week    10 Cans of beer per week     Dorsey: daily  . Drug use: No  . Sexual activity: Not Currently   Other Topics Concern  .  Not on file   Social History Narrative  . No narrative on file   Review of Systems  Constitutional: Negative for fatigue and unexpected weight change.  Eyes: Positive for visual disturbance.  Respiratory: Negative for chest tightness and shortness of breath.   Cardiovascular: Negative for chest pain, palpitations and leg swelling.  Gastrointestinal: Negative for abdominal pain and Dorsey in stool.  Skin: Positive for wound. Negative for rash.  Neurological: Positive for headaches. Negative for dizziness, syncope and light-headedness.       Objective:   Physical Exam  Constitutional: She is oriented to person, place, and time. She appears well-developed and well-nourished.  HENT:  Head: Normocephalic  and atraumatic.  Eyes: Conjunctivae and EOM are normal. Pupils are equal, round, and reactive to light.  Neck: Carotid bruit is not present.  Cardiovascular: Normal rate, regular rhythm, normal heart sounds and intact distal pulses.   Pulmonary/Chest: Effort normal and breath sounds normal.  Abdominal: Soft. She exhibits no pulsatile midline mass. There is no tenderness.  Musculoskeletal: She exhibits no edema (lower extremity).  Dependent ecchymosis over left lower leg into her left ankle, some tenderness over a soft tissue swelling, swollen area at the lower 3rd anterior tibia prominence, ankles non tender, NVI distally  Neurological: She is alert and oriented to person, place, and time.  Skin: Skin is warm and dry.  Psychiatric: She has a normal mood and affect. Her behavior is normal.  Vitals reviewed.   Vitals:   08/06/16 1036 08/06/16 1127  BP: (!) 150/92 (!) 130/98  Pulse: 69   Resp: 18   Temp: 98.3 F (36.8 C)   TempSrc: Oral   SpO2: 99%   Weight: 126 lb 3.2 oz (57.2 kg)   Height: 5' 1.75" (1.568 m)       Assessment & Plan:    Alexandra Dorsey is a 40 y.o. female Essential hypertension - Plan: amLODipine (NORVASC) 5 MG tablet, COMPLETE METABOLIC PANEL WITH GFR, CANCELED: Basic metabolic panel  - Persistent elevations outside of office and on recheck here still elevated. Intolerant to ace inhibitor and angiotensin receptor blocker previously.   -start amlodipine 5 mg daily. Also discussed cutting back on alcohol use as that also may impact her Dorsey pressure. Check CMP, return for recheck within the next 6-8 weeks. RTC precautions  Situational anxiety  - Situational anxiety/adjustment with her parent's illness.   - Episodic use of Xanax with 30 pills lasting for the past approximately 5 months.   - continue same dose for now (#30 Rx given), but advised if increased dosing needed, consider CBT/counseling or possible repeat trial of SSRI.   -Handout given on stress and  stress management.  Screening for hyperlipidemia - Plan: COMPLETE METABOLIC PANEL WITH GFR, Lipid panel   Meds ordered this encounter  Medications  . amLODipine (NORVASC) 5 MG tablet    Sig: Take 1 tablet (5 mg total) by mouth daily.    Dispense:  30 tablet    Refill:  1  . ALPRAZolam (XANAX) 0.5 MG tablet    Sig: Take 0.5-1 tablets (0.25-0.5 mg total) by mouth 2 (two) times daily as needed for anxiety.    Dispense:  30 tablet    Refill:  0   Patient Instructions    Start amlodipine, 1 pill once per day and pressure. Also cut back to no more than 1-2 drinks of alcohol per day as that is also likely contributing to some elevated readings. Let me know if you have any  difficulty cutting back on alcohol.  See information below on stress and stress management, I refilled Xanax as needed for breakthrough symptoms, but if you're requiring higher doses at night to help with sleep or more frequent doses of that medication, meeting with a therapist or considering another SSRI medication for anxiety may be beneficial. Follow-up with me to discuss those if that is the case.  Return to the clinic or go to the nearest emergency room if any of your symptoms worsen or new symptoms occur.   Hypertension Hypertension, commonly called high Dorsey pressure, is when the force of Dorsey pumping through your arteries is too strong. Your arteries are the Dorsey vessels that carry Dorsey from your heart throughout your body. A Dorsey pressure reading consists of a higher number over a lower number, such as 110/72. The higher number (systolic) is the pressure inside your arteries when your heart pumps. The lower number (diastolic) is the pressure inside your arteries when your heart relaxes. Ideally you want your Dorsey pressure below 120/80. Hypertension forces your heart to work harder to pump Dorsey. Your arteries may become narrow or stiff. Having untreated or uncontrolled hypertension can cause heart attack, stroke,  kidney disease, and other problems. RISK FACTORS Some risk factors for high Dorsey pressure are controllable. Others are not.  Risk factors you cannot control include:   Race. You may be at higher risk if you are African American.  Age. Risk increases with age.  Gender. Men are at higher risk than women before age 43 years. After age 69, women are at higher risk than men. Risk factors you can control include:  Not getting enough exercise or physical activity.  Being overweight.  Getting too much fat, sugar, calories, or salt in your diet.  Drinking too much alcohol. SIGNS AND SYMPTOMS Hypertension does not usually cause signs or symptoms. Extremely high Dorsey pressure (hypertensive crisis) may cause headache, anxiety, shortness of breath, and nosebleed. DIAGNOSIS To check if you have hypertension, your health care provider will measure your Dorsey pressure while you are seated, with your arm held at the level of your heart. It should be measured at least twice using the same arm. Certain conditions can cause a difference in Dorsey pressure between your right and left arms. A Dorsey pressure reading that is higher than normal on one occasion does not mean that you need treatment. If it is not clear whether you have high Dorsey pressure, you may be asked to return on a different day to have your Dorsey pressure checked again. Or, you may be asked to monitor your Dorsey pressure at home for 1 or more weeks. TREATMENT Treating high Dorsey pressure includes making lifestyle changes and possibly taking medicine. Living a healthy lifestyle can help lower high Dorsey pressure. You may need to change some of your habits. Lifestyle changes may include:  Following the DASH diet. This diet is high in fruits, vegetables, and whole grains. It is low in salt, red meat, and added sugars.  Keep your sodium intake below 2,300 mg per day.  Getting at least 30-45 minutes of aerobic exercise at least 4 times per  week.  Losing weight if necessary.  Not smoking.  Limiting alcoholic beverages.  Learning ways to reduce stress. Your health care provider may prescribe medicine if lifestyle changes are not enough to get your Dorsey pressure under control, and if one of the following is true:  You are 37-1 years of age and your systolic Dorsey pressure is  above 140.  You are 75 years of age or older, and your systolic Dorsey pressure is above 150.  Your diastolic Dorsey pressure is above 90.  You have diabetes, and your systolic Dorsey pressure is over 681 or your diastolic Dorsey pressure is over 90.  You have kidney disease and your Dorsey pressure is above 140/90.  You have heart disease and your Dorsey pressure is above 140/90. Your personal target Dorsey pressure may vary depending on your medical conditions, your age, and other factors. HOME CARE INSTRUCTIONS  Have your Dorsey pressure rechecked as directed by your health care provider.   Take medicines only as directed by your health care provider. Follow the directions carefully. Dorsey pressure medicines must be taken as prescribed. The medicine does not work as well when you skip doses. Skipping doses also puts you at risk for problems.  Do not smoke.   Monitor your Dorsey pressure at home as directed by your health care provider. SEEK MEDICAL CARE IF:   You think you are having a reaction to medicines taken.  You have recurrent headaches or feel dizzy.  You have swelling in your ankles.  You have trouble with your vision. SEEK IMMEDIATE MEDICAL CARE IF:  You develop a severe headache or confusion.  You have unusual weakness, numbness, or feel faint.  You have severe chest or abdominal pain.  You vomit repeatedly.  You have trouble breathing. MAKE SURE YOU:   Understand these instructions.  Will watch your condition.  Will get help right away if you are not doing well or get worse.   This information is not intended to  replace advice given to you by your health care provider. Make sure you discuss any questions you have with your health care provider.   Document Released: 10/03/2005 Document Revised: 02/17/2015 Document Reviewed: 07/26/2013 Elsevier Interactive Patient Education 2016 [place] and Stress Management Stress is a normal reaction to life events. It is what you feel when life demands more than you are used to or more than you can handle. Some stress can be useful. For example, the stress reaction can help you catch the last bus of the day, study for a test, or meet a deadline at work. But stress that occurs too often or for too long can cause problems. It can affect your emotional health and interfere with relationships and normal daily activities. Too much stress can weaken your immune system and increase your risk for physical illness. If you already have a medical problem, stress can make it worse. CAUSES  All sorts of life events may cause stress. An event that causes stress for one person may not be stressful for another person. Major life events commonly cause stress. These may be positive or negative. Examples include losing your job, moving into a new home, getting married, having a baby, or losing a loved one. Less obvious life events may also cause stress, especially if they occur day after day or in combination. Examples include working long hours, driving in traffic, caring for children, being in debt, or being in a difficult relationship. SIGNS AND SYMPTOMS Stress may cause emotional symptoms including, the following:  Anxiety. This is feeling worried, afraid, on edge, overwhelmed, or out of control.  Anger. This is feeling irritated or impatient.  Depression. This is feeling sad, down, helpless, or guilty.  Difficulty focusing, remembering, or making decisions. Stress may cause physical symptoms, including the following:   Aches and pains. These may affect your  head, neck,  back, stomach, or other areas of your body.  Tight muscles or clenched jaw.  Low energy or trouble sleeping. Stress may cause unhealthy behaviors, including the following:   Eating to feel better (overeating) or skipping meals.  Sleeping too little, too much, or both.  Working too much or putting off tasks (procrastination).  Smoking, drinking alcohol, or using drugs to feel better. DIAGNOSIS  Stress is diagnosed through an assessment by your health care provider. Your health care provider will ask questions about your symptoms and any stressful life events.Your health care provider will also ask about your medical history and may order Dorsey tests or other tests. Certain medical conditions and medicine can cause physical symptoms similar to stress. Mental illness can cause emotional symptoms and unhealthy behaviors similar to stress. Your health care provider may refer you to a mental health professional for further evaluation.  TREATMENT  Stress management is the recommended treatment for stress.The goals of stress management are reducing stressful life events and coping with stress in healthy ways.  Techniques for reducing stressful life events include the following:  Stress identification. Self-monitor for stress and identify what causes stress for you. These skills may help you to avoid some stressful events.  Time management. Set your priorities, keep a calendar of events, and learn to say "no." These tools can help you avoid making too many commitments. Techniques for coping with stress include the following:  Rethinking the problem. Try to think realistically about stressful events rather than ignoring them or overreacting. Try to find the positives in a stressful situation rather than focusing on the negatives.  Exercise. Physical exercise can release both physical and emotional tension. The key is to find a form of exercise you enjoy and do it regularly.  Relaxation  techniques. These relax the body and mind. Examples include yoga, meditation, tai chi, biofeedback, deep breathing, progressive muscle relaxation, listening to music, being out in nature, journaling, and other hobbies. Again, the key is to find one or more that you enjoy and can do regularly.  Healthy lifestyle. Eat a balanced diet, get plenty of sleep, and do not smoke. Avoid using alcohol or drugs to relax.  Strong support network. Spend time with family, friends, or other people you enjoy being around.Express your feelings and talk things over with someone you trust. Counseling or talktherapy with a mental health professional may be helpful if you are having difficulty managing stress on your own. Medicine is typically not recommended for the treatment of stress.Talk to your health care provider if you think you need medicine for symptoms of stress. HOME CARE INSTRUCTIONS  Keep all follow-up visits as directed by your health care provider.  Take all medicines as directed by your health care provider. SEEK MEDICAL CARE IF:  Your symptoms get worse or you start having new symptoms.  You feel overwhelmed by your problems and can no longer manage them on your own. SEEK IMMEDIATE MEDICAL CARE IF:  You feel like hurting yourself or someone else.   This information is not intended to replace advice given to you by your health care provider. Make sure you discuss any questions you have with your health care provider.   Document Released: 03/29/2001 Document Revised: 10/24/2014 Document Reviewed: 05/28/2013 Elsevier Interactive Patient Education [employer].   IF you received an x-Dorsey today, you will receive an invoice from [facility]. Please contact [facility] at [phone] with questions or concerns regarding your invoice.   IF  you received labwork today, you will receive an invoice from [employer] [facility]. Please contact [facility] at  [phone] with questions or concerns regarding your invoice.   Our billing staff will not be able to assist you with questions regarding bills from these companies.  You will be contacted with the lab results as soon as they are available. The fastest way to get your results is to activate your My Chart account. Instructions are located on the last page of this paperwork. If you have not heard from [place] regarding the results in 2 weeks, please contact this office.       I personally performed the services described in this documentation, which was scribed in my presence. The recorded information has been reviewed and considered, and addended by me as needed.   Signed,   Alexandra Ray, MD [facility].  08/06/16 12:25 PM

## 2016-08-08 LAB — COMPLETE METABOLIC PANEL WITH GFR
ALBUMIN: 4.2 g/dL (ref 3.6–5.1)
ALK PHOS: 50 U/L (ref 33–115)
ALT: 13 U/L (ref 6–29)
AST: 31 U/L — ABNORMAL HIGH (ref 10–30)
BUN: 11 mg/dL (ref 7–25)
CALCIUM: 9.7 mg/dL (ref 8.6–10.2)
CHLORIDE: 100 mmol/L (ref 98–110)
CO2: 26 mmol/L (ref 20–31)
Creat: 0.65 mg/dL (ref 0.50–1.10)
Glucose, Bld: 105 mg/dL — ABNORMAL HIGH (ref 65–99)
POTASSIUM: 3.9 mmol/L (ref 3.5–5.3)
Sodium: 140 mmol/L (ref 135–146)
Total Bilirubin: 0.3 mg/dL (ref 0.2–1.2)
Total Protein: 6.9 g/dL (ref 6.1–8.1)

## 2016-08-08 LAB — LIPID PANEL
CHOLESTEROL: 226 mg/dL — AB (ref 125–200)
HDL: 78 mg/dL (ref >=46)
LDL Cholesterol: 97 mg/dL (ref <130)
TRIGLYCERIDES: 257 mg/dL — AB (ref <150)
Total CHOL/HDL Ratio: 2.9 Ratio (ref <=5.0)
VLDL: 51 mg/dL — AB (ref <30)

## 2016-09-10 ENCOUNTER — Other Ambulatory Visit: Payer: Self-pay | Admitting: Family Medicine

## 2016-09-10 NOTE — Telephone Encounter (Signed)
Refilled, but if persistent frequent need, follow-up to discuss other daily medication as mentioned last visit. Let me know if there are questions.

## 2016-09-10 NOTE — Telephone Encounter (Signed)
08/06/16 last ov and refill.

## 2016-09-14 NOTE — Telephone Encounter (Signed)
LMOM for pt w/Dr Greene's message. 

## 2016-10-14 ENCOUNTER — Other Ambulatory Visit: Payer: Self-pay | Admitting: Family Medicine

## 2016-10-14 DIAGNOSIS — I10 Essential (primary) hypertension: Secondary | ICD-10-CM

## 2016-10-18 ENCOUNTER — Ambulatory Visit: Payer: BLUE CROSS/BLUE SHIELD

## 2016-10-25 ENCOUNTER — Ambulatory Visit: Payer: BLUE CROSS/BLUE SHIELD

## 2016-11-11 ENCOUNTER — Telehealth: Payer: Self-pay | Admitting: Family Medicine

## 2016-11-11 ENCOUNTER — Ambulatory Visit (INDEPENDENT_AMBULATORY_CARE_PROVIDER_SITE_OTHER): Payer: BLUE CROSS/BLUE SHIELD | Admitting: Family Medicine

## 2016-11-11 VITALS — BP 158/108 | HR 123 | Temp 98.6°F | Resp 18 | Ht 61.75 in | Wt 121.0 lb

## 2016-11-11 DIAGNOSIS — F410 Panic disorder [episodic paroxysmal anxiety] without agoraphobia: Secondary | ICD-10-CM | POA: Diagnosis not present

## 2016-11-11 DIAGNOSIS — J069 Acute upper respiratory infection, unspecified: Secondary | ICD-10-CM

## 2016-11-11 DIAGNOSIS — I10 Essential (primary) hypertension: Secondary | ICD-10-CM

## 2016-11-11 DIAGNOSIS — F418 Other specified anxiety disorders: Secondary | ICD-10-CM | POA: Diagnosis not present

## 2016-11-11 DIAGNOSIS — R000 Tachycardia, unspecified: Secondary | ICD-10-CM

## 2016-11-11 DIAGNOSIS — A084 Viral intestinal infection, unspecified: Secondary | ICD-10-CM

## 2016-11-11 MED ORDER — AMLODIPINE BESYLATE 10 MG PO TABS: 10.0000 mg | ORAL_TABLET | Freq: Every day | ORAL | 6 refills | Status: DC

## 2016-11-11 MED ORDER — ALPRAZOLAM 0.5 MG PO TABS: ORAL_TABLET | ORAL | 0 refills | Status: DC

## 2016-11-11 MED ORDER — ONDANSETRON HCL 4 MG PO TABS: 4.0000 mg | ORAL_TABLET | Freq: Three times a day (TID) | ORAL | 0 refills | Status: DC | PRN

## 2016-11-11 NOTE — Telephone Encounter (Signed)
Spoke with patient. Pharmacy did receive Rx and she has picked it up.

## 2016-11-11 NOTE — Progress Notes (Signed)
Chief Complaint  Patient presents with  . Nausea    x 4 days   . Emesis  . Diarrhea  . Cough  . Sore Throat  . Medication Refill    HPI  Nausea and Vomiting Pt reports nausea for 4 days with vomiting and anorexia She also has diarrhea every time she drinks which is about 6-7 times a day She stopped eating so she has not been throwing up She reports that she has not eaten since 2 days ago She is drinking gatorade and water today.  She reports tactile fevers and chills. She reports that she also has a cough and sore throat.   Hypertension She reports that she is taking amlodipine 5mg .  She reports that she checks her bp monitor at home and her bp stays around 140/95. She reports that she tried lisinopril but it caused dry cough. She states that this medication does not have any side effects for her to reports but is not treating her blood pressures enough in her opinion. BP Readings from Last 3 Encounters:  11/11/16 (!) 158/108  08/06/16 (!) 130/98  11/29/15 (!) 153/107   Wt Readings from Last 3 Encounters:  11/11/16 121 lb (54.9 kg)  08/06/16 126 lb 3.2 oz (57.2 kg)  11/29/15 128 lb (58.1 kg)   Anxiety and Panic Attacks She reports that she takes xanax for panic attacks and anxiety She reports that she does not take the xanax daily Recently due to her acute illness she has been so anxious she can be seen visibly shaking. She reports that she needs a medication refill.   Past Medical History:  Diagnosis Date  . Anxiety   . Hypertension     Current Outpatient Prescriptions  Medication Sig Dispense Refill  . ALPRAZolam (XANAX) 0.5 MG tablet take 1/2 to 1 tablets by mouth twice a day if needed for anxiety 30 tablet 0  . amLODipine (NORVASC) 10 MG tablet Take 1 tablet (10 mg total) by mouth daily. 30 tablet 6  . ondansetron (ZOFRAN) 4 MG tablet Take 1 tablet (4 mg total) by mouth every 8 (eight) hours as needed for nausea or vomiting. 20 tablet 0   No current  facility-administered medications for this visit.     Allergies:  Allergies  Allergen Reactions  . Lisinopril Cough    History reviewed. No pertinent surgical history.  Social History   Social History  . Marital status: Married    Spouse name: N/A  . Number of children: N/A  . Years of education: N/A   Social History Main Topics  . Smoking status: Former Games developer  . Smokeless tobacco: Never Used  . Alcohol use 6.0 oz/week    10 Cans of beer per week     Comment: daily  . Drug use: No  . Sexual activity: Not Currently   Other Topics Concern  . None   Social History Narrative  . None    Review of Systems  Constitutional:       See hpi  HENT: Positive for congestion. Negative for sinus pain and sore throat.   Eyes: Negative for blurred vision, double vision and photophobia.  Respiratory:       See hpi  Cardiovascular: Negative for chest pain, palpitations and leg swelling.  Gastrointestinal: Positive for diarrhea, nausea and vomiting. Negative for abdominal pain and constipation.  Genitourinary: Negative for dysuria, frequency and urgency.  Skin: Negative for itching and rash.  Neurological: Positive for tremors. Negative for dizziness and headaches.  Psychiatric/Behavioral: Negative for depression and hallucinations. The patient is nervous/anxious.     Objective: Vitals:   11/11/16 1229  BP: (!) 158/108  Pulse: (!) 123  Resp: 18  Temp: 98.6 F (37 C)  TempSrc: Oral  SpO2: 98%  Weight: 121 lb (54.9 kg)  Height: 5' 1.75" (1.568 m)    Physical Exam General: alert, oriented, in NAD Head: normocephalic, atraumatic, no sinus tenderness Eyes: EOM intact, no scleral icterus or conjunctival injection Ears: TM clear bilaterally Throat: no pharyngeal exudate or erythema Lymph: no posterior auricular, submental or cervical lymph adenopathy Heart: normal rate, normal sinus rhythm, no murmurs Lungs: clear to auscultation bilaterally, no wheezing Abdomen:  nondistended, normoactive bs, soft, nontender Extremities: no edema, cap refill <2s Psych: anxious affect, tremulous during conversation  Assessment and Plan Alexandra Dorsey was seen today for nausea, emesis, diarrhea, cough, sore throat and medication refill.  Diagnoses and all orders for this visit:  Situational anxiety- refilled xanax and reassured pt with explanation of the clinical course of her illness and expected recovery time  Essential hypertension- blood pressure elevated  Increased amlodipine dose Pt to return with her PCP for bp recheck Will check BMP today -     amLODipine (NORVASC) 10 MG tablet; Take 1 tablet (10 mg total) by mouth daily. -     Basic metabolic panel  Acute URI- supportive care and fluid hydration  Tachycardia with heart rate 100-120 beats per minute- advised pt to increase her hydration To avoid decongestants with dextromorphan  Viral gastroenteritis- supportive care, fluid hydration  Other orders -     ALPRAZolam (XANAX) 0.5 MG tablet; take 1/2 to 1 tablets by mouth twice a day if needed for anxiety -     ondansetron (ZOFRAN) 4 MG tablet; Take 1 tablet (4 mg total) by mouth every 8 (eight) hours as needed for nausea or vomiting.     Alexandra Dorsey A Henretter Piekarski

## 2016-11-11 NOTE — Patient Instructions (Addendum)
   IF you received an x-ray today, you will receive an invoice from [facility]. Please contact [facility] at [phone] with questions or concerns regarding your invoice.   IF you received labwork today, you will receive an invoice from [facility]. Please contact [facility] at [phone] with questions or concerns regarding your invoice.   Our billing staff will not be able to assist you with questions regarding bills from these companies.  You will be contacted with the lab results as soon as they are available. The fastest way to get your results is to activate your My Chart account. Instructions are located on the last page of this paperwork. If you have not heard from us regarding the results in 2 weeks, please contact this office.     Viral Gastroenteritis, Adult Viral gastroenteritis is also known as the stomach flu. This condition is caused by various viruses. These viruses can be passed from person to person very easily (are very contagious). This condition may affect your stomach, small intestine, and large intestine. It can cause sudden watery diarrhea, fever, and vomiting. Diarrhea and vomiting can make you feel weak and cause you to become dehydrated. You may not be able to keep fluids down. Dehydration can make you tired and thirsty, cause you to have a dry mouth, and decrease how often you urinate. Older adults and people with other diseases or a weak immune system are at higher risk for dehydration. It is important to replace the fluids that you lose from diarrhea and vomiting. If you become severely dehydrated, you may need to get fluids through an IV tube. What are the causes? Gastroenteritis is caused by various viruses, including rotavirus and norovirus. Norovirus is the most common cause in adults. You can get sick by eating food, drinking water, or touching a surface contaminated with one of these viruses. You can also get sick from sharing utensils or  other personal items with an infected person. What increases the risk? This condition is more likely to develop in people:  Who have a weak defense system (immune system).  Who live with one or more children who are younger than 2 years old.  Who live in a nursing home.  Who go on cruise ships. What are the signs or symptoms? Symptoms of this condition start suddenly 1-2 days after exposure to a virus. Symptoms may last a few days or as long as a week. The most common symptoms are watery diarrhea and vomiting. Other symptoms include:  Fever.  Headache.  Fatigue.  Pain in the abdomen.  Chills.  Weakness.  Nausea.  Muscle aches.  Loss of appetite. How is this diagnosed? This condition is diagnosed with a medical history and physical exam. You may also have a stool test to check for viruses or other infections. How is this treated? This condition typically goes away on its own. The focus of treatment is to restore lost fluids (rehydration). Your health care provider may recommend that you take an oral rehydration solution (ORS) to replace important salts and minerals (electrolytes) in your body. Severe cases of this condition may require giving fluids through an IV tube. Treatment may also include medicine to help with your symptoms. Follow these instructions at home: Follow instructions from your health care provider about how to care for yourself at home. Eating and drinking Follow these recommendations as told by your health care provider:  Take an ORS. This is a drink that is sold at pharmacies and retail stores.    Drink clear fluids in small amounts as you are able. Clear fluids include water, ice chips, diluted fruit juice, and low-calorie sports drinks.  Eat bland, easy-to-digest foods in small amounts as you are able. These foods include bananas, applesauce, rice, lean meats, toast, and crackers.  Avoid fluids that contain a lot of sugar or caffeine, such as energy  drinks, sports drinks, and soda.  Avoid alcohol.  Avoid spicy or fatty foods. General instructions  Drink enough fluid to keep your urine clear or pale yellow.  Wash your hands often. If soap and water are not available, use hand sanitizer.  Make sure that all people in your household wash their hands well and often.  Take over-the-counter and prescription medicines only as told by your health care provider.  Rest at home while you recover.  Watch your condition for any changes.  Take a warm bath to relieve any burning or pain from frequent diarrhea episodes.  Keep all follow-up visits as told by your health care provider. This is important. Contact a health care provider if:  You cannot keep fluids down.  Your symptoms get worse.  You have new symptoms.  You feel light-headed or dizzy.  You have muscle cramps. Get help right away if:  You have chest pain.  You feel extremely weak or you faint.  You see blood in your vomit.  Your vomit looks like coffee grounds.  You have bloody or black stools or stools that look like tar.  You have a severe headache, a stiff neck, or both.  You have a rash.  You have severe pain, cramping, or bloating in your abdomen.  You have trouble breathing or you are breathing very quickly.  Your heart is beating very quickly.  Your skin feels cold and clammy.  You feel confused.  You have pain when you urinate.  You have signs of dehydration, such as:  Dark urine, very little urine, or no urine.  Cracked lips.  Dry mouth.  Sunken eyes.  Sleepiness.  Weakness. This information is not intended to replace advice given to you by your health care provider. Make sure you discuss any questions you have with your health care provider. Document Released: 10/03/2005 Document Revised: 03/16/2016 Document Reviewed: 06/09/2015 Elsevier Interactive Patient Education  2017 Elsevier Inc.  

## 2016-11-11 NOTE — Telephone Encounter (Signed)
Pt calling about Zofran that wasn't called into pharmacy plese call pt she really need this she states that the provider was going to give her a RX for it

## 2016-11-12 LAB — BASIC METABOLIC PANEL
BUN / CREAT RATIO: 11 (ref 9–23)
BUN: 7 mg/dL (ref 6–20)
CALCIUM: 9.3 mg/dL (ref 8.7–10.2)
CO2: 24 mmol/L (ref 18–29)
Chloride: 94 mmol/L — ABNORMAL LOW (ref 96–106)
Creatinine, Ser: 0.62 mg/dL (ref 0.57–1.00)
GFR calc non Af Amer: 114 mL/min/{1.73_m2} (ref >59)
GFR, EST AFRICAN AMERICAN: 131 mL/min/{1.73_m2} (ref >59)
Glucose: 109 mg/dL — ABNORMAL HIGH (ref 65–99)
Potassium: 3.5 mmol/L (ref 3.5–5.2)
Sodium: 141 mmol/L (ref 134–144)

## 2016-11-13 DIAGNOSIS — I10 Essential (primary) hypertension: Secondary | ICD-10-CM | POA: Insufficient documentation

## 2016-11-13 DIAGNOSIS — F410 Panic disorder [episodic paroxysmal anxiety] without agoraphobia: Secondary | ICD-10-CM | POA: Insufficient documentation

## 2016-11-17 ENCOUNTER — Telehealth: Payer: Self-pay

## 2016-11-17 NOTE — Telephone Encounter (Signed)
PATIENT STATES SHE SAW DR. Creta LevinSTALLINGS AND SHE HAD THE FLU. SHE SAID SHE WAS ONLY GIVEN SOMETHING FOR NAUSEA. SHE IS WEAK, VOMITING AND HAVING DIARRHEA. SHE WANTS TO KNOW HOW LONG THIS SHOULD LAST BECAUSE SHE FEELS TERRIBLE? SHE WOULD LIKE TO GET SOMETHING CALLED INTO HER PHARMACY. BEST PHONE [phone] (CELL) PHARMACY CHOICE IS [facility] ON [address] [phone]   MBC

## 2016-11-17 NOTE — Telephone Encounter (Signed)
No vm set up. 

## 2016-11-18 ENCOUNTER — Encounter: Payer: Self-pay | Admitting: Family Medicine

## 2016-11-18 NOTE — Telephone Encounter (Signed)
fyi

## 2016-11-18 NOTE — Telephone Encounter (Signed)
PT states she has transportation for pick up 11/19/16 for both rx and note [phone]  Please contact when ready

## 2016-11-18 NOTE — Telephone Encounter (Signed)
Pt wants something for diarrhea and wants a note for off work this whole week 1/29-2/2 Was seen 11/11/16 I advised immodium and re check for note and she wants me to ask dr Creta Levinstallings

## 2016-11-18 NOTE — Telephone Encounter (Signed)
Pt advised.

## 2016-12-20 ENCOUNTER — Telehealth: Payer: Self-pay | Admitting: Family Medicine

## 2016-12-20 ENCOUNTER — Other Ambulatory Visit: Payer: Self-pay | Admitting: Family Medicine

## 2016-12-20 NOTE — Telephone Encounter (Signed)
Pt states that she is calling back she states that she left a message last Monday and know one responded her feet has been swelling so she decided to her BP medicine from 10-5 mg since knowone responded to her she couldn't go to work like that the she said that the swelling didn't go down but now she is having HA that has been going on for the last two days and her feet still hurt but swelling is gone she would like for her BP medicine to be changed please respond

## 2016-12-20 NOTE — Telephone Encounter (Signed)
Marylene LandAngela,  Dr. Creta LevinStallings will be in the office tomorrow please see if you can work patient in at the appointment center or schedule her to be seen here. She has had uncontrolled hypertension and therefore she was placed on high dose of Amlodipine which can cause swelling. She needs to be seen.

## 2016-12-26 NOTE — Telephone Encounter (Signed)
Alprazolam called to [facility]/([facility])

## 2017-03-21 ENCOUNTER — Encounter: Payer: Self-pay | Admitting: Family Medicine

## 2017-03-21 ENCOUNTER — Ambulatory Visit (INDEPENDENT_AMBULATORY_CARE_PROVIDER_SITE_OTHER): Payer: BLUE CROSS/BLUE SHIELD | Admitting: Family Medicine

## 2017-03-21 VITALS — BP 126/87 | HR 90 | Temp 98.0°F | Resp 17 | Ht 61.5 in | Wt 120.0 lb

## 2017-03-21 DIAGNOSIS — I10 Essential (primary) hypertension: Secondary | ICD-10-CM | POA: Diagnosis not present

## 2017-03-21 DIAGNOSIS — J301 Allergic rhinitis due to pollen: Secondary | ICD-10-CM

## 2017-03-21 DIAGNOSIS — F411 Generalized anxiety disorder: Secondary | ICD-10-CM

## 2017-03-21 DIAGNOSIS — R600 Localized edema: Secondary | ICD-10-CM | POA: Diagnosis not present

## 2017-03-21 DIAGNOSIS — G479 Sleep disorder, unspecified: Secondary | ICD-10-CM | POA: Diagnosis not present

## 2017-03-21 MED ORDER — TRAZODONE HCL 50 MG PO TABS: 25.0000 mg | ORAL_TABLET | Freq: Every evening | ORAL | 3 refills | Status: DC | PRN

## 2017-03-21 MED ORDER — FLUTICASONE PROPIONATE 50 MCG/ACT NA SUSP: 1.0000 | Freq: Every day | NASAL | 1 refills | Status: DC

## 2017-03-21 MED ORDER — ATENOLOL 25 MG PO TABS: 12.5000 mg | ORAL_TABLET | Freq: Every day | ORAL | 0 refills | Status: DC

## 2017-03-21 MED ORDER — RANITIDINE HCL 150 MG PO TABS: 150.0000 mg | ORAL_TABLET | Freq: Two times a day (BID) | ORAL | 2 refills | Status: DC

## 2017-03-21 NOTE — Progress Notes (Signed)
Chief Complaint  Patient presents with  . Medication Refill    amlodipine     HPI   Hypertension: Patient here for follow-up of elevated blood pressure. She is exercising and is adherent to low salt diet.  Blood pressure is not well controlled at home.  BP Readings from Last 3 Encounters:  03/21/17 126/87  11/11/16 (!) 158/108  08/06/16 (!) 130/98   This morning her bp is 130/88 with her home bp cuff. She states that she has been having bp 150/80s.  lisinopril caused a dry cough. With losartan she could not sleep. She states that with amlodipine she has some lower extremity edema but it is mild.  She has anxiety as well.   Wt Readings from Last 3 Encounters:  03/21/17 120 lb (54.4 kg)  11/11/16 121 lb (54.9 kg)  08/06/16 126 lb 3.2 oz (57.2 kg)    Anxiety and sleep Pt was taking xanax for situational anxiety in the days and for sleep at night She reports that she was taking 1-2 tablets by mouth at bedtime She reports that she has not tried anything else for sleep She has been on xanax for about a year   GERD Pt was on a cruise and had reflux symptoms She took nexium, pepcid    Past Medical History:  Diagnosis Date  . Anxiety   . Hypertension     Current Outpatient Prescriptions  Medication Sig Dispense Refill  . ALPRAZolam (XANAX) 0.5 MG tablet take 1/2 to 1 tablet by mouth twice a day if needed for anxiety 30 tablet 0  . amLODipine (NORVASC) 10 MG tablet Take 1 tablet (10 mg total) by mouth daily. 30 tablet 6  . ondansetron (ZOFRAN) 4 MG tablet Take 1 tablet (4 mg total) by mouth every 8 (eight) hours as needed for nausea or vomiting. 20 tablet 0  . atenolol (TENORMIN) 25 MG tablet Take 0.5 tablets (12.5 mg total) by mouth daily. For one week. Increase to one tablet daily by week 2. 90 tablet 0  . fluticasone (FLONASE) 50 MCG/ACT nasal spray Place 1 spray into both nostrils daily. 16 g 1  . ranitidine (ZANTAC) 150 MG tablet Take 1 tablet (150 mg total) by mouth 2  (two) times daily. 60 tablet 2  . traZODone (DESYREL) 50 MG tablet Take 0.5-1 tablets (25-50 mg total) by mouth at bedtime as needed for sleep. 30 tablet 3   No current facility-administered medications for this visit.     Allergies:  Allergies  Allergen Reactions  . Lisinopril Cough    No past surgical history on file.  Social History   Social History  . Marital status: Married    Spouse name: N/A  . Number of children: N/A  . Years of education: N/A   Social History Main Topics  . Smoking status: Former Games developermoker  . Smokeless tobacco: Never Used  . Alcohol use 6.0 oz/week    10 Cans of beer per week     Comment: daily  . Drug use: No  . Sexual activity: Not Currently   Other Topics Concern  . None   Social History Narrative  . None    ROS  Objective: Vitals:   03/21/17 1203  BP: 126/87  Pulse: 90  Resp: 17  Temp: 98 F (36.7 C)  TempSrc: Oral  SpO2: 98%  Weight: 120 lb (54.4 kg)  Height: 5' 1.5" (1.562 m)    Physical Exam General: alert, oriented, in NAD Head: normocephalic, atraumatic, no sinus  tenderness Eyes: EOM intact, no scleral icterus or conjunctival injection Ears: TM clear bilaterally Nose: mucosa nonerythematous, nonedematous Throat: no pharyngeal exudate or erythema Lymph: no posterior auricular, submental or cervical lymph adenopathy Heart: normal rate, normal sinus rhythm, no murmurs Lungs: clear to auscultation bilaterally, no wheezing Extremities: no edema, cap refill <2s  Assessment and Plan Alexandra Dorsey was seen today for medication refill.  Diagnoses and all orders for this visit:  Essential hypertension- continue amlodipine and atenolol  Anxiety state- will try to improve sleep and wean off xanax Will add atenolol for bp and anxiety  Lower extremity edema- DASH diet advised Will monitor since Amlodipine can cause LE edema  Disordered sleep- will try trazodone for sleep -     traZODone (DESYREL) 50 MG tablet; Take 0.5-1  tablets (25-50 mg total) by mouth at bedtime as needed for sleep.  Seasonal allergic rhinitis due to pollen- continue flonase  Other orders -     atenolol (TENORMIN) 25 MG tablet; Take 0.5 tablets (12.5 mg total) by mouth daily. For one week. Increase to one tablet daily by week 2. -     fluticasone (FLONASE) 50 MCG/ACT nasal spray; Place 1 spray into both nostrils daily. -     ranitidine (ZANTAC) 150 MG tablet; Take 1 tablet (150 mg total) by mouth 2 (two) times daily.     Alexandra Dorsey A Alexandra Dorsey

## 2017-03-21 NOTE — Patient Instructions (Addendum)
   IF you received an x-ray today, you will receive an invoice from [facility]. Please contact [facility] at [phone] with questions or concerns regarding your invoice.   IF you received labwork today, you will receive an invoice from [facility]. Please contact [facility] at [phone] with questions or concerns regarding your invoice.   Our billing staff will not be able to assist you with questions regarding bills from these companies.  You will be contacted with the lab results as soon as they are available. The fastest way to get your results is to activate your My Chart account. Instructions are located on the last page of this paperwork. If you have not heard from us regarding the results in 2 weeks, please contact this office.      Food Choices for Gastroesophageal Reflux Disease, Adult When you have gastroesophageal reflux disease (GERD), the foods you eat and your eating habits are very important. Choosing the right foods can help ease the discomfort of GERD. Consider working with a diet and nutrition specialist (dietitian) to help you make healthy food choices. What general guidelines should I follow? Eating plan  Choose healthy foods low in fat, such as fruits, vegetables, whole grains, low-fat dairy products, and lean meat, fish, and poultry.  Eat frequent, small meals instead of three large meals each day. Eat your meals slowly, in a relaxed setting. Avoid bending over or lying down until 2-3 hours after eating.  Limit high-fat foods such as fatty meats or fried foods.  Limit your intake of oils, butter, and shortening to less than 8 teaspoons each day.  Avoid the following: ? Foods that cause symptoms. These may be different for different people. Keep a food diary to keep track of foods that cause symptoms. ? Alcohol. ? Drinking large amounts of liquid with meals. ? Eating meals during the 2-3 hours before bed.  Cook foods using methods other  than frying. This may include baking, grilling, or broiling. Lifestyle   Maintain a healthy weight. Ask your health care provider what weight is healthy for you. If you need to lose weight, work with your health care provider to do so safely.  Exercise for at least 30 minutes on 5 or more days each week, or as told by your health care provider.  Avoid wearing clothes that fit tightly around your waist and chest.  Do not use any products that contain nicotine or tobacco, such as cigarettes and e-cigarettes. If you need help quitting, ask your health care provider.  Sleep with the head of your bed raised. Use a wedge under the mattress or blocks under the bed frame to raise the head of the bed. What foods are not recommended? The items listed may not be a complete list. Talk with your dietitian about what dietary choices are best for you. Grains Pastries or quick breads with added fat. French toast. Vegetables Deep fried vegetables. French fries. Any vegetables prepared with added fat. Any vegetables that cause symptoms. For some people this may include tomatoes and tomato products, chili peppers, onions and garlic, and horseradish. Fruits Any fruits prepared with added fat. Any fruits that cause symptoms. For some people this may include citrus fruits, such as oranges, grapefruit, pineapple, and lemons. Meats and other protein foods High-fat meats, such as fatty beef or pork, hot dogs, ribs, ham, sausage, salami and bacon. Fried meat or protein, including fried fish and fried chicken. Nuts and nut butters. Dairy Whole milk and chocolate milk. Sour   cream. Cream. Ice cream. Cream cheese. Milk shakes. Beverages Coffee and tea, with or without caffeine. Carbonated beverages. Sodas. Energy drinks. Fruit juice made with acidic fruits (such as orange or grapefruit). Tomato juice. Alcoholic drinks. Fats and oils Butter. Margarine. Shortening. Ghee. Sweets and desserts Chocolate and cocoa.  Donuts. Seasoning and other foods Pepper. Peppermint and spearmint. Any condiments, herbs, or seasonings that cause symptoms. For some people, this may include curry, hot sauce, or vinegar-based salad dressings. Summary  When you have gastroesophageal reflux disease (GERD), food and lifestyle choices are very important to help ease the discomfort of GERD.  Eat frequent, small meals instead of three large meals each day. Eat your meals slowly, in a relaxed setting. Avoid bending over or lying down until 2-3 hours after eating.  Limit high-fat foods such as fatty meat or fried foods. This information is not intended to replace advice given to you by your health care provider. Make sure you discuss any questions you have with your health care provider. Document Released: 10/03/2005 Document Revised: 10/04/2016 Document Reviewed: 10/04/2016 Elsevier Interactive Patient Education  2017 Elsevier Inc.  

## 2017-03-30 ENCOUNTER — Telehealth: Payer: Self-pay | Admitting: Family Medicine

## 2017-03-30 NOTE — Telephone Encounter (Signed)
Pt calling concerning blood pressure medicine. Pt said bp is 140/98. Pt wanted to know if she needs to change blood pressure medications or increase amount of current medication because it is not going down. Pt also needs refill for xanax but said she would call her pharmacy first. Please advise. Pt callback number is [phone].

## 2017-03-30 NOTE — Telephone Encounter (Signed)
Called pt back and left message that if she feels it is an emergency to go to the emergency room as well.

## 2017-04-03 ENCOUNTER — Other Ambulatory Visit: Payer: Self-pay | Admitting: Family Medicine

## 2017-04-03 NOTE — Telephone Encounter (Signed)
Please advise 

## 2017-04-05 NOTE — Telephone Encounter (Signed)
Rx called into [facility] on file

## 2017-05-16 ENCOUNTER — Other Ambulatory Visit: Payer: Self-pay | Admitting: Family Medicine

## 2017-05-29 ENCOUNTER — Encounter: Payer: Self-pay | Admitting: Emergency Medicine

## 2017-05-29 ENCOUNTER — Ambulatory Visit (INDEPENDENT_AMBULATORY_CARE_PROVIDER_SITE_OTHER): Payer: BLUE CROSS/BLUE SHIELD | Admitting: Emergency Medicine

## 2017-05-29 VITALS — BP 112/60 | HR 109 | Temp 98.2°F | Resp 16 | Ht 60.5 in | Wt 115.2 lb

## 2017-05-29 DIAGNOSIS — R6883 Chills (without fever): Secondary | ICD-10-CM | POA: Diagnosis not present

## 2017-05-29 DIAGNOSIS — J029 Acute pharyngitis, unspecified: Secondary | ICD-10-CM | POA: Insufficient documentation

## 2017-05-29 DIAGNOSIS — R112 Nausea with vomiting, unspecified: Secondary | ICD-10-CM | POA: Diagnosis not present

## 2017-05-29 MED ORDER — ONDANSETRON HCL 4 MG PO TABS: 4.0000 mg | ORAL_TABLET | Freq: Three times a day (TID) | ORAL | 0 refills | Status: DC | PRN

## 2017-05-29 MED ORDER — ALPRAZOLAM 0.5 MG PO TABS: 0.5000 mg | ORAL_TABLET | Freq: Every evening | ORAL | 0 refills | Status: DC | PRN

## 2017-05-29 MED ORDER — AMOXICILLIN-POT CLAVULANATE 875-125 MG PO TABS: 1.0000 | ORAL_TABLET | Freq: Two times a day (BID) | ORAL | 0 refills | Status: AC

## 2017-05-29 NOTE — Progress Notes (Signed)
Alexandra CalkinKristen C Dorsey 41 y.o.   Chief Complaint  Patient presents with  . Chills    all symptoms started 05/28/17   . Emesis  . Medication Refill    xanax and zofran    HISTORY OF PRESENT ILLNESS: This is a 41 y.o. female complaining of sore throat tat started 4 days ago followed yesterday by chills, nausea, and vomiting. Throat still hurting.  HPI   Prior to Admission medications   Medication Sig Start Date End Date Taking? Authorizing Provider  ALPRAZolam Prudy Feeler(XANAX) 0.5 MG tablet 1/2 tablet by mouth as needed twice a day 04/04/17  Yes Dorsey, Alexandra A, MD  atenolol (TENORMIN) 25 MG tablet Take 0.5 tablets (12.5 mg total) by mouth daily. For one week. Increase to one tablet daily by week 2. 03/21/17  Yes Dorsey, Alexandra A, MD  fluticasone (FLONASE) 50 MCG/ACT nasal spray Place 1 spray into both nostrils daily. 03/21/17  Yes Alexandra Dorsey, Alexandra A, MD  ranitidine (ZANTAC) 150 MG tablet Take 1 tablet (150 mg total) by mouth 2 (two) times daily. 03/21/17  Yes Alexandra Dorsey, Alexandra A, MD  traZODone (DESYREL) 50 MG tablet Take 0.5-1 tablets (25-50 mg total) by mouth at bedtime as needed for sleep. 03/21/17  Yes Dorsey, Alexandra A, MD  amLODipine (NORVASC) 10 MG tablet Take 1 tablet (10 mg total) by mouth daily. Patient not taking: Reported on 05/29/2017 11/11/16   Alexandra Dorsey, Alexandra A, MD  ondansetron (ZOFRAN) 4 MG tablet Take 1 tablet (4 mg total) by mouth every 8 (eight) hours as needed for nausea or vomiting. Patient not taking: Reported on 05/29/2017 11/11/16   Alexandra Dorsey, Alexandra A, MD    Allergies  Allergen Reactions  . Lisinopril Cough  . Norvasc [Amlodipine Besylate] Other (See Comments)    Feet hurts    Patient Active Problem List   Diagnosis Date Noted  . Essential hypertension 11/13/2016  . Panic attacks 11/13/2016    Past Medical History:  Diagnosis Date  . Anxiety   . Hypertension     No past surgical history on file.  Social History   Social History  . Marital status: Married    Spouse name: N/A   . Number of children: N/A  . Years of education: N/A   Occupational History  . Not on file.   Social History Main Topics  . Smoking status: Former Games developermoker  . Smokeless tobacco: Never Used  . Alcohol use 6.0 oz/week    10 Cans of beer per week     Comment: daily  . Drug use: No  . Sexual activity: Not Currently   Other Topics Concern  . Not on file   Social History Narrative  . No narrative on file    Family History  Problem Relation Age of Onset  . Hypertension Mother   . Hypertension Father      Review of Systems  Constitutional: Positive for chills. Negative for fever and malaise/fatigue.  HENT: Positive for sore throat.   Eyes: Negative for blurred vision, double vision, discharge and redness.  Respiratory: Negative.  Negative for cough and shortness of breath.   Cardiovascular: Negative.  Negative for chest pain and palpitations.  Gastrointestinal: Positive for nausea and vomiting.  Genitourinary: Negative.  Negative for dysuria and hematuria.  Musculoskeletal: Negative.  Negative for myalgias and neck pain.  Skin: Negative for rash.  Neurological: Negative.  Negative for dizziness and headaches.  Endo/Heme/Allergies: Negative.   All other systems reviewed and are negative.  Vitals:   05/29/17 1216  05/29/17 1222  BP: (!) 151/85 112/60  Pulse: (!) 109   Resp: 16   Temp: 98.2 F (36.8 C)   SpO2: 98%      Physical Exam  Constitutional: She is oriented to person, place, and time. She appears well-developed and well-nourished.  HENT:  Head: Normocephalic and atraumatic.  Right Ear: External ear normal.  Left Ear: External ear normal.  Nose: Nose normal.  Mouth/Throat: Uvula is midline. Posterior oropharyngeal erythema present. No oropharyngeal exudate, posterior oropharyngeal edema or tonsillar abscesses.  Eyes: Pupils are equal, round, and reactive to light. Conjunctivae and EOM are normal.  Neck: Normal range of motion. Neck supple. No JVD present.    Cardiovascular: Normal rate, regular rhythm, normal heart sounds and intact distal pulses.   Pulmonary/Chest: Effort normal and breath sounds normal.  Abdominal: Soft. Bowel sounds are normal. She exhibits no distension. There is no tenderness.  Musculoskeletal: Normal range of motion.  Lymphadenopathy:    She has no cervical adenopathy.  Neurological: She is alert and oriented to person, place, and time. No sensory deficit. She exhibits normal muscle tone.  Skin: Skin is warm and dry. Capillary refill takes less than 2 seconds.  Psychiatric: She has a normal mood and affect. Her behavior is normal.  Vitals reviewed.    ASSESSMENT & PLAN: Alexandra Dorsey was seen today for chills, emesis and medication refill.  Diagnoses and all orders for this visit:  Acute pharyngitis, unspecified etiology  Chills  Non-intractable vomiting with nausea, unspecified vomiting type  Sore throat  Other orders -     amoxicillin-clavulanate (AUGMENTIN) 875-125 MG tablet; Take 1 tablet by mouth 2 (two) times daily. -     ondansetron (ZOFRAN) 4 MG tablet; Take 1 tablet (4 mg total) by mouth every 8 (eight) hours as needed for nausea or vomiting. -     ALPRAZolam (XANAX) 0.5 MG tablet; Take 1 tablet (0.5 mg total) by mouth at bedtime as needed for anxiety.    Patient Instructions       IF you received an x-ray today, you will receive an invoice from [facility]. Please contact [facility] at [phone] with questions or concerns regarding your invoice.   IF you received labwork today, you will receive an invoice from [facility]. Please contact [facility] at [phone] with questions or concerns regarding your invoice.   Our billing staff will not be able to assist you with questions regarding bills from these companies.  You will be contacted with the lab results as soon as they are available. The fastest way to get your results is to activate your My Chart account. Instructions are  located on the last page of this paperwork. If you have not heard from [place] regarding the results in 2 weeks, please contact this office.     Sore Throat When you have a sore throat, your throat may:  Hurt.  Burn.  Feel irritated.  Feel scratchy.  Many things can cause a sore throat, including:  An infection.  Allergies.  Dryness in the air.  Smoke or pollution.  Gastroesophageal reflux disease (GERD).  A tumor.  A sore throat can be the first sign of another sickness. It can happen with other problems, like coughing or a fever. Most sore throats go away without treatment. Follow these instructions at home:  Take over-the-counter medicines only as told by your doctor.  Drink enough fluids to keep your pee (urine) clear or pale yellow.  Rest when you feel you need to.  To  help with pain, try: ? Sipping warm liquids, such as broth, herbal tea, or warm water. ? Eating or drinking cold or frozen liquids, such as frozen ice pops. ? Gargling with a salt-water mixture 3-4 times a day or as needed. To make a salt-water mixture, add -1 tsp of salt in 1 cup of warm water. Mix it until you cannot see the salt anymore. ? Sucking on hard candy or throat lozenges. ? Putting a cool-mist humidifier in your bedroom at night. ? Sitting in the bathroom with the door closed for 5-10 minutes while you run hot water in the shower.  Do not use any tobacco products, such as cigarettes, chewing tobacco, and e-cigarettes. If you need help quitting, ask your doctor. Contact a doctor if:  You have a fever for more than 2-3 days.  You keep having symptoms for more than 2-3 days.  Your throat does not get better in 7 days.  You have a fever and your symptoms suddenly get worse. Get help right away if:  You have trouble breathing.  You cannot swallow fluids, soft foods, or your saliva.  You have swelling in your throat or neck that gets worse.  You keep feeling like you are going to  throw up (vomit).  You keep throwing up. This information is not intended to replace advice given to you by your health care provider. Make sure you discuss any questions you have with your health care provider. Document Released: 07/12/2008 Document Revised: 05/29/2016 Document Reviewed: 07/24/2015 Elsevier Interactive Patient Education  2018 Elsevier Inc.      Edwina Barth, MD [facility]

## 2017-05-29 NOTE — Patient Instructions (Addendum)
     IF you received an x-ray today, you will receive an invoice from [facility]. Please contact [facility] at [phone] with questions or concerns regarding your invoice.   IF you received labwork today, you will receive an invoice from [facility]. Please contact [facility] at [phone] with questions or concerns regarding your invoice.   Our billing staff will not be able to assist you with questions regarding bills from these companies.  You will be contacted with the lab results as soon as they are available. The fastest way to get your results is to activate your My Chart account. Instructions are located on the last page of this paperwork. If you have not heard from us regarding the results in 2 weeks, please contact this office.     Sore Throat When you have a sore throat, your throat may:  Hurt.  Burn.  Feel irritated.  Feel scratchy.  Many things can cause a sore throat, including:  An infection.  Allergies.  Dryness in the air.  Smoke or pollution.  Gastroesophageal reflux disease (GERD).  A tumor.  A sore throat can be the first sign of another sickness. It can happen with other problems, like coughing or a fever. Most sore throats go away without treatment. Follow these instructions at home:  Take over-the-counter medicines only as told by your doctor.  Drink enough fluids to keep your pee (urine) clear or pale yellow.  Rest when you feel you need to.  To help with pain, try: ? Sipping warm liquids, such as broth, herbal tea, or warm water. ? Eating or drinking cold or frozen liquids, such as frozen ice pops. ? Gargling with a salt-water mixture 3-4 times a day or as needed. To make a salt-water mixture, add -1 tsp of salt in 1 cup of warm water. Mix it until you cannot see the salt anymore. ? Sucking on hard candy or throat lozenges. ? Putting a cool-mist humidifier in your bedroom at night. ? Sitting in the bathroom with the  door closed for 5-10 minutes while you run hot water in the shower.  Do not use any tobacco products, such as cigarettes, chewing tobacco, and e-cigarettes. If you need help quitting, ask your doctor. Contact a doctor if:  You have a fever for more than 2-3 days.  You keep having symptoms for more than 2-3 days.  Your throat does not get better in 7 days.  You have a fever and your symptoms suddenly get worse. Get help right away if:  You have trouble breathing.  You cannot swallow fluids, soft foods, or your saliva.  You have swelling in your throat or neck that gets worse.  You keep feeling like you are going to throw up (vomit).  You keep throwing up. This information is not intended to replace advice given to you by your health care provider. Make sure you discuss any questions you have with your health care provider. Document Released: 07/12/2008 Document Revised: 05/29/2016 Document Reviewed: 07/24/2015 Elsevier Interactive Patient Education  2018 Elsevier Inc.  

## 2017-05-30 NOTE — Telephone Encounter (Signed)
Looks like this was already addressed on 05/29/17

## 2017-06-06 ENCOUNTER — Emergency Department (HOSPITAL_COMMUNITY): Payer: BLUE CROSS/BLUE SHIELD

## 2017-06-06 ENCOUNTER — Observation Stay (HOSPITAL_COMMUNITY)
Admission: EM | Admit: 2017-06-06 | Discharge: 2017-06-07 | Disposition: A | Discharge status: Home / Self Care | Payer: BLUE CROSS/BLUE SHIELD | Attending: Family Medicine | Admitting: Family Medicine

## 2017-06-06 ENCOUNTER — Encounter (HOSPITAL_COMMUNITY): Payer: Self-pay

## 2017-06-06 DIAGNOSIS — I10 Essential (primary) hypertension: Secondary | ICD-10-CM | POA: Diagnosis not present

## 2017-06-06 DIAGNOSIS — E872 Acidosis, unspecified: Secondary | ICD-10-CM

## 2017-06-06 DIAGNOSIS — R4182 Altered mental status, unspecified: Secondary | ICD-10-CM | POA: Diagnosis present

## 2017-06-06 DIAGNOSIS — Z7952 Long term (current) use of systemic steroids: Secondary | ICD-10-CM | POA: Insufficient documentation

## 2017-06-06 DIAGNOSIS — F10239 Alcohol dependence with withdrawal, unspecified: Secondary | ICD-10-CM | POA: Diagnosis not present

## 2017-06-06 DIAGNOSIS — E876 Hypokalemia: Secondary | ICD-10-CM | POA: Diagnosis not present

## 2017-06-06 DIAGNOSIS — K219 Gastro-esophageal reflux disease without esophagitis: Secondary | ICD-10-CM | POA: Diagnosis not present

## 2017-06-06 DIAGNOSIS — Z87891 Personal history of nicotine dependence: Secondary | ICD-10-CM | POA: Insufficient documentation

## 2017-06-06 DIAGNOSIS — Z888 Allergy status to other drugs, medicaments and biological substances status: Secondary | ICD-10-CM | POA: Insufficient documentation

## 2017-06-06 DIAGNOSIS — F410 Panic disorder [episodic paroxysmal anxiety] without agoraphobia: Secondary | ICD-10-CM | POA: Insufficient documentation

## 2017-06-06 DIAGNOSIS — Z79899 Other long term (current) drug therapy: Secondary | ICD-10-CM | POA: Insufficient documentation

## 2017-06-06 DIAGNOSIS — Z8249 Family history of ischemic heart disease and other diseases of the circulatory system: Secondary | ICD-10-CM | POA: Diagnosis not present

## 2017-06-06 LAB — COMPREHENSIVE METABOLIC PANEL
ALBUMIN: 4.7 g/dL (ref 3.5–5.0)
ALK PHOS: 36 U/L — AB (ref 38–126)
ALT: 12 U/L — ABNORMAL LOW (ref 14–54)
ANION GAP: 16 — AB (ref 5–15)
AST: 22 U/L (ref 15–41)
BUN: 7 mg/dL (ref 6–20)
CALCIUM: 9.4 mg/dL (ref 8.9–10.3)
CO2: 19 mmol/L — AB (ref 22–32)
Chloride: 107 mmol/L (ref 101–111)
Creatinine, Ser: 0.69 mg/dL (ref 0.44–1.00)
GFR calc non Af Amer: >60 mL/min (ref >60)
Glucose, Bld: 128 mg/dL — ABNORMAL HIGH (ref 65–99)
Potassium: 3.5 mmol/L (ref 3.5–5.1)
SODIUM: 142 mmol/L (ref 135–145)
Total Bilirubin: 0.5 mg/dL (ref 0.3–1.2)
Total Protein: 8 g/dL (ref 6.5–8.1)

## 2017-06-06 LAB — CBC
HCT: 43.6 % (ref 36.0–46.0)
Hemoglobin: 15 g/dL (ref 12.0–15.0)
MCH: 33.5 pg (ref 26.0–34.0)
MCHC: 34.4 g/dL (ref 30.0–36.0)
MCV: 97.3 fL (ref 78.0–100.0)
PLATELETS: 235 10*3/uL (ref 150–400)
RBC: 4.48 MIL/uL (ref 3.87–5.11)
RDW: 13.5 % (ref 11.5–15.5)
WBC: 5.6 10*3/uL (ref 4.0–10.5)

## 2017-06-06 LAB — I-STAT CG4 LACTIC ACID, ED
LACTIC ACID, VENOUS: 5.02 mmol/L — AB (ref 0.5–1.9)
Lactic Acid, Venous: 3.73 mmol/L (ref 0.5–1.9)

## 2017-06-06 LAB — ACETAMINOPHEN LEVEL: Acetaminophen (Tylenol), Serum: <10 ug/mL — ABNORMAL LOW (ref 10–30)

## 2017-06-06 LAB — URINALYSIS, ROUTINE W REFLEX MICROSCOPIC
BILIRUBIN URINE: NEGATIVE
Glucose, UA: NEGATIVE mg/dL
KETONES UR: 5 mg/dL — AB
NITRITE: NEGATIVE
PROTEIN: NEGATIVE mg/dL
Specific Gravity, Urine: 1.015 (ref 1.005–1.030)
pH: 6 (ref 5.0–8.0)

## 2017-06-06 LAB — DIFFERENTIAL
BASOS ABS: 0 10*3/uL (ref 0.0–0.1)
Basophils Relative: 0 %
EOS PCT: 0 %
Eosinophils Absolute: 0 10*3/uL (ref 0.0–0.7)
LYMPHS ABS: 1.3 10*3/uL (ref 0.7–4.0)
LYMPHS PCT: 24 %
Monocytes Absolute: 0.3 10*3/uL (ref 0.1–1.0)
Monocytes Relative: 5 %
NEUTROS ABS: 4 10*3/uL (ref 1.7–7.7)
NEUTROS PCT: 71 %

## 2017-06-06 LAB — MRSA PCR SCREENING: MRSA BY PCR: NEGATIVE

## 2017-06-06 LAB — APTT: APTT: 24 s (ref 24–36)

## 2017-06-06 LAB — CK: Total CK: 105 U/L (ref 38–234)

## 2017-06-06 LAB — RAPID URINE DRUG SCREEN, HOSP PERFORMED
AMPHETAMINES: NOT DETECTED
Barbiturates: NOT DETECTED
Benzodiazepines: POSITIVE — AB
Cocaine: NOT DETECTED
OPIATES: NOT DETECTED
Tetrahydrocannabinol: POSITIVE — AB

## 2017-06-06 LAB — ETHANOL: Alcohol, Ethyl (B): 70 mg/dL — ABNORMAL HIGH (ref <5)

## 2017-06-06 LAB — I-STAT TROPONIN, ED: Troponin i, poc: 0 ng/mL (ref 0.00–0.08)

## 2017-06-06 LAB — LACTIC ACID, PLASMA: LACTIC ACID, VENOUS: 3.1 mmol/L — AB (ref 0.5–1.9)

## 2017-06-06 LAB — PROTIME-INR
INR: 1.06
Prothrombin Time: 13.9 seconds (ref 11.4–15.2)

## 2017-06-06 LAB — I-STAT BETA HCG BLOOD, ED (MC, WL, AP ONLY): I-stat hCG, quantitative: <5 m[IU]/mL (ref <5)

## 2017-06-06 LAB — AMMONIA: AMMONIA: 25 umol/L (ref 9–35)

## 2017-06-06 LAB — PHOSPHORUS: PHOSPHORUS: 2.3 mg/dL — AB (ref 2.5–4.6)

## 2017-06-06 LAB — SALICYLATE LEVEL

## 2017-06-06 LAB — TSH: TSH: 5.403 u[IU]/mL — ABNORMAL HIGH (ref 0.350–4.500)

## 2017-06-06 LAB — MAGNESIUM: Magnesium: 1.7 mg/dL (ref 1.7–2.4)

## 2017-06-06 MED ORDER — ADULT MULTIVITAMIN W/MINERALS CH
1.0000 | ORAL_TABLET | Freq: Every day | ORAL | Status: DC
  Administered 2017-06-06 – 2017-06-07 (×2): 1 via ORAL
  Filled 2017-06-06 (×2): qty 1

## 2017-06-06 MED ORDER — ZIPRASIDONE MESYLATE 20 MG IM SOLR: 20.0000 mg | Freq: Once | INTRAMUSCULAR | Status: DC

## 2017-06-06 MED ORDER — SODIUM CHLORIDE 0.9 % IV SOLN
INTRAVENOUS | Status: DC
  Administered 2017-06-06 – 2017-06-07 (×2): via INTRAVENOUS

## 2017-06-06 MED ORDER — SODIUM CHLORIDE 0.9 % IV BOLUS (SEPSIS)
1000.0000 mL | Freq: Once | INTRAVENOUS | Status: AC
  Administered 2017-06-06: 1000 mL via INTRAVENOUS

## 2017-06-06 MED ORDER — ENOXAPARIN SODIUM 40 MG/0.4ML ~~LOC~~ SOLN
40.0000 mg | SUBCUTANEOUS | Status: DC
  Administered 2017-06-06: 40 mg via SUBCUTANEOUS
  Filled 2017-06-06: qty 0.4

## 2017-06-06 MED ORDER — LORAZEPAM 2 MG/ML IJ SOLN
0.0000 mg | Freq: Two times a day (BID) | INTRAMUSCULAR | Status: DC
Start: 2017-06-08 — End: 2017-06-06

## 2017-06-06 MED ORDER — VITAMIN B-1 100 MG PO TABS: 100.0000 mg | ORAL_TABLET | Freq: Every day | ORAL | Status: DC

## 2017-06-06 MED ORDER — LORAZEPAM 1 MG PO TABS: 0.0000 mg | ORAL_TABLET | Freq: Four times a day (QID) | ORAL | Status: DC

## 2017-06-06 MED ORDER — LORAZEPAM 2 MG/ML IJ SOLN
1.0000 mg | INTRAMUSCULAR | Status: DC | PRN
  Administered 2017-06-07: 1 mg via INTRAVENOUS
  Filled 2017-06-06: qty 1

## 2017-06-06 MED ORDER — LORAZEPAM 2 MG/ML IJ SOLN
1.0000 mg | Freq: Three times a day (TID) | INTRAMUSCULAR | Status: DC
  Administered 2017-06-06 – 2017-06-07 (×2): 1 mg via INTRAVENOUS
  Filled 2017-06-06 (×2): qty 1

## 2017-06-06 MED ORDER — ATENOLOL 25 MG PO TABS
12.5000 mg | ORAL_TABLET | Freq: Every day | ORAL | Status: DC
  Administered 2017-06-06 – 2017-06-07 (×2): 12.5 mg via ORAL
  Filled 2017-06-06 (×2): qty 1

## 2017-06-06 MED ORDER — LORAZEPAM 2 MG/ML IJ SOLN
0.0000 mg | Freq: Four times a day (QID) | INTRAMUSCULAR | Status: DC
  Administered 2017-06-06: 2 mg via INTRAVENOUS
  Filled 2017-06-06: qty 1

## 2017-06-06 MED ORDER — LORAZEPAM 1 MG PO TABS: 0.0000 mg | ORAL_TABLET | Freq: Two times a day (BID) | ORAL | Status: DC

## 2017-06-06 MED ORDER — ACETAMINOPHEN 325 MG PO TABS
650.0000 mg | ORAL_TABLET | Freq: Four times a day (QID) | ORAL | Status: DC | PRN
  Administered 2017-06-06 – 2017-06-07 (×2): 650 mg via ORAL
  Filled 2017-06-06 (×2): qty 2

## 2017-06-06 MED ORDER — ACETAMINOPHEN 650 MG RE SUPP: 650.0000 mg | Freq: Four times a day (QID) | RECTAL | Status: DC | PRN

## 2017-06-06 MED ORDER — HYDRALAZINE HCL 20 MG/ML IJ SOLN: 2.0000 mg | INTRAMUSCULAR | Status: DC | PRN

## 2017-06-06 MED ORDER — FOLIC ACID 1 MG PO TABS
1.0000 mg | ORAL_TABLET | Freq: Every day | ORAL | Status: DC
  Administered 2017-06-06 – 2017-06-07 (×2): 1 mg via ORAL
  Filled 2017-06-06 (×2): qty 1

## 2017-06-06 MED ORDER — THIAMINE HCL 100 MG/ML IJ SOLN
100.0000 mg | Freq: Every day | INTRAMUSCULAR | Status: DC
  Administered 2017-06-06: 100 mg via INTRAVENOUS
  Filled 2017-06-06: qty 2

## 2017-06-06 MED ORDER — VITAMIN B-1 100 MG PO TABS
100.0000 mg | ORAL_TABLET | Freq: Every day | ORAL | Status: DC
  Administered 2017-06-06 – 2017-06-07 (×2): 100 mg via ORAL
  Filled 2017-06-06 (×3): qty 1

## 2017-06-06 NOTE — ED Notes (Signed)
Pt ambulatory to the restroom without difficulty.

## 2017-06-06 NOTE — ED Notes (Signed)
PA made aware, IV fluids running slow due to position in left wrist, right hand. Had to place on pump due to pt moving constantly and occluding. Will only infuse at 250 cc/hr.
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
## 2017-06-06 NOTE — Progress Notes (Signed)
CRITICAL VALUE ALERT  Critical Value:  Lactic Acid 3.1  Date & Time Notied:  06/06/2017  2157  Provider Notified: Dr. Jonathon Jordan  Orders Received/Actions taken: Repeat Lactic Acid to trend.   Noe Gens, RN
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
## 2017-06-06 NOTE — ED Triage Notes (Signed)
Pt presents via [facility] for evaluation of altered mental status this AM. Pt LKW around 2000 last night. Pt repeats name to all questions. Able to follow commands.

## 2017-06-06 NOTE — ED Notes (Signed)
Pt states she drinks about 1-2 beers a day, pt cant remember what she drank last night/this morning

## 2017-06-06 NOTE — ED Notes (Signed)
Pt ambulatory to bathroom without any problems 

## 2017-06-06 NOTE — ED Notes (Signed)
Admitting MD spoke with pt and pt agrees to stay

## 2017-06-06 NOTE — ED Notes (Signed)
Pt stating she wants to go home and does not want to stay tonight, admitting MD paged

## 2017-06-07 DIAGNOSIS — I10 Essential (primary) hypertension: Secondary | ICD-10-CM | POA: Diagnosis not present

## 2017-06-07 DIAGNOSIS — R4182 Altered mental status, unspecified: Secondary | ICD-10-CM | POA: Diagnosis not present

## 2017-06-07 DIAGNOSIS — E872 Acidosis: Secondary | ICD-10-CM | POA: Diagnosis not present

## 2017-06-07 DIAGNOSIS — F10239 Alcohol dependence with withdrawal, unspecified: Secondary | ICD-10-CM | POA: Diagnosis not present

## 2017-06-07 LAB — COMPREHENSIVE METABOLIC PANEL
ALBUMIN: 3.6 g/dL (ref 3.5–5.0)
ALT: 10 U/L — ABNORMAL LOW (ref 14–54)
AST: 19 U/L (ref 15–41)
Alkaline Phosphatase: 28 U/L — ABNORMAL LOW (ref 38–126)
Anion gap: 8 (ref 5–15)
BUN: 5 mg/dL — AB (ref 6–20)
CHLORIDE: 106 mmol/L (ref 101–111)
CO2: 24 mmol/L (ref 22–32)
Calcium: 8.4 mg/dL — ABNORMAL LOW (ref 8.9–10.3)
Creatinine, Ser: 0.63 mg/dL (ref 0.44–1.00)
GFR calc Af Amer: >60 mL/min (ref >60)
GFR calc non Af Amer: >60 mL/min (ref >60)
GLUCOSE: 92 mg/dL (ref 65–99)
POTASSIUM: 2.9 mmol/L — AB (ref 3.5–5.1)
Sodium: 138 mmol/L (ref 135–145)
Total Bilirubin: 0.8 mg/dL (ref 0.3–1.2)
Total Protein: 6.1 g/dL — ABNORMAL LOW (ref 6.5–8.1)

## 2017-06-07 LAB — LACTIC ACID, PLASMA
LACTIC ACID, VENOUS: 1.8 mmol/L (ref 0.5–1.9)
Lactic Acid, Venous: 2.2 mmol/L (ref 0.5–1.9)
Lactic Acid, Venous: 2.2 mmol/L (ref 0.5–1.9)
Lactic Acid, Venous: 2.9 mmol/L (ref 0.5–1.9)

## 2017-06-07 LAB — HIV ANTIBODY (ROUTINE TESTING W REFLEX): HIV SCREEN 4TH GENERATION: NONREACTIVE

## 2017-06-07 LAB — CBC
HEMATOCRIT: 36.7 % (ref 36.0–46.0)
Hemoglobin: 12.5 g/dL (ref 12.0–15.0)
MCH: 34.1 pg — ABNORMAL HIGH (ref 26.0–34.0)
MCHC: 34.1 g/dL (ref 30.0–36.0)
MCV: 100 fL (ref 78.0–100.0)
Platelets: 223 10*3/uL (ref 150–400)
RBC: 3.67 MIL/uL — ABNORMAL LOW (ref 3.87–5.11)
RDW: 13.8 % (ref 11.5–15.5)
WBC: 7.3 10*3/uL (ref 4.0–10.5)

## 2017-06-07 MED ORDER — LOPERAMIDE HCL 2 MG PO CAPS
2.0000 mg | ORAL_CAPSULE | ORAL | Status: DC | PRN
  Administered 2017-06-07: 2 mg via ORAL
  Filled 2017-06-07: qty 1

## 2017-06-07 MED ORDER — POTASSIUM CHLORIDE CRYS ER 20 MEQ PO TBCR: 40.0000 meq | EXTENDED_RELEASE_TABLET | Freq: Two times a day (BID) | ORAL | Status: DC

## 2017-06-07 MED ORDER — POTASSIUM CHLORIDE CRYS ER 20 MEQ PO TBCR
40.0000 meq | EXTENDED_RELEASE_TABLET | Freq: Two times a day (BID) | ORAL | Status: DC
  Administered 2017-06-07: 40 meq via ORAL
  Filled 2017-06-07: qty 2

## 2017-06-07 NOTE — Progress Notes (Signed)
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 This appears to be a change.  Messaged attending MD

## 2017-06-07 NOTE — Progress Notes (Signed)
Family Medicine Teaching Service Daily Progress Note Intern Pager: [phone]  Patient name: Alexandra Dorsey Medical record number: [id number] Date of birth: 01-16-1976 Age: 41 y.o. Gender: female  Primary Care Provider: Doristine Bosworth, MD Consultants: None Code Status: Full (assessed at bedside today)  Pt Overview and Major Events to Date:  Alexandra Dorsey is a 41 y.o. female presenting with 1 day of AMS. PMH is significant for Alcohol Withdrawal, HTN, GERD, Anxiety/Panic attacks  Assessment and Plan: Alexandra Dorsey is a 41 y.o. female presenting with 1 day of AMS. PMH is significant for Alcohol Withdrawal, HTN, GERD, Anxiety/Panic attacks  AMS likely due to Alcohol withdrawl. Improved. A&O4. CIWA 1 o/n. VSS.  - telemetry - CIWA protocol,  - ativan prn CIWA >9,  - consider d/c Standing Ativan q8H - vital signs and pulse ox - neuro checks q4H - tylenol prn - reduce sedating meds as possible, except ativan per CIWA protocol - advance diet as tolerated -transfer to medsurg  Foot pain, secondary to dropping something on her foot.  - prn tylenol   Hypokalemia. 2.9 in AM.  - replete as necessary  Anxiety. Chronic. Pt takes trazadone and xanax at home.  - hold home meds  - CIWA protocol  HTN. Stable.. Hypertensive on admission.chronic. Pt record shows on amlodipine and atenolol at home. Pt states she does not take amlodipine b/c it makes her cough. She did not take atenolol today.  - cont atenolol - hydral PRN SBP >180, DBP>100  GERD. Pt records shows she takes zantac.  -consider adding zantac if she develops epigastric pain  FEN/GI: ADAT, mIVF @ 75 mL/hr Prophylaxis: LMWH  Disposition: transfer to med surg Subjective:  Alert, resting in bed.Endorses mild ha. Wanting to leave. Concerned about sharing medical diagnosis with other family, but said is was ok.   Objective: Temp:  [98.5 F (36.9 C)-99.1 F (37.3 C)] 98.8 F (37.1 C) (08/22 0300) Pulse Rate:   [80-134] 86 (08/22 0000) Resp:  [12-20] 17 (08/21 2344) BP: (110-158)/(75-117) 110/75 (08/22 0000) SpO2:  [93 %-100 %] 99 % (08/21 2344) Weight:  [122 lb 5.7 oz (55.5 kg)] 122 lb 5.7 oz (55.5 kg) (08/21 2035) Physical Exam: General: NAD, resting comfortably Eyes: PERRL, non-icteric Cardiovascular: rrr, no mrg Respiratory: ctab, no increased work of breathing Abdomen: soft, nontender, nondistended Neuro: improved, AxOx4, CN II-XII grossly intact, no asterixis on exam today   Laboratory:  Recent Labs Lab 06/06/17 1038 06/07/17 0358  WBC 5.6 7.3  HGB 15.0 12.5  HCT 43.6 36.7  PLT 235 223    Recent Labs Lab 06/06/17 1038 06/07/17 0358  NA 142 138  K 3.5 2.9*  CL 107 106  CO2 19* 24  BUN 7 5*  CREATININE 0.69 0.63  CALCIUM 9.4 8.4*  PROT 8.0 6.1*  BILITOT 0.5 0.8  ALKPHOS 36* 28*  ALT 12* 10*  AST 22 19  GLUCOSE 128* 92      Imaging/Diagnostic Tests: Ct Head Wo Contrast  Result Date: 06/06/2017 CLINICAL DATA:  Altered mental status. EXAM: CT HEAD WITHOUT CONTRAST TECHNIQUE: Contiguous axial images were obtained from the base of the skull through the vertex without intravenous contrast. COMPARISON:  Head CT dated January 02, 2014. FINDINGS: Examination is mildly degraded by motion artifact. Brain: No evidence of acute infarction, hemorrhage, hydrocephalus, extra-axial collection or mass lesion/mass effect. Vascular: No hyperdense vessel or unexpected calcification. Skull: Normal. Negative for fracture or focal lesion. Sinuses/Orbits: The bilateral paranasal sinuses and mastoid air cells are  clear. The orbits are unremarkable. Other: None. IMPRESSION: Normal noncontrast head CT. Electronically Signed   By: Obie Dredge M.D.   On: 06/06/2017 11:04   Dg Chest Portable 1 View  Result Date: 06/06/2017 CLINICAL DATA:  Cough with possible withdrawal seizure. Altered mental status. EXAM: PORTABLE CHEST 1 VIEW COMPARISON:  08/10/2013. FINDINGS: The heart size and mediastinal  contours are within normal limits. Both lungs are clear. The visualized skeletal structures are unremarkable. IMPRESSION: Negative exam.  Similar appearance to priors. Electronically Signed   By: Elsie Stain M.D.   On: 06/06/2017 14:23   Dg Foot 2 Views Left  Result Date: 06/06/2017 CLINICAL DATA:  41 y/o  F; injury to left great toe 3 days ago. EXAM: LEFT FOOT - 2 VIEW COMPARISON:  None. FINDINGS: There is no evidence of fracture or dislocation. There is no evidence of arthropathy or other focal bone abnormality. Soft tissues are unremarkable. IMPRESSION: Negative. Electronically Signed   By: Mitzi Hansen M.D.   On: 06/06/2017 14:26    Garnette Gunner, MD 06/07/2017, 7:21 AM PGY-1, [facility] Family Medicine FPTS Intern pager: [phone], text pages welcome

## 2017-06-07 NOTE — Progress Notes (Signed)
AVS, meds, f/u instructions gone over w/ pt. Pt states no questions or concerns. Pt d/c'd to home w/ family member

## 2017-06-07 NOTE — Plan of Care (Signed)
Problem: Education: Goal: Knowledge of [facility] Education information/materials will improve Outcome: Progressing Discussed plan of care with patient regarding her treatment. Aware that we are rehydrating her and monitoring her for AMS/ CIWA. Discussed that she would be here overnight until the doctors can reassess her. Patient declines pneumonia vaccine.   Problem: Safety: Goal: Ability to remain free from injury will improve Outcome: Progressing Bed alarm utilized- call light within reach- oriented patient to room.

## 2017-06-07 NOTE — Progress Notes (Signed)
Lactic acid now 2.9. Messaged attending MD

## 2017-06-07 NOTE — Progress Notes (Signed)
CRITICAL VALUE ALERT  Critical Value:  Lactic acid 2.2  Date & Time Notied:  06/07/17 5:30 AM   Provider Notified: FMTS  Orders Received/Actions taken:

## 2017-06-08 NOTE — Discharge Summary (Signed)
Family Medicine Teaching [facility] Discharge Summary  Patient name: Alexandra Dorsey Medical record number: [id number] Date of birth: [birth year] Age: 41 y.o. Gender: female Date of Admission: 06/06/2017  Date of Discharge: 06/07/2017 Admitting Physician: Nestor Ramp, MD  Primary Care Provider: Doristine Bosworth, MD Consultants: None  Indication for Hospitalization: AMS 2/2 alcohol withdrawl  Discharge Diagnoses/Problem List:  AMS 2/2 alcohol withdrawal Foot pain, 2/2 dropping object on foot Hypokalemia, resolved Anxiety HTN GERD  Disposition: home  Discharge Condition: stable   Discharge Exam:  General: NAD, resting comfortably Eyes: PERRL, non-icteric Cardiovascular: rrr, no mrg Respiratory: ctab, no increased work of breathing Abdomen: soft, nontender, nondistended Neuro: improved, AxOx4, CN II-XII grossly intact, no asterixis on exam today   Brief Hospital Course:   Alexandra Dorsey is a 41 y.o. female presenting with 1 day of AMS. On admission to the ED, CIWA 23. She states that she did not have a seizure, but aunt at bedside saw her foaming from the mouth. UDS positive for alcohol and THC. Pt was afebrile, hypertensive 150s/100s and tachycardic 120s. Labs on admission significant for Bicarb 19, Glu 128, Anion Gap 16. Lactic Acid 5.02. Negative troponins. CT head was negative for intracranial bleeding. CXR Left foot no evidence of fx. Neurologically intact on admission. During her stay, patient improved with fluid resuscitation and ativan prn CIWA protocol. By the following morning, AMS had resolved. Lactic acid persisted elevated, but improved at 2.9. Patient was afebrile during stay with no active signs of infectionRemaining labs had returned to wnl. Patient was discharged with stable vitals to home.   Issues for Follow Up:  1. Alcochol abuse  - Pt refused any counseling or out patient services for alcohol related treatment. Patient only endorsed drinking 2 beers/  day, but while altered endorsed up to 10. I suspect that the later number is closer to actually given presentation and UDS. Please follow up on patient's alcohol intake and possible further treatment.  2. Elevated lactic acid - Pt had an elevated lactic acid without acidosis. Etiology unknown. Please consider rechecking if patient develops any sign or symptoms of infection or acute withdrawal.  Significant Procedures: None  Significant Labs and Imaging:   Recent Labs Lab 06/06/17 1038 06/07/17 0358  WBC 5.6 7.3  HGB 15.0 12.5  HCT 43.6 36.7  PLT 235 223    Recent Labs Lab 06/06/17 1038 06/06/17 2100 06/07/17 0358  NA 142  --  138  K 3.5  --  2.9*  CL 107  --  106  CO2 19*  --  24  GLUCOSE 128*  --  92  BUN 7  --  5*  CREATININE 0.69  --  0.63  CALCIUM 9.4  --  8.4*  MG  --  1.7  --   PHOS  --  2.3*  --   ALKPHOS 36*  --  28*  AST 22  --  19  ALT 12*  --  10*  ALBUMIN 4.7  --  3.6    Ct Head Wo Contrast  Result Date: 06/06/2017 CLINICAL DATA:  Altered mental status. EXAM: CT HEAD WITHOUT CONTRAST TECHNIQUE: Contiguous axial images were obtained from the base of the skull through the vertex without intravenous contrast. COMPARISON:  Head CT dated January 02, 2014. FINDINGS: Examination is mildly degraded by motion artifact. Brain: No evidence of acute infarction, hemorrhage, hydrocephalus, extra-axial collection or mass lesion/mass effect. Vascular: No hyperdense vessel or unexpected calcification. Skull: Normal. Negative for fracture or  focal lesion. Sinuses/Orbits: The bilateral paranasal sinuses and mastoid air cells are clear. The orbits are unremarkable. Other: None. IMPRESSION: Normal noncontrast head CT. Electronically Signed   By: Obie Dredge M.D.   On: 06/06/2017 11:04   Dg Chest Portable 1 View  Result Date: 06/06/2017 CLINICAL DATA:  Cough with possible withdrawal seizure. Altered mental status. EXAM: PORTABLE CHEST 1 VIEW COMPARISON:  08/10/2013. FINDINGS: The  heart size and mediastinal contours are within normal limits. Both lungs are clear. The visualized skeletal structures are unremarkable. IMPRESSION: Negative exam.  Similar appearance to priors. Electronically Signed   By: Elsie Stain M.D.   On: 06/06/2017 14:23   Dg Foot 2 Views Left  Result Date: 06/06/2017 CLINICAL DATA:  41 y/o  F; injury to left great toe 3 days ago. EXAM: LEFT FOOT - 2 VIEW COMPARISON:  None. FINDINGS: There is no evidence of fracture or dislocation. There is no evidence of arthropathy or other focal bone abnormality. Soft tissues are unremarkable. IMPRESSION: Negative. Electronically Signed   By: Mitzi Hansen M.D.   On: 06/06/2017 14:26    Results/Tests Pending at Time of Discharge: None  Discharge Medications:  Allergies as of 06/07/2017      Reactions   Lisinopril Cough   Norvasc [amlodipine Besylate] Other (See Comments)   Feet hurts      Medication List    STOP taking these medications   amLODipine 10 MG tablet Commonly known as:  NORVASC     TAKE these medications   ALPRAZolam 0.5 MG tablet Commonly known as:  XANAX Take 1 tablet (0.5 mg total) by mouth at bedtime as needed for anxiety. What changed:  Another medication with the same name was removed. Continue taking this medication, and follow the directions you see here.   atenolol 25 MG tablet Commonly known as:  TENORMIN Take 0.5 tablets (12.5 mg total) by mouth daily. For one week. Increase to one tablet daily by week 2. What changed:  how much to take  additional instructions   fluticasone 50 MCG/ACT nasal spray Commonly known as:  FLONASE Place 1 spray into both nostrils daily.   ondansetron 4 MG tablet Commonly known as:  ZOFRAN Take 1 tablet (4 mg total) by mouth every 8 (eight) hours as needed for nausea or vomiting.   ranitidine 150 MG tablet Commonly known as:  ZANTAC Take 1 tablet (150 mg total) by mouth 2 (two) times daily.   traZODone 50 MG tablet Commonly  known as:  DESYREL Take 0.5-1 tablets (25-50 mg total) by mouth at bedtime as needed for sleep.       Discharge Instructions: Please refer to Patient Instructions section of EMR for full details.  Patient was counseled important signs and symptoms that should prompt return to medical care, changes in medications, dietary instructions, activity restrictions, and follow up appointments.   Follow-Up Appointments: Follow-up Information    Doristine Bosworth, MD. Schedule an appointment as soon as possible for a visit in 1 week(s).   Specialty:  Internal Medicine Contact information: [address] [place] [address] [phone]           Garnette Gunner, MD 06/08/2017, 2:35 PM PGY-1, [facility] Family [facility]

## 2017-06-15 ENCOUNTER — Telehealth: Payer: Self-pay | Admitting: Family Medicine

## 2017-06-15 NOTE — Telephone Encounter (Signed)
Pt is needing to get a refill on her blood pressure medicaiton atenolol   Best number [phone]

## 2017-06-16 ENCOUNTER — Ambulatory Visit: Payer: BLUE CROSS/BLUE SHIELD | Admitting: Family Medicine

## 2017-06-20 ENCOUNTER — Other Ambulatory Visit: Payer: Self-pay | Admitting: Family Medicine

## 2017-06-21 NOTE — Telephone Encounter (Signed)
Okay to refill this

## 2017-06-22 NOTE — Telephone Encounter (Signed)
Refill request for atenolol 25 mg tab approved for # 90 with no refills, pt needs f/u appt with dr Creta Levinstallings- will send to scheduling to call pt and setup appt.

## 2017-06-26 NOTE — Telephone Encounter (Signed)
MyChart message sent to pt about making an apt for more refills °

## 2017-06-28 NOTE — Telephone Encounter (Signed)
06/22/17 refilled medication

## 2017-09-01 ENCOUNTER — Ambulatory Visit: Payer: BLUE CROSS/BLUE SHIELD | Admitting: Physician Assistant

## 2017-09-01 ENCOUNTER — Ambulatory Visit (INDEPENDENT_AMBULATORY_CARE_PROVIDER_SITE_OTHER): Payer: BLUE CROSS/BLUE SHIELD

## 2017-09-01 ENCOUNTER — Other Ambulatory Visit: Payer: Self-pay

## 2017-09-01 ENCOUNTER — Encounter: Payer: Self-pay | Admitting: Physician Assistant

## 2017-09-01 VITALS — BP 150/100 | HR 86 | Temp 99.0°F | Resp 16 | Ht 61.0 in | Wt 127.2 lb

## 2017-09-01 DIAGNOSIS — F411 Generalized anxiety disorder: Secondary | ICD-10-CM | POA: Diagnosis not present

## 2017-09-01 DIAGNOSIS — I10 Essential (primary) hypertension: Secondary | ICD-10-CM | POA: Diagnosis not present

## 2017-09-01 DIAGNOSIS — R030 Elevated blood-pressure reading, without diagnosis of hypertension: Secondary | ICD-10-CM | POA: Diagnosis not present

## 2017-09-01 DIAGNOSIS — R109 Unspecified abdominal pain: Secondary | ICD-10-CM | POA: Diagnosis not present

## 2017-09-01 LAB — POCT URINALYSIS DIP (MANUAL ENTRY)
Bilirubin, UA: NEGATIVE
Glucose, UA: NEGATIVE mg/dL
Ketones, POC UA: NEGATIVE mg/dL
Leukocytes, UA: NEGATIVE
Nitrite, UA: NEGATIVE
Protein Ur, POC: NEGATIVE mg/dL
Spec Grav, UA: <=1.005 — AB (ref 1.010–1.025)
Urobilinogen, UA: NEGATIVE U/dL — AB
pH, UA: 5 (ref 5.0–8.0)

## 2017-09-01 LAB — POC MICROSCOPIC URINALYSIS (UMFC): Mucus: ABSENT

## 2017-09-01 MED ORDER — ALPRAZOLAM 0.5 MG PO TABS: 0.5000 mg | ORAL_TABLET | Freq: Every evening | ORAL | 0 refills | Status: DC | PRN

## 2017-09-01 MED ORDER — TAMSULOSIN HCL 0.4 MG PO CAPS: 0.4000 mg | ORAL_CAPSULE | Freq: Every day | ORAL | 0 refills | Status: DC

## 2017-09-01 MED ORDER — ATENOLOL 50 MG PO TABS: ORAL_TABLET | ORAL | 1 refills | Status: DC

## 2017-09-01 MED ORDER — TRAMADOL-ACETAMINOPHEN 37.5-325 MG PO TABS: 1.0000 | ORAL_TABLET | Freq: Four times a day (QID) | ORAL | 0 refills | Status: DC | PRN

## 2017-09-01 NOTE — Progress Notes (Signed)
Alexandra Dorsey  MRN: [id number] DOB: [birth year]  PCP: Doristine BosworthStallings, Zoe A, MD  Subjective:  Pt is a 41 year old female who presents to clinic for right sided flank pain x 3 days.  Pain started right flank and has since moved down and then towards the front right lower quadrant. Pain is consistent. Denies fever, chills, blood in her urine, n/v.  [facility] powder, ibuprofen.  H/o kidney stones. Has not had one in several years.   HTN - Uncontrolled. Atenolol 50mg  qd. BP today is 150/100. She needs refill of blood pressure medication. Denies headache, chest pain, palpitations.   GAD - needs refill of Xanax 0.5 mg. Last refill two months ago. #20  Review of Systems  Constitutional: Negative for chills, diaphoresis, fatigue and fever.  Gastrointestinal: Positive for abdominal pain. Negative for nausea and vomiting.  Genitourinary: Positive for flank pain. Negative for decreased urine volume, difficulty urinating, dysuria, frequency, hematuria and urgency.  Psychiatric/Behavioral: Negative for sleep disturbance.    Patient Active Problem List   Diagnosis Date Noted  . Altered mental status 06/06/2017  . Alcohol dependence with withdrawal with complication (HCC)   . Lactic acidosis   . Chills 05/29/2017  . Non-intractable vomiting with nausea 05/29/2017  . Acute pharyngitis 05/29/2017  . Sore throat 05/29/2017  . Essential hypertension 11/13/2016  . Panic attacks 11/13/2016    Current Outpatient Medications on File Prior to Visit  Medication Sig Dispense Refill  . ALPRAZolam (XANAX) 0.5 MG tablet Take 1 tablet (0.5 mg total) by mouth at bedtime as needed for anxiety. 20 tablet 0  . atenolol (TENORMIN) 25 MG tablet take 1/2 tablets by mouth once daily for 1 week INCREASE TO 1 tablet daily BY WEEK 2 90 tablet 0  . fluticasone (FLONASE) 50 MCG/ACT nasal spray Place 1 spray into both nostrils daily. 16 g 1  . ondansetron (ZOFRAN) 4 MG tablet Take 1 tablet (4 mg total) by mouth  every 8 (eight) hours as needed for nausea or vomiting. 12 tablet 0  . ranitidine (ZANTAC) 150 MG tablet Take 1 tablet (150 mg total) by mouth 2 (two) times daily. 60 tablet 2  . traZODone (DESYREL) 50 MG tablet Take 0.5-1 tablets (25-50 mg total) by mouth at bedtime as needed for sleep. 30 tablet 3   No current facility-administered medications on file prior to visit.     Allergies  Allergen Reactions  . Lisinopril Cough  . Norvasc [Amlodipine Besylate] Other (See Comments)    Feet hurts     Objective:  BP (!) 140/100   Pulse 86   Temp 99 F (37.2 C) (Oral)   Resp 16   Ht 5\' 1"  (1.549 m)   Wt 127 lb 3.2 oz (57.7 kg)   LMP 08/02/2017   SpO2 99%   BMI 24.03 kg/m   Physical Exam  Constitutional: She is oriented to person, place, and time and well-developed, well-nourished, and in no distress. No distress.  Cardiovascular: Normal rate, regular rhythm and normal heart sounds.  Abdominal: Soft. Normal appearance. There is tenderness in the right lower quadrant. There is no rigidity, no guarding and no CVA tenderness.  Neurological: She is alert and oriented to person, place, and time. GCS score is 15.  Skin: Skin is warm and dry.  Psychiatric: Mood, memory, affect and judgment normal.  Vitals reviewed.  Results for orders placed or performed in visit on 09/01/17  POCT urinalysis dipstick  Result Value Ref Range   Color,  UA yellow yellow   Clarity, UA clear clear   Glucose, UA negative negative mg/dL   Bilirubin, UA negative negative   Ketones, POC UA negative negative mg/dL   Spec Grav, UA <=4.098<=1.005 (A) 1.010 - 1.025   Blood, UA small (A) negative   pH, UA 5.0 5.0 - 8.0   Protein Ur, POC negative negative mg/dL   Urobilinogen, UA negative (A) 0.2 or 1.0 E.U./dL   Nitrite, UA Negative Negative   Leukocytes, UA Negative Negative  POCT Microscopic Urinalysis ([facility])  Result Value Ref Range   WBC,UR,HPF,POC Few (A) None WBC/hpf   RBC,UR,HPF,POC None None RBC/hpf   Bacteria  None None, Too numerous to count   Mucus Absent Absent   Epithelial Cells, UR Per Microscopy Moderate (A) None, Too numerous to count cells/hpf   Dg Abd 1 View  Result Date: 09/01/2017 CLINICAL DATA:  flank pain x 2 days. r/o nephrolithiasis. Hx of kidney stone. EXAM: ABDOMEN - 1 VIEW COMPARISON:  CT pelvis dated 01/02/2014. FINDINGS: No convincing renal or ureteral stone identified. Several small calcifications are seen within the lower pelvis, grossly stable compared to the earlier CT suggesting chronic small vascular phleboliths. Bowel gas pattern is nonobstructive. No evidence of soft tissue mass or abnormal fluid collection. No evidence of free intraperitoneal air. No acute or suspicious osseous finding. IMPRESSION: Negative. No convincing renal or ureteral calculi identified. Small calcifications within the pelvis are grossly stable compared to the earlier pelvis CT suggesting chronic vascular phleboliths. Electronically Signed   By: Bary RichardStan  Maynard M.D.   On: 09/01/2017 15:28    Assessment and Plan :  1. Flank pain - Urine Culture - POCT urinalysis dipstick - POCT Microscopic Urinalysis ([facility]) - DG Abd 1 View; Future - tamsulosin (FLOMAX) 0.4 MG CAPS capsule; Take 1 capsule (0.4 mg total) daily by mouth. Take 30 min after same meal daily.  Dispense: 30 capsule; Refill: 0 - traMADol-acetaminophen (ULTRACET) 37.5-325 MG tablet; Take 1 tablet every 6 (six) hours as needed by mouth.  Dispense: 20 tablet; Refill: 0 - No renal stones noted on x-ray. UA +blood. Plan to treat for nephrolithiasis. Seems to be moving toward pelvis - no concern for blockage at this time. Urine strainer provided. RTC if no improvement.  2. Elevated blood pressure reading 3. Essential hypertension - Recheck vitals - atenolol (TENORMIN) 50 MG tablet; take 1 tablet daily  Dispense: 90 tablet; Refill: 1 - Uncontrolled HTN. Increase atenolol to 100mg  qd. RTC in 3-4 weeks for recheck. DASH diet encouraged.  4. Generalized  anxiety disorder - ALPRAZolam (XANAX) 0.5 MG tablet; Take 1 tablet (0.5 mg total) at bedtime as needed by mouth for anxiety.  Dispense: 20 tablet; Refill: 0   Marco CollieWhitney Erique Kaser, PA-C  [facility] 09/01/2017 2:57 PM

## 2017-09-01 NOTE — Patient Instructions (Addendum)
Stay well hydrated. Take Flomax daily. Use urine strainer until you pass stone. You can bring it back to the clinic and we can send it to the lab for testing.   Come back and see me in 4 weeks for blood pressure recheck.   Thank you for coming in today. I hope you feel we met your needs.  Feel free to call PCP if you have any questions or further requests.  Please consider signing up for MyChart if you do not already have it, as this is a great way to communicate with me.  Best,  Whitney McVey, PA-C   Kidney Stones Kidney stones (urolithiasis) are solid, rock-like deposits that form inside of the organs that make urine (kidneys). A kidney stone may form in a kidney and move into the bladder, where it can cause intense pain and block the flow of urine. Kidney stones are created when high levels of certain minerals are found in the urine. They are usually passed through urination, but in some cases, medical treatment may be needed to remove them. What are the causes? Kidney stones may be caused by:  A condition in which certain glands produce too much parathyroid hormone (primary hyperparathyroidism), which causes too much calcium buildup in the blood.  Buildup of uric acid crystals in the bladder (hyperuricosuria). Uric acid is a chemical that the body produces when you eat certain foods. It usually exits the body in the urine.  Narrowing (stricture) of one or both of the tubes that drain urine from the kidneys to the bladder (ureters).  A kidney blockage that is present at birth (congenital obstruction).  Past surgery on the kidney or the ureters, such as gastric bypass surgery.  What increases the risk? The following factors make you more likely to develop kidney stones:  Having had a kidney stone in the past.  Having a family history of kidney stones.  Not drinking enough water.  Eating a diet that is high in protein, salt (sodium), or sugar.  Being overweight or  obese.  What are the signs or symptoms? Symptoms of a kidney stone may include:  Nausea.  Vomiting.  Blood in the urine (hematuria).  Pain in the side of the abdomen, right below the ribs (flank pain). Pain usually spreads (radiates) to the groin.  Needing to urinate frequently or urgently.  How is this diagnosed? This condition may be diagnosed based on:  Your medical history.  A physical exam.  Blood tests.  Urine tests.  CT scan.  Abdominal X-ray.  A procedure to examine the inside of the bladder (cystoscopy).  How is this treated? Treatment for kidney stones depends on the size, location, and makeup of the stones. Treatment may involve:  Analyzing your urine before and after you pass the stone through urination.  Being monitored at the hospital until you pass the stone through urination.  Increasing your fluid intake and decreasing the amount of calcium and protein in your diet.  A procedure to break up kidney stones in the bladder using: ? A focused beam of light (laser therapy). ? Shock waves (extracorporeal shock wave lithotripsy).  Surgery to remove kidney stones. This may be needed if you have severe pain or have stones that block your urinary tract.  Follow these instructions at home: Eating and drinking   Drink enough fluid to keep your urine clear or pale yellow. This will help you to pass the kidney stone.  If directed, change your diet. This may include: ?  Limiting how much sodium you eat. ? Eating more fruits and vegetables. ? Limiting how much meat, poultry, fish, and eggs you eat.  Follow instructions from your health care provider about eating or drinking restrictions. General instructions  Collect urine samples as told by your health care provider. You may need to collect a urine sample: ? 24 hours after you pass the stone. ? 8-12 weeks after passing the kidney stone, and every 6-12 months after that.  Strain your urine every time  you urinate, for as long as directed. Use the strainer that your health care provider recommends.  Do not throw out the kidney stone after passing it. Keep the stone so it can be tested by your health care provider. Testing the makeup of your kidney stone may help prevent you from getting kidney stones in the future.  Take over-the-counter and prescription medicines only as told by your health care provider.  Keep all follow-up visits as told by your health care provider. This is important. You may need follow-up X-rays or ultrasounds to make sure that your stone has passed. How is this prevented? To prevent another kidney stone:  Drink enough fluid to keep your urine clear or pale yellow. This is the best way to prevent kidney stones.  Eat a healthy diet and follow recommendations from your health care provider about foods to avoid. You may be instructed to eat a low-protein diet. Recommendations vary depending on the type of kidney stone that you have.  Maintain a healthy weight.  Contact a health care provider if:  You have pain that gets worse or does not get better with medicine. Get help right away if:  You have a fever or chills.  You develop severe pain.  You develop new abdominal pain.  You faint.  You are unable to urinate. This information is not intended to replace advice given to you by your health care provider. Make sure you discuss any questions you have with your health care provider. Document Released: 10/03/2005 Document Revised: 04/22/2016 Document Reviewed: 03/18/2016 Elsevier Interactive Patient Education  2017 [place] DASH stands for "Dietary Approaches to Stop Hypertension." The DASH eating plan is a healthy eating plan that has been shown to reduce high blood pressure (hypertension). It may also reduce your risk for type 2 diabetes, heart disease, and stroke. The DASH eating plan may also help with weight loss. What are tips for  following this plan? General guidelines  Avoid eating more than 2,300 mg (milligrams) of salt (sodium) a day. If you have hypertension, you may need to reduce your sodium intake to 1,500 mg a day.  Limit alcohol intake to no more than 1 drink a day for nonpregnant women and 2 drinks a day for men. One drink equals 12 oz of beer, 5 oz of wine, or 1 oz of hard liquor.  Work with your health care provider to maintain a healthy body weight or to lose weight. Ask what an ideal weight is for you.  Get at least 30 minutes of exercise that causes your heart to beat faster (aerobic exercise) most days of the week. Activities may include walking, swimming, or biking.  Work with your health care provider or diet and nutrition specialist (dietitian) to adjust your eating plan to your individual calorie needs. Reading food labels  Check food labels for the amount of sodium per serving. Choose foods with less than 5 percent of the Daily Value of sodium. Generally, foods  with less than 300 mg of sodium per serving fit into this eating plan.  To find whole grains, look for the word "whole" as the first word in the ingredient list. Shopping  Buy products labeled as "low-sodium" or "no salt added."  Buy fresh foods. Avoid canned foods and premade or frozen meals. Cooking  Avoid adding salt when cooking. Use salt-free seasonings or herbs instead of table salt or sea salt. Check with your health care provider or pharmacist before using salt substitutes.  Do not fry foods. Cook foods using healthy methods such as baking, boiling, grilling, and broiling instead.  Cook with heart-healthy oils, such as olive, canola, soybean, or sunflower oil. Meal planning   Eat a balanced diet that includes: ? 5 or more servings of fruits and vegetables each day. At each meal, try to fill half of your plate with fruits and vegetables. ? Up to 6-8 servings of whole grains each day. ? Less than 6 oz of lean meat,  poultry, or fish each day. A 3-oz serving of meat is about the same size as a deck of cards. One egg equals 1 oz. ? 2 servings of low-fat dairy each day. ? A serving of nuts, seeds, or beans 5 times each week. ? Heart-healthy fats. Healthy fats called Omega-3 fatty acids are found in foods such as flaxseeds and coldwater fish, like sardines, salmon, and mackerel.  Limit how much you eat of the following: ? Canned or prepackaged foods. ? Food that is high in trans fat, such as fried foods. ? Food that is high in saturated fat, such as fatty meat. ? Sweets, desserts, sugary drinks, and other foods with added sugar. ? Full-fat dairy products.  Do not salt foods before eating.  Try to eat at least 2 vegetarian meals each week.  Eat more home-cooked food and less restaurant, buffet, and fast food.  When eating at a restaurant, ask that your food be prepared with less salt or no salt, if possible. What foods are recommended? The items listed may not be a complete list. Talk with your dietitian about what dietary choices are best for you. Grains Whole-grain or whole-wheat bread. Whole-grain or whole-wheat pasta. Brown rice. [place]. Bulgur. Whole-grain and low-sodium cereals. Pita bread. Low-fat, low-sodium crackers. Whole-wheat flour tortillas. Vegetables Fresh or frozen vegetables (raw, steamed, roasted, or grilled). Low-sodium or reduced-sodium tomato and vegetable juice. Low-sodium or reduced-sodium tomato sauce and tomato paste. Low-sodium or reduced-sodium canned vegetables. Fruits All fresh, dried, or frozen fruit. Canned fruit in natural juice (without added sugar). Meat and other protein foods Skinless chicken or [place]. Ground chicken or [place]. Pork with fat trimmed off. Fish and seafood. Egg whites. Dried beans, peas, or lentils. Unsalted nuts, nut butters, and seeds. Unsalted canned beans. Lean cuts of beef with fat trimmed off. Low-sodium, lean deli meat. Dairy Low-fat  (1%) or fat-free (skim) milk. Fat-free, low-fat, or reduced-fat cheeses. Nonfat, low-sodium ricotta or cottage cheese. Low-fat or nonfat yogurt. Low-fat, low-sodium cheese. Fats and oils Soft margarine without trans fats. Vegetable oil. Low-fat, reduced-fat, or light mayonnaise and salad dressings (reduced-sodium). Canola, safflower, olive, soybean, and sunflower oils. Avocado. Seasoning and other foods Herbs. Spices. Seasoning mixes without salt. Unsalted popcorn and pretzels. Fat-free sweets. What foods are not recommended? The items listed may not be a complete list. Talk with your dietitian about what dietary choices are best for you. Grains Baked goods made with fat, such as croissants, muffins, or some breads. Dry pasta or rice  meal packs. Vegetables Creamed or fried vegetables. Vegetables in a cheese sauce. Regular canned vegetables (not low-sodium or reduced-sodium). Regular canned tomato sauce and paste (not low-sodium or reduced-sodium). Regular tomato and vegetable juice (not low-sodium or reduced-sodium). Angie Fava. Olives. Fruits Canned fruit in a light or heavy syrup. Fried fruit. Fruit in cream or butter sauce. Meat and other protein foods Fatty cuts of meat. Ribs. Fried meat. Berniece Salines. Sausage. Bologna and other processed lunch meats. Salami. Fatback. Hotdogs. Bratwurst. Salted nuts and seeds. Canned beans with added salt. Canned or smoked fish. Whole eggs or egg yolks. Chicken or [place] with skin. Dairy Whole or 2% milk, cream, and half-and-half. Whole or full-fat cream cheese. Whole-fat or sweetened yogurt. Full-fat cheese. Nondairy creamers. Whipped toppings. Processed cheese and cheese spreads. Fats and oils Butter. Stick margarine. Lard. Shortening. Ghee. Bacon fat. Tropical oils, such as coconut, palm kernel, or palm oil. Seasoning and other foods Salted popcorn and pretzels. Onion salt, garlic salt, seasoned salt, table salt, and sea salt. Worcestershire sauce. Tartar sauce.  Barbecue sauce. Teriyaki sauce. Soy sauce, including reduced-sodium. Steak sauce. Canned and packaged gravies. Fish sauce. Oyster sauce. Cocktail sauce. Horseradish that you find on the shelf. Ketchup. Mustard. Meat flavorings and tenderizers. Bouillon cubes. Hot sauce and Tabasco sauce. Premade or packaged marinades. Premade or packaged taco seasonings. Relishes. Regular salad dressings. Where to find more information:  National Heart, Lung, and [place]: https://wilson-eaton.com/  American Heart Association: www.heart.org Summary  The DASH eating plan is a healthy eating plan that has been shown to reduce high blood pressure (hypertension). It may also reduce your risk for type 2 diabetes, heart disease, and stroke.  With the DASH eating plan, you should limit salt (sodium) intake to 2,300 mg a day. If you have hypertension, you may need to reduce your sodium intake to 1,500 mg a day.  When on the DASH eating plan, aim to eat more fresh fruits and vegetables, whole grains, lean proteins, low-fat dairy, and heart-healthy fats.  Work with your health care provider or diet and nutrition specialist (dietitian) to adjust your eating plan to your individual calorie needs. This information is not intended to replace advice given to you by your health care provider. Make sure you discuss any questions you have with your health care provider. Document Released: 09/22/2011 Document Revised: 09/26/2016 Document Reviewed: 09/26/2016 Elsevier Interactive Patient Education  2017 [employer].  IF you received an x-ray today, you will receive an invoice from [facility]. Please contact [facility] at [phone] with questions or concerns regarding your invoice.   IF you received labwork today, you will receive an invoice from [facility]. Please contact [facility] at [phone] with questions or concerns regarding your invoice.   Our billing staff will not be able to assist you with  questions regarding bills from these companies.  You will be contacted with the lab results as soon as they are available. The fastest way to get your results is to activate your My Chart account. Instructions are located on the last page of this paperwork. If you have not heard from [place] regarding the results in 2 weeks, please contact this office.

## 2017-09-03 LAB — URINE CULTURE

## 2017-09-04 ENCOUNTER — Other Ambulatory Visit: Payer: Self-pay | Admitting: Physician Assistant

## 2017-09-04 DIAGNOSIS — N3001 Acute cystitis with hematuria: Secondary | ICD-10-CM

## 2017-09-04 MED ORDER — AMOXICILLIN-POT CLAVULANATE 500-125 MG PO TABS: 1.0000 | ORAL_TABLET | Freq: Two times a day (BID) | ORAL | 0 refills | Status: AC

## 2017-09-04 NOTE — Progress Notes (Signed)
Urine culture positive. Left VM on her phone, also released to my chart. Plan to start Augmentin.

## 2017-10-13 ENCOUNTER — Other Ambulatory Visit: Payer: Self-pay

## 2017-10-13 ENCOUNTER — Ambulatory Visit: Payer: BLUE CROSS/BLUE SHIELD | Admitting: Physician Assistant

## 2017-10-13 ENCOUNTER — Encounter: Payer: Self-pay | Admitting: Physician Assistant

## 2017-10-13 VITALS — BP 122/82 | HR 97 | Temp 98.9°F | Resp 16 | Ht 61.0 in | Wt 127.2 lb

## 2017-10-13 DIAGNOSIS — Z889 Allergy status to unspecified drugs, medicaments and biological substances status: Secondary | ICD-10-CM

## 2017-10-13 DIAGNOSIS — J3489 Other specified disorders of nose and nasal sinuses: Secondary | ICD-10-CM

## 2017-10-13 DIAGNOSIS — J209 Acute bronchitis, unspecified: Secondary | ICD-10-CM

## 2017-10-13 DIAGNOSIS — R05 Cough: Secondary | ICD-10-CM

## 2017-10-13 DIAGNOSIS — G479 Sleep disorder, unspecified: Secondary | ICD-10-CM

## 2017-10-13 DIAGNOSIS — R059 Cough, unspecified: Secondary | ICD-10-CM

## 2017-10-13 MED ORDER — HYDROCODONE-HOMATROPINE 5-1.5 MG/5ML PO SYRP: 5.0000 mL | ORAL_SOLUTION | Freq: Three times a day (TID) | ORAL | 0 refills | Status: DC | PRN

## 2017-10-13 MED ORDER — AZITHROMYCIN 250 MG PO TABS: ORAL_TABLET | ORAL | 0 refills | Status: DC

## 2017-10-13 MED ORDER — BENZONATATE 100 MG PO CAPS: 100.0000 mg | ORAL_CAPSULE | Freq: Three times a day (TID) | ORAL | 0 refills | Status: DC | PRN

## 2017-10-13 MED ORDER — IPRATROPIUM BROMIDE 0.03 % NA SOLN: 2.0000 | Freq: Two times a day (BID) | NASAL | 0 refills | Status: DC

## 2017-10-13 MED ORDER — FLUTICASONE PROPIONATE 50 MCG/ACT NA SUSP: 1.0000 | Freq: Every day | NASAL | 1 refills | Status: DC

## 2017-10-13 MED ORDER — TRAZODONE HCL 50 MG PO TABS: 25.0000 mg | ORAL_TABLET | Freq: Every evening | ORAL | 3 refills | Status: DC | PRN

## 2017-10-13 NOTE — Patient Instructions (Addendum)
- We will treat you both symptomatically and with an antibiotic. - I recommend you rest, drink plenty of fluids, eat light meals including soups.  - You may use cough syrup at night for your cough and sore throat, Tessalon pearls during the day. Be aware that cough syrup can definitely make you drowsy and sleepy so do not drive or operate any heavy machinery if it is affecting you during the day.   - Please let me know if you are not seeing any improvement or get worse in 7-10 days.    Acute Bronchitis, Adult Acute bronchitis is when air tubes (bronchi) in the lungs suddenly get swollen. The condition can make it hard to breathe. It can also cause these symptoms:  A cough.  Coughing up clear, yellow, or green mucus.  Wheezing.  Chest congestion.  Shortness of breath.  A fever.  Body aches.  Chills.  A sore throat.  Follow these instructions at home: Medicines  Take over-the-counter and prescription medicines only as told by your doctor.  If you were prescribed an antibiotic medicine, take it as told by your doctor. Do not stop taking the antibiotic even if you start to feel better. General instructions  Rest.  Drink enough fluids to keep your pee (urine) clear or pale yellow.  Avoid smoking and secondhand smoke. If you smoke and you need help quitting, ask your doctor. Quitting will help your lungs heal faster.  Use an inhaler, cool mist vaporizer, or humidifier as told by your doctor.  Keep all follow-up visits as told by your doctor. This is important. How is this prevented? To lower your risk of getting this condition again:  Wash your hands often with soap and water. If you cannot use soap and water, use hand sanitizer.  Avoid contact with people who have cold symptoms.  Try not to touch your hands to your mouth, nose, or eyes.  Make sure to get the flu shot every year.  Contact a doctor if:  Your symptoms do not get better in 2 weeks. Get help right  away if:  You cough up blood.  You have chest pain.  You have very bad shortness of breath.  You become dehydrated.  You faint (pass out) or keep feeling like you are going to pass out.  You keep throwing up (vomiting).  You have a very bad headache.  Your fever or chills gets worse. This information is not intended to replace advice given to you by your health care provider. Make sure you discuss any questions you have with your health care provider. Document Released: 03/21/2008 Document Revised: 05/11/2016 Document Reviewed: 03/23/2016 Elsevier Interactive Patient Education  2018 [employer]    IF you received an x-ray today, you will receive an invoice from [facility]. Please contact [facility] at [phone] with questions or concerns regarding your invoice.   IF you received labwork today, you will receive an invoice from [facility]. Please contact [facility] at [phone] with questions or concerns regarding your invoice.   Our billing staff will not be able to assist you with questions regarding bills from these companies.  You will be contacted with the lab results as soon as they are available. The fastest way to get your results is to activate your My Chart account. Instructions are located on the last page of this paperwork. If you have not heard from us regarding the results in 2 weeks, please contact this office.    We recommend that you  schedule a mammogram for breast cancer screening. Typically, you do not need a referral to do this. Please contact a local imaging center to schedule your mammogram.  [facility] - [phone]  *ask for the Radiology Department [facility]) - [phone](336) [phone] or [phone]) [phone]  [facility] - [phone] [facility] - [phone]) [phone] [facility] - [phone]) [phone]  *ask for the Radiology Department [facility] - [phone]) [phone]   *ask for the Radiology Department [facility] - [phone]) [phone]  *ask for the Mammography Department [facility] - [phone](336) [phone]

## 2017-10-13 NOTE — Progress Notes (Signed)
MRN: [id number] DOB: 1976-08-02  Subjective:   Alexandra Dorsey is a 41 y.o. female presenting for chief complaint of Cough (x 4 weeks, has taken tylenol, nyquil and other otc medications without relief, no sore throat but outside of neck hurts, coughing up thick yellow/green mucus, some diarrhea and cough is like a smoker's cough its nonstop and unable to rest due to coughing) .  Reports 4 week history of productive cough with green phlegm production. Has had some fatigue, fever and chills. Did have sore throat and runny nose at first but that has since resolved. Denies hemoptysis, sinus pain, ear pain, wheezing, shortness of breath, chest pain and myalgia, nausea, vomiting, abdominal pain and diarrhea. Has tried everything over the counter like dayquil, nyquil, mucinex with no full relief. Has had  sick contact with people at work. Has mild history of seasonal allergies, no history of asthma or COPD. Patient has not had flu shot this season. Former smoker. Quit years ago. Denies recent travel.  Patient also needs refill for Flonase and trazodone.  Takes Flonase for allergies.  She takes trazodone for sleep.  She tolerates the medication well.  Reports no side effects.  Notes she gets adequate sleep with trazodone.  Has no other questions or complaints.   Alexandra Dorsey has a current medication list which includes the following prescription(s): alprazolam, atenolol, fluticasone, ranitidine, tamsulosin, tramadol-acetaminophen, trazodone, azithromycin, benzonatate, hydrocodone-homatropine, ipratropium, and ondansetron. Also is allergic to lisinopril and norvasc [amlodipine besylate].  Alexandra Dorsey  has a past medical history of Anxiety and Hypertension. Also  has no past surgical history on file.   Objective:   Vitals: BP 122/82 (BP Location: Left Arm, Patient Position: Sitting, Cuff Size: Normal)   Pulse 97   Temp 98.9 F (37.2 C) (Oral)   Resp 16   Ht 5\' 1"  (1.549 m)   Wt 127 lb 3.2 oz (57.7 kg)    LMP 10/01/2017   SpO2 99%   BMI 24.03 kg/m   Physical Exam  Constitutional: She is oriented to person, place, and time. She appears well-developed and well-nourished. No distress.  HENT:  Head: Normocephalic and atraumatic.  Right Ear: External ear and ear canal normal. Tympanic membrane is not erythematous and not bulging. A middle ear effusion is present.  Left Ear: External ear and ear canal normal. Tympanic membrane is not erythematous and not bulging. A middle ear effusion is present.  Nose: Rhinorrhea present. Right sinus exhibits no maxillary sinus tenderness and no frontal sinus tenderness. Left sinus exhibits no maxillary sinus tenderness and no frontal sinus tenderness.  Mouth/Throat: Uvula is midline, oropharynx is clear and moist and mucous membranes are normal. No tonsillar exudate.  Eyes: Conjunctivae are normal.  Neck: Normal range of motion.  Cardiovascular: Normal rate, regular rhythm, normal heart sounds and intact distal pulses.  Pulmonary/Chest: Effort normal and breath sounds normal. She has no wheezes. She has no rhonchi. She has no rales.  Lymphadenopathy:       Head (right side): No submental, no submandibular, no tonsillar, no preauricular, no posterior auricular and no occipital adenopathy present.       Head (left side): No submental, no submandibular, no tonsillar, no preauricular, no posterior auricular and no occipital adenopathy present.    She has no cervical adenopathy.       Right: No supraclavicular adenopathy present.       Left: No supraclavicular adenopathy present.  Neurological: She is alert and oriented to person, place, and time.  Skin: Skin  is warm and dry.  Psychiatric: She has a normal mood and affect.  Vitals reviewed.   No results found for this or any previous visit (from the past 24 hour(s)).  Pulse Readings from Last 3 Encounters:  10/13/17 97  09/01/17 86  06/07/17 89    Assessment and Plan :  1. Cough 2. Acute bronchitis,  unspecified organism Due to duration of cough, recommended chest x-ray.  Patient refuses chest x-ray.  Lungs are CTAB.  Vitals stable.  Will treat for underlying bacterial etiology at this time.  Will also give symptomatic treatment.  Patient advised to return to clinic if symptoms worsen, do not improve, or as needed. - azithromycin (ZITHROMAX) 250 MG tablet; Take 2 tabs PO x 1 dose, then 1 tab PO QD x 4 days  Dispense: 6 tablet; Refill: 0 - benzonatate (TESSALON) 100 MG capsule; Take 1-2 capsules (100-200 mg total) by mouth 3 (three) times daily as needed for cough.  Dispense: 40 capsule; Refill: 0 - HYDROcodone-homatropine (HYCODAN) 5-1.5 MG/5ML syrup; Take 5 mLs by mouth every 8 (eight) hours as needed for cough.  Dispense: 120 mL; Refill: 0  3. Sleep disturbance Well controlled.  - traZODone (DESYREL) 50 MG tablet; Take 0.5-1 tablets (25-50 mg total) by mouth at bedtime as needed for sleep.  Dispense: 30 tablet; Refill: 3  4. H/O seasonal allergies - fluticasone (FLONASE) 50 MCG/ACT nasal spray; Place 1 spray into both nostrils daily.  Dispense: 16 g; Refill: 1  5. Rhinorrhea Given Rx for Atrovent nasal spray.  Educated her that she can try this for runny nose instead of Flonase.  If this does not work as well as Audiological scientistlonase does for her symptoms, she can return back to using Flonase. - ipratropium (ATROVENT) 0.03 % nasal spray; Place 2 sprays into both nostrils 2 (two) times daily.  Dispense: 30 mL; Refill: 0  Benjiman CoreBrittany Jeyden Coffelt, PA-C  [facility] at [facility] 10/13/2017 5:13 PM

## 2017-10-18 ENCOUNTER — Telehealth: Payer: Self-pay | Admitting: Family Medicine

## 2017-10-18 NOTE — Telephone Encounter (Signed)
Copied from CRM [id number]. Topic: Quick Communication - See Telephone Encounter >> Oct 18, 2017 12:36 PM Rudi CocoLathan, Ronak Duquette M, NT wrote: CRM for notification. See Telephone encounter for:   10/18/17. Pt calling to see if she can get a refill on med. Hydromet (syrup)  [facility] Ginette Otto- [place], [place] - [address] [place] [id number] Phone: [phone] Fax: [phone]

## 2017-10-19 NOTE — Telephone Encounter (Signed)
I gave her a Rx for hycodan on 12/28? Is she already out of it?

## 2017-10-19 NOTE — Telephone Encounter (Signed)
Pt following up on refill request

## 2017-10-19 NOTE — Telephone Encounter (Signed)
Do you want to refill? 

## 2017-10-20 NOTE — Telephone Encounter (Signed)
Lm for patient to call back.  Is she out of this medication?

## 2018-03-26 ENCOUNTER — Ambulatory Visit: Payer: Self-pay

## 2018-03-26 NOTE — Telephone Encounter (Addendum)
Patient called in with c/o "headache, high BP." She says "my BP has been elevated the past few months, but I have a headache now. My BP this weekend was 140/110 and 160/119. This morning it was 140/109. I took a sinus pill yesterday thinking it was a sinus headache. The headache is behind my left eye." I asked about other symptoms, she denies. According to protocol, see PCP within 3 days, patient prefers morning appointments due to having to be at work at 11 am. There is a Same Day slot available for Wednesday, 03/28/18 at 0940 and patient would like to be scheduled for that time. I advised I am not able to schedule at this time due to the restrictions of scheduling a Same Day visit. I advised I will have to send this over to the provider to give approval to schedule. Patient did not want to see another provider. Care advice given, patient verbalized understanding. Patient would like a call with the appointment, she says leave a [phone].   Reason for Disposition . Systolic BP  >= 160 OR Diastolic >= 100  Answer Assessment - Initial Assessment Questions 1. BLOOD PRESSURE: "What is the blood pressure?" "Did you take at least two measurements 5 minutes apart?"     140/109 2. ONSET: "When did you take your blood pressure?"     This morning before medication 3. HOW: "How did you obtain the blood pressure?" (e.g., visiting nurse, automatic home BP monitor)     Automatic home BP monitor 4. HISTORY: "Do you have a history of high blood pressure?"     Yes 5. MEDICATIONS: "Are you taking any medications for blood pressure?" "Have you missed any doses recently?"     Yes, not missed doses 6. OTHER SYMPTOMS: "Do you have any symptoms?" (e.g., headache, chest pain, blurred vision, difficulty breathing, weakness)     Headache behind left eye 7. PREGNANCY: "Is there any chance you are pregnant?" "When was your last menstrual period?"     No  Protocols used: HIGH BLOOD PRESSURE-A-AH

## 2018-03-29 ENCOUNTER — Other Ambulatory Visit: Payer: Self-pay

## 2018-03-29 ENCOUNTER — Ambulatory Visit (INDEPENDENT_AMBULATORY_CARE_PROVIDER_SITE_OTHER): Payer: Self-pay | Admitting: Family Medicine

## 2018-03-29 ENCOUNTER — Encounter: Payer: Self-pay | Admitting: Family Medicine

## 2018-03-29 VITALS — BP 131/87 | HR 86 | Temp 98.6°F | Resp 17 | Ht 61.0 in | Wt 124.4 lb

## 2018-03-29 DIAGNOSIS — R0989 Other specified symptoms and signs involving the circulatory and respiratory systems: Secondary | ICD-10-CM

## 2018-03-29 DIAGNOSIS — Z5181 Encounter for therapeutic drug level monitoring: Secondary | ICD-10-CM

## 2018-03-29 DIAGNOSIS — I10 Essential (primary) hypertension: Secondary | ICD-10-CM

## 2018-03-29 LAB — BASIC METABOLIC PANEL
BUN / CREAT RATIO: 19 (ref 9–23)
BUN: 11 mg/dL (ref 6–24)
CO2: 23 mmol/L (ref 20–29)
CREATININE: 0.57 mg/dL (ref 0.57–1.00)
Calcium: 9.5 mg/dL (ref 8.7–10.2)
Chloride: 104 mmol/L (ref 96–106)
GFR calc Af Amer: 134 mL/min/{1.73_m2} (ref >59)
GFR calc non Af Amer: 116 mL/min/{1.73_m2} (ref >59)
GLUCOSE: 103 mg/dL — AB (ref 65–99)
Potassium: 3.7 mmol/L (ref 3.5–5.2)
Sodium: 144 mmol/L (ref 134–144)

## 2018-03-29 MED ORDER — ATENOLOL 50 MG PO TABS: ORAL_TABLET | ORAL | 1 refills | Status: DC

## 2018-03-29 NOTE — Patient Instructions (Addendum)
Instructions and Plan      IF you received an x-ray today, you will receive an invoice from [facility]. Please contact [facility] at [phone] with questions or concerns regarding your invoice.   IF you received labwork today, you will receive an invoice from [facility]. Please contact [facility] at [phone] with questions or concerns regarding your invoice.   Our billing staff will not be able to assist you with questions regarding bills from these companies.  You will be contacted with the lab results as soon as they are available. The fastest way to get your results is to activate your My Chart account. Instructions are located on the last page of this paperwork. If you have not heard from us regarding the results in 2 weeks, please contact this office.    We recommend that you schedule a mammogram for breast cancer screening. Typically, you do not need a referral to do this. Please contact a local imaging center to schedule your mammogram.  [facility] - [phone]) [phone]  *ask for the Radiology Department [facility] - [phone] or [phone]  [facility] - [phone] [facility] - [phone]) [phone] [facility] - [phone](336) [phone]  *ask for the Radiology Department [facility] - [phone]) [phone]  *ask for the Radiology Department [facility] - [phone]) [phone]  *ask for the Mammography Department [facility] - [phone]) [phone]

## 2018-03-29 NOTE — Progress Notes (Signed)
Chief Complaint  Patient presents with  . Headache    x past couple months, pain behind left eye, ? sinus issue.  Pt c/o elevated bp's, over the weekend highest 167/119 and average bp last few days 140-160 systolic and 110 diastolic pt thinks bp med may need adjusting    HPI   Pt reports that she has had elevated bp that she can tell with headaches and blurry vision She reports that over the weekend she had elevated bp readings at home of 167/119 so she went to [facility] nearby pharmacy and had her bp checked and it was consistent with her home monitor She reports that her averages have been sbp 140-160 and dbp 110. She denies weakness, nausea, vomiting, chest pains or palpitations during this time She states that it lasted [facility] few hours and resolved on its own  She is [facility] nonsmoker She has no family history of stroke in her  BP Readings from Last 3 Encounters:  03/29/18 131/87  10/13/17 122/82  09/01/17 (!) 150/100    Blood Pressure 03/29/2018 10/13/2017 09/01/2017 06/07/2017 05/29/2017  BP 131/87 122/82 150/100 125/92 112/60  Some recent data might be hidden    Past Medical History:  Diagnosis Date  . Anxiety   . Hypertension     Current Outpatient Medications  Medication Sig Dispense Refill  . ALPRAZolam (XANAX) 0.5 MG tablet Take 1 tablet (0.5 mg total) at bedtime as needed by mouth for anxiety. 20 tablet 0  . atenolol (TENORMIN) 50 MG tablet take 1 tablet daily 90 tablet 1  . fluticasone (FLONASE) 50 MCG/ACT nasal spray Place 1 spray into both nostrils daily. 16 g 1  . ipratropium (ATROVENT) 0.03 % nasal spray Place 2 sprays into both nostrils 2 (two) times daily. 30 mL 0  . ranitidine (ZANTAC) 150 MG tablet Take 1 tablet (150 mg total) by mouth 2 (two) times daily. 60 tablet 2  . traZODone (DESYREL) 50 MG tablet Take 0.5-1 tablets (25-50 mg total) by mouth at bedtime as needed for sleep. 30 tablet 3  . azithromycin (ZITHROMAX) 250 MG tablet Take 2 tabs PO x 1 dose, then 1 tab PO QD x  4 days (Patient not taking: Reported on 03/29/2018) 6 tablet 0  . benzonatate (TESSALON) 100 MG capsule Take 1-2 capsules (100-200 mg total) by mouth 3 (three) times daily as needed for cough. (Patient not taking: Reported on 03/29/2018) 40 capsule 0  . HYDROcodone-homatropine (HYCODAN) 5-1.5 MG/5ML syrup Take 5 mLs by mouth every 8 (eight) hours as needed for cough. (Patient not taking: Reported on 03/29/2018) 120 mL 0  . ondansetron (ZOFRAN) 4 MG tablet Take 1 tablet (4 mg total) by mouth every 8 (eight) hours as needed for nausea or vomiting. (Patient not taking: Reported on 10/13/2017) 12 tablet 0  . tamsulosin (FLOMAX) 0.4 MG CAPS capsule Take 1 capsule (0.4 mg total) daily by mouth. Take 30 min after same meal daily. (Patient not taking: Reported on 03/29/2018) 30 capsule 0  . traMADol-acetaminophen (ULTRACET) 37.5-325 MG tablet Take 1 tablet every 6 (six) hours as needed by mouth. (Patient not taking: Reported on 03/29/2018) 20 tablet 0   No current facility-administered medications for this visit.     Allergies:  Allergies  Allergen Reactions  . Lisinopril Cough  . Norvasc [Amlodipine Besylate] Other (See Comments)    Feet hurts    History reviewed. No pertinent surgical history.  Social History   Socioeconomic History  . Marital status: Married    Spouse  name: Not on file  . Number of children: Not on file  . Years of education: Not on file  . Highest education level: Not on file  Occupational History  . Not on file  Social Needs  . Financial resource strain: Not on file  . Food insecurity:    Worry: Not on file    Inability: Not on file  . Transportation needs:    Medical: Not on file    Non-medical: Not on file  Tobacco Use  . Smoking status: Former Games developermoker  . Smokeless tobacco: Never Used  Substance and Sexual Activity  . Alcohol use: Yes    Alcohol/week: 6.0 oz    Types: 10 Cans of beer per week    Comment: daily  . Drug use: No  . Sexual activity: Not Currently    Lifestyle  . Physical activity:    Days per week: Not on file    Minutes per session: Not on file  . Stress: Not on file  Relationships  . Social connections:    Talks on phone: Not on file    Gets together: Not on file    Attends religious service: Not on file    Active member of club or organization: Not on file    Attends meetings of clubs or organizations: Not on file    Relationship status: Not on file  Other Topics Concern  . Not on file  Social History Narrative  . Not on file    Family History  Problem Relation Age of Onset  . Hypertension Mother   . Hypertension Father   . Stroke Maternal Grandfather      ROS Review of Systems See HPI Constitution: No fevers or chills No malaise No diaphoresis Skin: No rash or itching Eyes: no blurry vision, no double vision GU: no dysuria or hematuria Neuro: no dizziness or headaches * all others reviewed and negative   Objective: Vitals:   03/29/18 0948  BP: 131/87  Pulse: 86  Resp: 17  Temp: 98.6 F (37 C)  TempSrc: Oral  SpO2: 98%  Weight: 124 lb 6.4 oz (56.4 kg)  Height: 5\' 1"  (1.549 m)    Physical Exam  Constitutional: She is oriented to person, place, and time. She appears well-developed and well-nourished.  HENT:  Head: Normocephalic and atraumatic.  Eyes: Pupils are equal, round, and reactive to light. EOM are normal. Right pupil is round and reactive. Left pupil is round and reactive. Pupils are equal.  Fundoscopic exam:      The right eye shows no arteriolar narrowing, no AV nicking, no exudate, no hemorrhage and no papilledema. The right eye shows no venous pulsations.       The left eye shows no arteriolar narrowing, no AV nicking, no exudate, no hemorrhage and no papilledema. The left eye shows no venous pulsations.  Cardiovascular: Normal rate and regular rhythm.  No murmur heard. Pulmonary/Chest: Effort normal and breath sounds normal. No respiratory distress.  Neurological: She is alert and  oriented to person, place, and time.    Assessment and Plan Alexandra Dorsey was seen today for headache.  Diagnoses and all orders for this visit:  Labile blood pressure -     Basic metabolic panel  Essential hypertension -     Basic metabolic panel  Encounter for medication monitoring -     Basic metabolic panel  Labile bp that may require prn meds  She was instructed to do the following:  1. For Blood Pressures that  are higher than baseline with symptoms repeat Blood pressure check after drinking cold water and laying down.  If blood pressure is still greater than 150/100 please take atenolol 25mg  (which is [place] 1/2 tablet) Continue to take your usual daily medications.   2. Increase your exercise levels by walking and stretching daily  3. Please get an eye exam at [facility]  4. Return to clinic if blood pressures are not improving  Alexandra Dorsey Creta Levin

## 2019-04-14 ENCOUNTER — Other Ambulatory Visit: Payer: Self-pay | Admitting: Physician Assistant

## 2019-04-14 ENCOUNTER — Other Ambulatory Visit: Payer: Self-pay | Admitting: Family Medicine

## 2019-04-14 DIAGNOSIS — I10 Essential (primary) hypertension: Secondary | ICD-10-CM

## 2019-04-14 DIAGNOSIS — G479 Sleep disorder, unspecified: Secondary | ICD-10-CM

## 2019-04-14 NOTE — Telephone Encounter (Signed)
Requested medication (s) are due for refill today: yes  Requested medication (s) are on the active medication list: yes  Last refill:  10/13/17  Future visit scheduled: no  Notes to clinic:  Last OV > 3 months overdue sending to office for advice   Requested Prescriptions  Pending Prescriptions Disp Refills   traZODone (DESYREL) 50 MG tablet [Pharmacy Med Name: TRAZODONE 50MG  TABLETS] 30 tablet 3    Sig: TAKE 1/2 TO 1 TABLET BY MOUTH AT BEDTIME IF NEEDED FOR SLEEP     Psychiatry: Antidepressants - Serotonin Modulator Failed - 04/14/2019 11:14 AM      Failed - Valid encounter within last 6 months    Recent Outpatient Visits          1 year ago Labile blood pressure   [facility] at [facility], Arlie Solomons, MD   1 year ago Cough   Primary Care at [facility], [place] D, PA-C   1 year ago Flank pain   Primary Care at [facility], Gelene Mink, PA-C   1 year ago Acute pharyngitis, unspecified etiology   [facility] at [facility], Ines Bloomer, MD   2 years ago Essential hypertension   [facility] Care at [facility], [place], MD             Failed - Completed PHQ-2 or PHQ-9 in the last 360 days.

## 2019-04-14 NOTE — Telephone Encounter (Signed)
Requested medication (s) are due for refill today: yes  Requested medication (s) are on the active medication list: yes  Last refill:  03/29/18  Future visit scheduled: no  Notes to clinic:  Last OV >  3 months ago forwarding to office for advice   Requested Prescriptions  Pending Prescriptions Disp Refills   atenolol (TENORMIN) 50 MG tablet [Pharmacy Med Name: ATENOLOL 50MG  TABLETS] 90 tablet 1    Sig: TAKE 1 TABLET BY MOUTH DAILY     Cardiovascular:  Beta Blockers Failed - 04/14/2019 11:14 AM      Failed - Valid encounter within last 6 months    Recent Outpatient Visits          1 year ago Labile blood pressure   [facility] at [place], MD   1 year ago Cough   Primary Care at [facility], [place] D, PA-C   1 year ago Flank pain   Primary Care at [facility], Gelene Mink, PA-C   1 year ago Acute pharyngitis, unspecified etiology   Primary Care at [facility], Ines Bloomer, MD   2 years ago Essential hypertension   Primary Care at [facility], [place], MD             [place] BP in normal range    BP Readings from Last 1 Encounters:  03/29/18 131/87         Passed - Last Heart Rate in normal range    Pulse Readings from Last 1 Encounters:  03/29/18 86

## 2021-04-22 ENCOUNTER — Encounter (HOSPITAL_COMMUNITY): Payer: Self-pay

## 2021-04-22 ENCOUNTER — Emergency Department (HOSPITAL_COMMUNITY): Payer: 59

## 2021-04-22 ENCOUNTER — Other Ambulatory Visit: Payer: Self-pay

## 2021-04-22 ENCOUNTER — Emergency Department (HOSPITAL_COMMUNITY)
Admission: EM | Admit: 2021-04-22 | Discharge: 2021-04-22 | Disposition: A | Discharge status: Home / Self Care | Payer: 59 | Source: Home / Self Care | Attending: Emergency Medicine | Admitting: Emergency Medicine

## 2021-04-22 DIAGNOSIS — R059 Cough, unspecified: Secondary | ICD-10-CM

## 2021-04-22 DIAGNOSIS — Z79899 Other long term (current) drug therapy: Secondary | ICD-10-CM | POA: Insufficient documentation

## 2021-04-22 DIAGNOSIS — E860 Dehydration: Secondary | ICD-10-CM

## 2021-04-22 DIAGNOSIS — B349 Viral infection, unspecified: Secondary | ICD-10-CM

## 2021-04-22 DIAGNOSIS — U099 Post covid-19 condition, unspecified: Secondary | ICD-10-CM | POA: Insufficient documentation

## 2021-04-22 DIAGNOSIS — R197 Diarrhea, unspecified: Secondary | ICD-10-CM | POA: Insufficient documentation

## 2021-04-22 DIAGNOSIS — R519 Headache, unspecified: Secondary | ICD-10-CM | POA: Insufficient documentation

## 2021-04-22 DIAGNOSIS — R404 Transient alteration of awareness: Secondary | ICD-10-CM | POA: Diagnosis not present

## 2021-04-22 DIAGNOSIS — Z87891 Personal history of nicotine dependence: Secondary | ICD-10-CM | POA: Insufficient documentation

## 2021-04-22 DIAGNOSIS — M791 Myalgia, unspecified site: Secondary | ICD-10-CM | POA: Insufficient documentation

## 2021-04-22 DIAGNOSIS — E876 Hypokalemia: Secondary | ICD-10-CM

## 2021-04-22 DIAGNOSIS — R0602 Shortness of breath: Secondary | ICD-10-CM | POA: Insufficient documentation

## 2021-04-22 DIAGNOSIS — I10 Essential (primary) hypertension: Secondary | ICD-10-CM | POA: Insufficient documentation

## 2021-04-22 DIAGNOSIS — R112 Nausea with vomiting, unspecified: Secondary | ICD-10-CM | POA: Insufficient documentation

## 2021-04-22 DIAGNOSIS — K529 Noninfective gastroenteritis and colitis, unspecified: Secondary | ICD-10-CM | POA: Diagnosis not present

## 2021-04-22 DIAGNOSIS — Z20822 Contact with and (suspected) exposure to covid-19: Secondary | ICD-10-CM | POA: Insufficient documentation

## 2021-04-22 LAB — CBC WITH DIFFERENTIAL/PLATELET
Abs Immature Granulocytes: 0.01 10*3/uL (ref 0.00–0.07)
Basophils Absolute: 0 10*3/uL (ref 0.0–0.1)
Basophils Relative: 1 %
Eosinophils Absolute: 0 10*3/uL (ref 0.0–0.5)
Eosinophils Relative: 1 %
HCT: 43.7 % (ref 36.0–46.0)
Hemoglobin: 16.2 g/dL — ABNORMAL HIGH (ref 12.0–15.0)
Immature Granulocytes: 0 %
Lymphocytes Relative: 38 %
Lymphs Abs: 1.4 10*3/uL (ref 0.7–4.0)
MCH: 37.4 pg — ABNORMAL HIGH (ref 26.0–34.0)
MCHC: 37.1 g/dL — ABNORMAL HIGH (ref 30.0–36.0)
MCV: 100.9 fL — ABNORMAL HIGH (ref 80.0–100.0)
Monocytes Absolute: 0.4 10*3/uL (ref 0.1–1.0)
Monocytes Relative: 10 %
Neutro Abs: 1.8 10*3/uL (ref 1.7–7.7)
Neutrophils Relative %: 50 %
Platelets: 156 10*3/uL (ref 150–400)
RBC: 4.33 MIL/uL (ref 3.87–5.11)
RDW: 12.5 % (ref 11.5–15.5)
WBC: 3.7 10*3/uL — ABNORMAL LOW (ref 4.0–10.5)
nRBC: 0 % (ref 0.0–0.2)

## 2021-04-22 LAB — MAGNESIUM: Magnesium: 1.7 mg/dL (ref 1.7–2.4)

## 2021-04-22 LAB — BASIC METABOLIC PANEL
Anion gap: 16 — ABNORMAL HIGH (ref 5–15)
BUN: 7 mg/dL (ref 6–20)
CO2: 30 mmol/L (ref 22–32)
Calcium: 8.8 mg/dL — ABNORMAL LOW (ref 8.9–10.3)
Chloride: 92 mmol/L — ABNORMAL LOW (ref 98–111)
Creatinine, Ser: 0.58 mg/dL (ref 0.44–1.00)
GFR, Estimated: >60 mL/min (ref >60)
Glucose, Bld: 120 mg/dL — ABNORMAL HIGH (ref 70–99)
Potassium: 2.8 mmol/L — ABNORMAL LOW (ref 3.5–5.1)
Sodium: 138 mmol/L (ref 135–145)

## 2021-04-22 LAB — URINALYSIS, ROUTINE W REFLEX MICROSCOPIC
Bilirubin Urine: NEGATIVE
Glucose, UA: NEGATIVE mg/dL
Ketones, ur: NEGATIVE mg/dL
Leukocytes,Ua: NEGATIVE
Nitrite: NEGATIVE
Protein, ur: NEGATIVE mg/dL
Specific Gravity, Urine: 1.008 (ref 1.005–1.030)
pH: 6 (ref 5.0–8.0)

## 2021-04-22 LAB — RESP PANEL BY RT-PCR (FLU A&B, COVID) ARPGX2
Influenza A by PCR: NEGATIVE
Influenza B by PCR: NEGATIVE
SARS Coronavirus 2 by RT PCR: NEGATIVE

## 2021-04-22 LAB — LACTIC ACID, PLASMA
Lactic Acid, Venous: 2.9 mmol/L (ref 0.5–1.9)
Lactic Acid, Venous: 2.2 mmol/L (ref 0.5–1.9)

## 2021-04-22 LAB — PREGNANCY, URINE: Preg Test, Ur: NEGATIVE

## 2021-04-22 MED ORDER — BENZONATATE 200 MG PO CAPS: 200.0000 mg | ORAL_CAPSULE | Freq: Three times a day (TID) | ORAL | 0 refills | Status: DC

## 2021-04-22 MED ORDER — ONDANSETRON 4 MG PO TBDP: ORAL_TABLET | ORAL | 0 refills | Status: DC

## 2021-04-22 MED ORDER — ATENOLOL 50 MG PO TABS
50.0000 mg | ORAL_TABLET | Freq: Once | ORAL | Status: DC
  Filled 2021-04-22: qty 1

## 2021-04-22 MED ORDER — POTASSIUM CHLORIDE 10 MEQ/100ML IV SOLN
10.0000 meq | INTRAVENOUS | Status: AC
  Administered 2021-04-22 (×2): 10 meq via INTRAVENOUS
  Filled 2021-04-22 (×2): qty 100

## 2021-04-22 MED ORDER — POTASSIUM CHLORIDE CRYS ER 20 MEQ PO TBCR
40.0000 meq | EXTENDED_RELEASE_TABLET | Freq: Once | ORAL | Status: AC
  Administered 2021-04-22: 40 meq via ORAL
  Filled 2021-04-22: qty 2

## 2021-04-22 MED ORDER — AMLODIPINE BESYLATE 5 MG PO TABS
5.0000 mg | ORAL_TABLET | Freq: Once | ORAL | Status: AC
  Administered 2021-04-22: 5 mg via ORAL
  Filled 2021-04-22: qty 1

## 2021-04-22 MED ORDER — SODIUM CHLORIDE 0.9 % IV BOLUS
1000.0000 mL | Freq: Once | INTRAVENOUS | Status: AC
  Administered 2021-04-22: 1000 mL via INTRAVENOUS

## 2021-04-22 NOTE — Discharge Instructions (Addendum)
I suspect your symptoms are due to a viral illness.  Continue to treat your symptoms supportively, please make sure you are drinking plenty of fluids, even if you have no appetite try in eat some small meals throughout the day.  Use Zofran as needed for nausea and vomiting.  Tessalon to help with cough.  Take ibuprofen 600 mg and Tylenol 1000 mg every 6 hours for fever, body aches, headache.  Viral syndrome 2 over the course in about 7-10 days.  If you feel your symptoms are worsening, you are having chest pain, abdominal pain, difficulty breathing, high fevers or any other new or concerning symptoms return for reevaluation, otherwise follow-up with your primary care provider.  Your potassium was low today, you were given potassium replacement but should have this rechecked with your PCP in about a week.

## 2021-04-22 NOTE — ED Provider Notes (Signed)
Emergency Medicine Provider Triage Evaluation Note  Alexandra Dorsey , a 45 y.o. female  was evaluated in triage.  Pt complains of sob.  Review of Systems  Positive: Cough, congestion, chills, myalgia, sob, n/v/d Negative: Dysuria, rash, fever  Physical Exam  BP (!) 187/120 (BP Location: Left Arm)   Pulse (!) 136   Temp 98.2 F (36.8 C) (Oral)   Resp 18   SpO2 98%  Gen:   Awake, tearful Resp:  Normal effort  MSK:   Moves extremities without difficulty  Other:  tachycardic  Medical Decision Making  Medically screening exam initiated at 1:31 PM.  Appropriate orders placed.  Alexandra Dorsey was informed that the remainder of the evaluation will be completed by another provider, this initial triage assessment does not replace that evaluation, and the importance of remaining in the ED until their evaluation is complete.  Patient here with complaints of cold symptoms for the past 5 days.  Negative at-home COVID test.  Boyfriend currently hospitalized for bacterial pneumonia.  Patient found to be tachycardic with heart rate in the 130s.  She endorsed nausea vomiting diarrhea and feels dehydrated.  She has not been vaccinated for COVID-19 and has COVID infection twice in the past.     Fayrene Helper, Cordelia Poche 04/22/21 1333    Messick, Noralyn Pick, MD 04/23/21 [phone]

## 2021-04-22 NOTE — ED Notes (Signed)
Pt O2 while ambulating dropped to 89%. Once back in bed, O2 back up to 94% room air. No labored breathing noted.

## 2021-04-22 NOTE — ED Provider Notes (Signed)
Care assumed from PA [facility] at shift change, please see her note for full details, but in brief Alexandra Dorsey is a 45 y.o. female who presents with 5 days of progressively worsening chills, cough, body aches, headache and diarrhea.  Boyfriend is in the hospital with bacterial pneumonia, reports negative COVID test at home, no other known sick contacts.  Denies chest pain but reports some shortness of breath and just feeling malaised.  No abdominal pain or urinary symptoms.  Initial evaluation significant for mild leukopenia, hypokalemia to 2.8, lactate elevated at 2.9, suspected to be related primarily to dehydration as patient has had very poor p.o. intake over the past 5 days.  Chest x-ray negative for pneumonia, urinalysis with bacteriuria but no other signs of infection, squamous cells present, suspect contamination  Plan: Follow-up repeat lactic acid after IV fluids and COVID test, anticipate discharge.  The rest of patient's infectious work-up has been reassuring  Physical Exam  BP (!) 145/127   Pulse 88   Temp 98.2 F (36.8 C) (Oral)   Resp 19   Ht 5' (1.524 m)   Wt 48.5 kg   LMP 04/15/2021 (Approximate)   SpO2 98%   BMI 20.90 kg/m   Physical Exam Vitals and nursing note reviewed.  Constitutional:      General: She is not in acute distress.    Appearance: Normal appearance. She is well-developed. She is not ill-appearing or diaphoretic.     Comments: Well appearing and in no distress  HENT:     Head: Normocephalic and atraumatic.  Eyes:     General:        Right eye: No discharge.        Left eye: No discharge.  Pulmonary:     Effort: Pulmonary effort is normal. No respiratory distress.     Breath sounds: Normal breath sounds.  Abdominal:     Palpations: Abdomen is soft.     Tenderness: There is no abdominal tenderness.  Skin:    General: Skin is warm and dry.  Neurological:     Mental Status: She is alert and oriented to person, place, and time.      Coordination: Coordination normal.  Psychiatric:        Mood and Affect: Mood normal.        Behavior: Behavior normal.    ED Course/Procedures   Labs Reviewed  BASIC METABOLIC PANEL - Abnormal; Notable for the following components:      Result Value   Potassium 2.8 (*)    Chloride 92 (*)    Glucose, Bld 120 (*)    Calcium 8.8 (*)    Anion gap 16 (*)    All other components within normal limits  LACTIC ACID, PLASMA - Abnormal; Notable for the following components:   Lactic Acid, Venous 2.9 (*)    All other components within normal limits  LACTIC ACID, PLASMA - Abnormal; Notable for the following components:   Lactic Acid, Venous 2.2 (*)    All other components within normal limits  URINALYSIS, ROUTINE W REFLEX MICROSCOPIC - Abnormal; Notable for the following components:   APPearance HAZY (*)    Hgb urine dipstick SMALL (*)    Bacteria, UA MANY (*)    All other components within normal limits  CBC WITH DIFFERENTIAL/PLATELET - Abnormal; Notable for the following components:   WBC 3.7 (*)    Hemoglobin 16.2 (*)    MCV 100.9 (*)    MCH 37.4 (*)  MCHC 37.1 (*)    All other components within normal limits  RESP PANEL BY RT-PCR (FLU A&B, COVID) ARPGX2  PREGNANCY, URINE  MAGNESIUM   DG Chest 2 View  Result Date: 04/22/2021 CLINICAL DATA:  Congestion, cough, headache and body aches. EXAM: CHEST - 2 VIEW COMPARISON:  Single-view of the chest 06/06/2017. FINDINGS: The lungs are clear. Heart size is normal. No pneumothorax or pleural fluid. No acute or focal bony abnormality. IMPRESSION: Negative chest. Electronically Signed   By: Drusilla Kanner M.D.   On: 04/22/2021 14:44      Procedures  MDM   On reevaluation patient is well-appearing, satting at 100% on room air with no increased work of breathing and clear lungs.  Her repeat lactic acid is improving at 2.2, she has completed her potassium replacement.  High clinical suspicion for viral syndrome, COVID and flu were  negative.  Discussed symptomatic treatment and return precautions with patient, will prescribe Zofran and Tessalon Perles.  Stressed the importance of increasing fluid intake.  We will have her follow-up with PCP for potassium recheck.  Patient expresses understanding and agreement.  Discharged home in good condition.      Dartha Lodge, PA-C 04/22/21 1737    Jacalyn Lefevre, MD 04/22/21 2043

## 2021-04-22 NOTE — ED Triage Notes (Addendum)
Patient c/o SOB, nasal congestion, headache, generalized body aches, x 4 4 days. Patient states her significant other is hospitalized with pneumonia. Patient states she had a negative Home Covid test 4 days ago.  Patient states she has BP meds at home, but does not take regularly. Patient states it has been days since she last took it.

## 2021-04-22 NOTE — ED Provider Notes (Signed)
[facility]-EMERGENCY DEPT Provider Note   CSN: [id number] Arrival date & time: 04/22/21  1321     History Chief Complaint  Patient presents with   Shortness of Breath   Headache   Generalized Body Aches   Nausea   Diarrhea   Nasal Congestion    Alexandra Dorsey is a 45 y.o. female with Pmhx HTN who presents to the ED today with complaint of gradual onset, constant, worsening, shortness of breath that began 5 days ago.  Patient also complains of productive cough, posttussive emesis, nausea, diarrhea, headache, body aches.  She reports that her significant other is currently hospitalized for "bacterial pneumonia."  Patient states she took an at home COVID test which was negative.  She has had COVID twice in the past, most recently sometime last year and states this feels completely different.  She has been taking over-the-counter medications without relief prompting ED visit today.  She does admit that she has not taken her blood pressure medications in several days due to feeling fatigued.  Patient has not taken her temperature to see if she has had a fever.  She denies any chest pain, abdominal pain, vomiting, urinary symptoms.  The history is provided by the patient and medical records.      Past Medical History:  Diagnosis Date   Anxiety    Hypertension     Patient Active Problem List   Diagnosis Date Noted   Altered mental status 06/06/2017   Alcohol dependence with withdrawal with complication (HCC)    Lactic acidosis    Chills 05/29/2017   Non-intractable vomiting with nausea 05/29/2017   Acute pharyngitis 05/29/2017   Sore throat 05/29/2017   Essential hypertension 11/13/2016   Panic attacks 11/13/2016    No past surgical history on file.   OB History   No obstetric history on file.     Family History  Problem Relation Age of Onset   Hypertension Mother    Hypertension Father    Stroke Maternal Grandfather     Social History    Tobacco Use   Smoking status: Former    Pack years: 0.00   Smokeless tobacco: Never  Vaping Use   Vaping Use: Never used  Substance Use Topics   Alcohol use: Yes    Alcohol/week: 10.0 standard drinks    Types: 10 Cans of beer per week    Comment: daily   Drug use: No    Home Medications Prior to Admission medications   Medication Sig Start Date End Date Taking? Authorizing Provider  ALPRAZolam Prudy Feeler) 0.5 MG tablet Take 1 tablet (0.5 mg total) at bedtime as needed by mouth for anxiety. 09/01/17   McVey, Madelaine Bhat, PA-C  atenolol (TENORMIN) 50 MG tablet take 1 tablet daily 03/29/18   Doristine Bosworth, MD  fluticasone (FLONASE) 50 MCG/ACT nasal spray Place 1 spray into both nostrils daily. 10/13/17   Benjiman Core D, PA-C  ipratropium (ATROVENT) 0.03 % nasal spray Place 2 sprays into both nostrils 2 (two) times daily. 10/13/17   Benjiman Core D, PA-C  ranitidine (ZANTAC) 150 MG tablet Take 1 tablet (150 mg total) by mouth 2 (two) times daily. 03/21/17   Doristine Bosworth, MD  tamsulosin (FLOMAX) 0.4 MG CAPS capsule Take 1 capsule (0.4 mg total) daily by mouth. Take 30 min after same meal daily. Patient not taking: Reported on 03/29/2018 09/01/17   McVey, Madelaine Bhat, PA-C  traMADol-acetaminophen (ULTRACET) 37.5-325 MG tablet Take 1 tablet every 6 (  six) hours as needed by mouth. Patient not taking: Reported on 03/29/2018 09/01/17   McVey, Madelaine Bhat, PA-C  traZODone (DESYREL) 50 MG tablet Take 0.5-1 tablets (25-50 mg total) by mouth at bedtime as needed for sleep. 10/13/17   Benjiman Core D, PA-C    Allergies    Lisinopril and Norvasc [amlodipine besylate]  Review of Systems   Review of Systems  Constitutional:  Positive for chills and fatigue. Negative for fever.  Respiratory:  Positive for cough and shortness of breath.   Cardiovascular:  Negative for chest pain.  Gastrointestinal:  Positive for diarrhea and nausea. Negative for abdominal pain and  vomiting.  Genitourinary:  Negative for decreased urine volume, dysuria and flank pain.  All other systems reviewed and are negative.  Physical Exam Updated Vital Signs BP (!) 169/112   Pulse 92   Temp 98.2 F (36.8 C) (Oral)   Resp 13   Ht 5' (1.524 m)   Wt 48.5 kg   LMP 04/15/2021 (Approximate)   SpO2 96%   BMI 20.90 kg/m   Physical Exam Vitals and nursing note reviewed.  Constitutional:      Appearance: She is not ill-appearing or diaphoretic.     Comments: Fatigued  HENT:     Head: Normocephalic and atraumatic.  Eyes:     Conjunctiva/sclera: Conjunctivae normal.  Cardiovascular:     Rate and Rhythm: Regular rhythm. Tachycardia present.  Pulmonary:     Effort: Pulmonary effort is normal.     Breath sounds: Decreased breath sounds present. No wheezing, rhonchi or rales.     Comments: Actively coughing in the room.  Able to speak in full sentences without difficulty.  Diminished breath sounds throughout.  Satting 97% on room air. Abdominal:     Palpations: Abdomen is soft.     Tenderness: There is no abdominal tenderness. There is no guarding or rebound.  Musculoskeletal:     Cervical back: Neck supple.  Skin:    General: Skin is warm and dry.  Neurological:     Mental Status: She is alert.    ED Results / Procedures / Treatments   Labs (all labs ordered are listed, but only abnormal results are displayed) Labs Reviewed  BASIC METABOLIC PANEL - Abnormal; Notable for the following components:      Result Value   Potassium 2.8 (*)    Chloride 92 (*)    Glucose, Bld 120 (*)    Calcium 8.8 (*)    Anion gap 16 (*)    All other components within normal limits  LACTIC ACID, PLASMA - Abnormal; Notable for the following components:   Lactic Acid, Venous 2.9 (*)    All other components within normal limits  URINALYSIS, ROUTINE W REFLEX MICROSCOPIC - Abnormal; Notable for the following components:   APPearance HAZY (*)    Hgb urine dipstick SMALL (*)    Bacteria,  UA MANY (*)    All other components within normal limits  CBC WITH DIFFERENTIAL/PLATELET - Abnormal; Notable for the following components:   WBC 3.7 (*)    Hemoglobin 16.2 (*)    MCV 100.9 (*)    MCH 37.4 (*)    MCHC 37.1 (*)    All other components within normal limits  RESP PANEL BY RT-PCR (FLU A&B, COVID) ARPGX2  PREGNANCY, URINE  LACTIC ACID, PLASMA  MAGNESIUM    EKG None  Radiology DG Chest 2 View  Result Date: 04/22/2021 CLINICAL DATA:  Congestion, cough, headache and body aches. EXAM:  CHEST - 2 VIEW COMPARISON:  Single-view of the chest 06/06/2017. FINDINGS: The lungs are clear. Heart size is normal. No pneumothorax or pleural fluid. No acute or focal bony abnormality. IMPRESSION: Negative chest. Electronically Signed   By: Drusilla Kanner M.D.   On: 04/22/2021 14:44    Procedures Procedures   Medications Ordered in ED Medications  potassium chloride 10 mEq in 100 mL IVPB (has no administration in time range)  potassium chloride SA (KLOR-CON) CR tablet 40 mEq (has no administration in time range)  sodium chloride 0.9 % bolus 1,000 mL (1,000 mLs Intravenous New Bag/Given 04/22/21 1409)  amLODipine (NORVASC) tablet 5 mg (5 mg Oral Given 04/22/21 1439)    ED Course  I have reviewed the triage vital signs and the nursing notes.  Pertinent labs & imaging results that were available during my care of the patient were reviewed by me and considered in my medical decision making (see chart for details).  Clinical Course as of 04/22/21 1527  Thu Apr 22, 2021  1447 WBC(!): 3.7 [MV]  1455 Potassium(!): 2.8 [MV]  1503 Lactic Acid, Venous(!!): 2.9 [MV]    Clinical Course User Index [MV] Tanda Rockers, PA-C   MDM Rules/Calculators/A&P                          45 year old female who presents to the ED today with complaint of cough, shortness of breath, body aches, headache, nausea, diarrhea for the past 5 days.  Recent sick contact with significant other who she states is in  the hospital currently for "bacterial pneumonia."  On arrival to the ED today patient is afebrile.  She has noted to be tachycardic on arrival in the 120s and 130s.  She does admit to diarrhea, no vomiting.  Has been trying to stay hydrated.  Her blood pressure is also elevated 187/120 however she has not taken her blood pressure medication in several days.  Per chart review she is on atenolol however she denies this, she has amlodipine 5 mg bottle in her bag.  Will provide at this time.  Will work-up for cough and shortness of breath with lab work including CBC, BMP, lactic acid, chest x-ray, COVID test.  Will provide fluids and blood pressure medication and reevaluate.  CBC with leukopenia 3.7; pending COVID test. Hgb stable.  BMP with potassium 2.8; will provide potassium supplementation at this time. Glucose 120. Chloride slightly decreased 92 likely s/2 dehydration. Gap of 16; likely s/2 dehydration. No concern for DKA at this time.  CXR clear  UPT negative U/A with many bacteria however 11-20 squamous epithelial cells; suspect contamination. No complaints of urinary symptoms.   Lactic acid elevated at 2.9; without leukocytosis or fever lower suspicion for elevation s/2 to sepsis.   At shift change case signed out to oncoming ED provider Jodi Geralds, PA-C who will dispo patient accordingly. Pending COVID, repeat lactic acid, and ambulation with pulse ox.   This note was prepared using Dragon voice recognition software and may include unintentional dictation errors due to the inherent limitations of voice recognition software.  Final Clinical Impression(s) / ED Diagnoses Final diagnoses:  None    Rx / DC Orders ED Discharge Orders     None        Tanda Rockers, PA-C 04/22/21 1527    Bethann Berkshire, MD 04/25/21 1644

## 2021-04-25 ENCOUNTER — Inpatient Hospital Stay (HOSPITAL_COMMUNITY): Payer: 59

## 2021-04-25 ENCOUNTER — Inpatient Hospital Stay (HOSPITAL_COMMUNITY)
Admission: EM | Admit: 2021-04-25 | Discharge: 2021-04-27 | DRG: 391 | Disposition: A | Discharge status: Home / Self Care | Payer: 59 | Attending: Internal Medicine | Admitting: Internal Medicine

## 2021-04-25 ENCOUNTER — Other Ambulatory Visit: Payer: Self-pay

## 2021-04-25 ENCOUNTER — Emergency Department (HOSPITAL_COMMUNITY): Payer: 59

## 2021-04-25 ENCOUNTER — Encounter (HOSPITAL_COMMUNITY): Payer: Self-pay | Admitting: *Deleted

## 2021-04-25 DIAGNOSIS — G9341 Metabolic encephalopathy: Secondary | ICD-10-CM | POA: Diagnosis present

## 2021-04-25 DIAGNOSIS — Z8249 Family history of ischemic heart disease and other diseases of the circulatory system: Secondary | ICD-10-CM | POA: Diagnosis not present

## 2021-04-25 DIAGNOSIS — B961 Klebsiella pneumoniae [K. pneumoniae] as the cause of diseases classified elsewhere: Secondary | ICD-10-CM | POA: Diagnosis present

## 2021-04-25 DIAGNOSIS — Z87891 Personal history of nicotine dependence: Secondary | ICD-10-CM

## 2021-04-25 DIAGNOSIS — R404 Transient alteration of awareness: Secondary | ICD-10-CM

## 2021-04-25 DIAGNOSIS — R197 Diarrhea, unspecified: Secondary | ICD-10-CM | POA: Diagnosis not present

## 2021-04-25 DIAGNOSIS — E876 Hypokalemia: Secondary | ICD-10-CM

## 2021-04-25 DIAGNOSIS — D696 Thrombocytopenia, unspecified: Secondary | ICD-10-CM | POA: Diagnosis present

## 2021-04-25 DIAGNOSIS — Z7289 Other problems related to lifestyle: Secondary | ICD-10-CM | POA: Diagnosis not present

## 2021-04-25 DIAGNOSIS — W57XXXA Bitten or stung by nonvenomous insect and other nonvenomous arthropods, initial encounter: Secondary | ICD-10-CM | POA: Diagnosis present

## 2021-04-25 DIAGNOSIS — F1020 Alcohol dependence, uncomplicated: Secondary | ICD-10-CM | POA: Diagnosis present

## 2021-04-25 DIAGNOSIS — S0006XA Insect bite (nonvenomous) of scalp, initial encounter: Secondary | ICD-10-CM

## 2021-04-25 DIAGNOSIS — R112 Nausea with vomiting, unspecified: Secondary | ICD-10-CM | POA: Diagnosis not present

## 2021-04-25 DIAGNOSIS — Y900 Blood alcohol level of less than 20 mg/100 ml: Secondary | ICD-10-CM | POA: Diagnosis present

## 2021-04-25 DIAGNOSIS — K529 Noninfective gastroenteritis and colitis, unspecified: Principal | ICD-10-CM | POA: Diagnosis present

## 2021-04-25 DIAGNOSIS — Z20822 Contact with and (suspected) exposure to covid-19: Secondary | ICD-10-CM | POA: Diagnosis present

## 2021-04-25 DIAGNOSIS — Z888 Allergy status to other drugs, medicaments and biological substances status: Secondary | ICD-10-CM

## 2021-04-25 DIAGNOSIS — I10 Essential (primary) hypertension: Secondary | ICD-10-CM

## 2021-04-25 DIAGNOSIS — Z79899 Other long term (current) drug therapy: Secondary | ICD-10-CM | POA: Diagnosis not present

## 2021-04-25 DIAGNOSIS — F10239 Alcohol dependence with withdrawal, unspecified: Secondary | ICD-10-CM | POA: Diagnosis not present

## 2021-04-25 DIAGNOSIS — E860 Dehydration: Secondary | ICD-10-CM

## 2021-04-25 DIAGNOSIS — F419 Anxiety disorder, unspecified: Secondary | ICD-10-CM | POA: Diagnosis present

## 2021-04-25 DIAGNOSIS — R7401 Elevation of levels of liver transaminase levels: Secondary | ICD-10-CM

## 2021-04-25 DIAGNOSIS — Z7982 Long term (current) use of aspirin: Secondary | ICD-10-CM

## 2021-04-25 DIAGNOSIS — N390 Urinary tract infection, site not specified: Secondary | ICD-10-CM

## 2021-04-25 DIAGNOSIS — Z823 Family history of stroke: Secondary | ICD-10-CM | POA: Diagnosis not present

## 2021-04-25 LAB — COMPREHENSIVE METABOLIC PANEL
ALT: 33 U/L (ref 0–44)
AST: 146 U/L — ABNORMAL HIGH (ref 15–41)
Albumin: 4.5 g/dL (ref 3.5–5.0)
Alkaline Phosphatase: 71 U/L (ref 38–126)
Anion gap: 14 (ref 5–15)
BUN: 8 mg/dL (ref 6–20)
CO2: 31 mmol/L (ref 22–32)
Calcium: 7.9 mg/dL — ABNORMAL LOW (ref 8.9–10.3)
Chloride: 89 mmol/L — ABNORMAL LOW (ref 98–111)
Creatinine, Ser: 0.79 mg/dL (ref 0.44–1.00)
GFR, Estimated: >60 mL/min (ref >60)
Glucose, Bld: 111 mg/dL — ABNORMAL HIGH (ref 70–99)
Potassium: 2.2 mmol/L — CL (ref 3.5–5.1)
Sodium: 134 mmol/L — ABNORMAL LOW (ref 135–145)
Total Bilirubin: 1.8 mg/dL — ABNORMAL HIGH (ref 0.3–1.2)
Total Protein: 7.8 g/dL (ref 6.5–8.1)

## 2021-04-25 LAB — URINALYSIS, ROUTINE W REFLEX MICROSCOPIC
Bilirubin Urine: NEGATIVE
Glucose, UA: NEGATIVE mg/dL
Ketones, ur: 20 mg/dL — AB
Nitrite: NEGATIVE
Protein, ur: 100 mg/dL — AB
Specific Gravity, Urine: 1.02 (ref 1.005–1.030)
WBC, UA: >50 WBC/hpf — ABNORMAL HIGH (ref 0–5)
pH: 5 (ref 5.0–8.0)

## 2021-04-25 LAB — I-STAT BETA HCG BLOOD, ED (MC, WL, AP ONLY): I-stat hCG, quantitative: <5 m[IU]/mL (ref <5)

## 2021-04-25 LAB — CBC
HCT: 43.2 % (ref 36.0–46.0)
Hemoglobin: 15.7 g/dL — ABNORMAL HIGH (ref 12.0–15.0)
MCH: 37.4 pg — ABNORMAL HIGH (ref 26.0–34.0)
MCHC: 36.3 g/dL — ABNORMAL HIGH (ref 30.0–36.0)
MCV: 102.9 fL — ABNORMAL HIGH (ref 80.0–100.0)
Platelets: 123 10*3/uL — ABNORMAL LOW (ref 150–400)
RBC: 4.2 MIL/uL (ref 3.87–5.11)
RDW: 12.4 % (ref 11.5–15.5)
WBC: 9.3 10*3/uL (ref 4.0–10.5)
nRBC: 0 % (ref 0.0–0.2)

## 2021-04-25 LAB — HIV ANTIBODY (ROUTINE TESTING W REFLEX): HIV Screen 4th Generation wRfx: NONREACTIVE

## 2021-04-25 LAB — C DIFFICILE QUICK SCREEN W PCR REFLEX
C Diff antigen: NONREACTIVE — AB
C Diff interpretation: NEGATIVE
C Diff toxin: NONREACTIVE — AB

## 2021-04-25 LAB — RESP PANEL BY RT-PCR (FLU A&B, COVID) ARPGX2
Influenza A by PCR: NEGATIVE
Influenza B by PCR: NEGATIVE
SARS Coronavirus 2 by RT PCR: NEGATIVE

## 2021-04-25 LAB — RAPID URINE DRUG SCREEN, HOSP PERFORMED
Amphetamines: NOT DETECTED
Barbiturates: NOT DETECTED
Benzodiazepines: POSITIVE — AB
Cocaine: NOT DETECTED
Opiates: POSITIVE — AB
Tetrahydrocannabinol: NOT DETECTED

## 2021-04-25 LAB — LIPASE, BLOOD: Lipase: 43 U/L (ref 11–51)

## 2021-04-25 LAB — MAGNESIUM: Magnesium: 1.1 mg/dL — ABNORMAL LOW (ref 1.7–2.4)

## 2021-04-25 LAB — ETHANOL: Alcohol, Ethyl (B): <10 mg/dL (ref <10)

## 2021-04-25 LAB — CK: Total CK: 336 U/L — ABNORMAL HIGH (ref 38–234)

## 2021-04-25 MED ORDER — ACETAMINOPHEN 325 MG PO TABS: 650.0000 mg | ORAL_TABLET | Freq: Four times a day (QID) | ORAL | Status: DC | PRN

## 2021-04-25 MED ORDER — POTASSIUM CHLORIDE 2 MEQ/ML IV SOLN: INTRAVENOUS | Status: DC

## 2021-04-25 MED ORDER — FOLIC ACID 1 MG PO TABS
1.0000 mg | ORAL_TABLET | Freq: Every day | ORAL | Status: DC
  Administered 2021-04-25 – 2021-04-27 (×3): 1 mg via ORAL
  Filled 2021-04-25 (×3): qty 1

## 2021-04-25 MED ORDER — THIAMINE HCL 100 MG PO TABS
100.0000 mg | ORAL_TABLET | Freq: Every day | ORAL | Status: DC
  Administered 2021-04-25 – 2021-04-27 (×3): 100 mg via ORAL
  Filled 2021-04-25 (×3): qty 1

## 2021-04-25 MED ORDER — ONDANSETRON HCL 4 MG/2ML IJ SOLN: 4.0000 mg | Freq: Four times a day (QID) | INTRAMUSCULAR | Status: DC | PRN

## 2021-04-25 MED ORDER — POTASSIUM CHLORIDE 10 MEQ/100ML IV SOLN
10.0000 meq | INTRAVENOUS | Status: AC
  Administered 2021-04-25 (×4): 10 meq via INTRAVENOUS
  Filled 2021-04-25 (×4): qty 100

## 2021-04-25 MED ORDER — ACETAMINOPHEN 650 MG RE SUPP: 650.0000 mg | Freq: Four times a day (QID) | RECTAL | Status: DC | PRN

## 2021-04-25 MED ORDER — SODIUM CHLORIDE 0.9 % IV SOLN
1.0000 g | INTRAVENOUS | Status: DC
  Administered 2021-04-25: 1 g via INTRAVENOUS
  Filled 2021-04-25: qty 1
  Filled 2021-04-25: qty 10

## 2021-04-25 MED ORDER — ADULT MULTIVITAMIN W/MINERALS CH
1.0000 | ORAL_TABLET | Freq: Every day | ORAL | Status: DC
  Administered 2021-04-25 – 2021-04-27 (×3): 1 via ORAL
  Filled 2021-04-25 (×3): qty 1

## 2021-04-25 MED ORDER — MAGNESIUM SULFATE 2 GM/50ML IV SOLN
2.0000 g | Freq: Once | INTRAVENOUS | Status: AC
  Administered 2021-04-25: 2 g via INTRAVENOUS
  Filled 2021-04-25: qty 50

## 2021-04-25 MED ORDER — ONDANSETRON HCL 4 MG PO TABS: 4.0000 mg | ORAL_TABLET | Freq: Four times a day (QID) | ORAL | Status: DC | PRN

## 2021-04-25 MED ORDER — THIAMINE HCL 100 MG/ML IJ SOLN: 100.0000 mg | Freq: Every day | INTRAMUSCULAR | Status: DC

## 2021-04-25 MED ORDER — LORAZEPAM 2 MG/ML IJ SOLN: 1.0000 mg | INTRAMUSCULAR | Status: DC | PRN

## 2021-04-25 MED ORDER — DICYCLOMINE HCL 20 MG PO TABS
20.0000 mg | ORAL_TABLET | Freq: Every day | ORAL | Status: DC | PRN
Start: 2021-04-25 — End: 2021-04-28
  Filled 2021-04-25: qty 2

## 2021-04-25 MED ORDER — DOXYCYCLINE HYCLATE 100 MG PO TABS
100.0000 mg | ORAL_TABLET | Freq: Two times a day (BID) | ORAL | Status: DC
  Administered 2021-04-25 – 2021-04-27 (×4): 100 mg via ORAL
  Filled 2021-04-25 (×4): qty 1

## 2021-04-25 MED ORDER — KCL IN DEXTROSE-NACL 40-5-0.9 MEQ/L-%-% IV SOLN
INTRAVENOUS | Status: DC
  Filled 2021-04-25 (×2): qty 1000

## 2021-04-25 MED ORDER — ALBUTEROL SULFATE (2.5 MG/3ML) 0.083% IN NEBU: 2.5000 mg | INHALATION_SOLUTION | RESPIRATORY_TRACT | Status: DC | PRN

## 2021-04-25 MED ORDER — LORAZEPAM 1 MG PO TABS
1.0000 mg | ORAL_TABLET | ORAL | Status: DC | PRN
  Administered 2021-04-25 – 2021-04-27 (×4): 1 mg via ORAL
  Filled 2021-04-25 (×4): qty 1

## 2021-04-25 MED ORDER — SODIUM CHLORIDE 0.9 % IV BOLUS
1000.0000 mL | Freq: Once | INTRAVENOUS | Status: AC
  Administered 2021-04-25: 1000 mL via INTRAVENOUS

## 2021-04-25 MED ORDER — POTASSIUM CHLORIDE CRYS ER 20 MEQ PO TBCR
40.0000 meq | EXTENDED_RELEASE_TABLET | Freq: Once | ORAL | Status: AC
  Administered 2021-04-25: 40 meq via ORAL
  Filled 2021-04-25: qty 2

## 2021-04-25 MED ORDER — HYDRALAZINE HCL 20 MG/ML IJ SOLN: 5.0000 mg | Freq: Four times a day (QID) | INTRAMUSCULAR | Status: DC | PRN

## 2021-04-25 MED ORDER — DOXYCYCLINE HYCLATE 100 MG PO TABS
100.0000 mg | ORAL_TABLET | Freq: Once | ORAL | Status: AC
  Administered 2021-04-25: 100 mg via ORAL
  Filled 2021-04-25: qty 1

## 2021-04-25 MED ORDER — OXYMETAZOLINE HCL 0.05 % NA SOLN
1.0000 | Freq: Every evening | NASAL | Status: DC | PRN
  Filled 2021-04-25: qty 15

## 2021-04-25 MED ORDER — PHENOL 1.4 % MT LIQD
1.0000 | OROMUCOSAL | Status: DC | PRN
  Administered 2021-04-25: 1 via OROMUCOSAL
  Filled 2021-04-25: qty 177

## 2021-04-25 MED ORDER — SODIUM CHLORIDE 0.9% FLUSH
3.0000 mL | Freq: Two times a day (BID) | INTRAVENOUS | Status: DC
  Administered 2021-04-25 – 2021-04-27 (×3): 3 mL via INTRAVENOUS

## 2021-04-25 NOTE — ED Provider Notes (Signed)
Emergency Medicine Provider Triage Evaluation Note  Alexandra Dorsey , a 45 y.o. female  was evaluated in triage.  Pt complains of some mild abd pain, N, V, Diarrhea.  Disoriented for 1 hour this am (2:30-3:30) seems to have maybe lost consciousness.  States vomiting yesterday was only a couple times in the AM. Diarrhea every 30 minutes.   Review of Systems  Positive: NVD Negative: fever  Physical Exam  BP (!) 146/109 (BP Location: Left Arm)   Pulse (!) 122   Temp 98.6 F (37 C) (Oral)   Resp 18   Ht 5' (1.524 m)   Wt 48.1 kg   LMP 04/15/2021 (Approximate)   SpO2 99%   BMI 20.70 kg/m  Gen:   Awake, no distress   Resp:  Normal effort  MSK:   Moves extremities without difficulty  Other:  Anxious   Medical Decision Making  Medically screening exam initiated at 1:28 PM.  Appropriate orders placed.  Alexandra Dorsey was informed that the remainder of the evaluation will be completed by another provider, this initial triage assessment does not replace that evaluation, and the importance of remaining in the ED until their evaluation is complete.  Some concern for benzo use/withdrawal Certainly concerning for hypokalemia given recent low levels and more diarrhea now.    Alexandra Dorsey, [place] 04/25/21 1331    Bethann Berkshire, MD 04/25/21 1640

## 2021-04-25 NOTE — H&P (Signed)
History and Physical    Alexandra Dorsey ATF:[id number] DOB: Aug 20, 1976 DOA: 04/25/2021  PCP: Pcp, No (Confirm with patient/family/NH records and if not entered, this has to be entered at [facility] point of entry) Patient coming from: Home  I have personally briefly reviewed patient's old medical records in [facility]  Chief Complaint: Nausea vomiting diarrhea  HPI: Alexandra Dorsey is a 45 y.o. female with medical history significant of alcohol dependence, hypertension presents to the ED with 2-week history of nausea diarrhea, abdominal cramping, some muscle cramping, confusion and hallucinations.  Patient denies any fevers, no chest pain, no shortness of breath, no melena, no hematemesis, no hematochezia.  Patient does endorse some lightheadedness.  Patient does endorse some confusion.  Patient denies any focal neurological deficits. Patient presented to the ED 3 days prior to admission with similar symptoms chronic to be significantly hypokalemic and dehydrated, patient's potassium was repleted via IV and oral patient was feeling better and subsequently went home.  Patient was doing fine until her symptoms worsened 48 hours prior to admission patient also reports some confusion the day prior to admission and not remembering what happened around her house and noted to have had some bouts of hallucination.  Patient denied any trauma.  Patient denies any dysuria.  Patient did endorse some trauma to the ED physician and states last alcohol use was approximately 2 weeks prior to admission.  Patient noted to have a mild cough however recent COVID-19 PCR done on presentation to the ED was negative.  Patient also did endorse a tick bite embedded in her head several weeks ago and was stuck in a half for approximately a week.  ED Course: Patient seen in the ED comprehensive metabolic profile done with a sodium of 134, potassium of 2.2, glucose of 111, calcium 7.9, AST of 146, bilirubin of 1.8 otherwise was  within normal limits.  Magnesium level at 1.1.  Total CK of 336.  CBC with a hemoglobin of 15.7, platelet count of 123.  COVID-19 PCR pending.  Urinalysis moderate leukocytes, nitrite negative, greater than 50 WBCs, many bacteria.  Urine pregnancy test was negative on 04/22/2021.  Chest x-ray negative for any acute abnormalities.  Patient given a dose of doxycycline due to concerns for recent tick bite, 2 g of IV magnesium, oral potassium supplementation, 1 L bolus of IV fluids.  C. difficile PCR ordered.   Review of Systems: As per HPI otherwise all other systems reviewed and are negative.  Past Medical History:  Diagnosis Date   Anxiety    Hypertension     History reviewed. No pertinent surgical history.  Social History  reports that she has quit smoking. She has never used smokeless tobacco. She reports current alcohol use of about 10.0 standard drinks of alcohol per week. She reports that she does not use drugs.  Allergies  Allergen Reactions   Lisinopril Cough   Norvasc [Amlodipine Besylate] Other (See Comments)    Feet hurts    Family History  Problem Relation Age of Onset   Hypertension Mother    Hypertension Father    Stroke Maternal Grandfather    Mother deceased age 60 from coronary artery disease. Father alive age 31 with history of hypertension.   Prior to Admission medications   Medication Sig Start Date End Date Taking? Authorizing Provider  ALPRAZolam Prudy Feeler) 0.5 MG tablet Take 1 tablet (0.5 mg total) at bedtime as needed by mouth for anxiety. Patient taking differently: Take 0.5 mg by mouth  every 6 (six) hours as needed for anxiety. 09/01/17  Yes McVey, Madelaine Bhat, PA-C  amLODipine (NORVASC) 5 MG tablet Take 5 mg by mouth daily with lunch.   Yes [provider]  Aspirin-Salicylamide-Caffeine (BC HEADACHE POWDER PO) Take 1 packet by mouth daily as needed (headaches).   Yes [provider]  benzonatate (TESSALON) 200 MG capsule Take 1  capsule (200 mg total) by mouth every 8 (eight) hours. Patient taking differently: Take 200 mg by mouth every 8 (eight) hours as needed for cough. 04/22/21  Yes Dartha Lodge, PA-C  dicyclomine (BENTYL) 20 MG tablet Take 20-40 mg by mouth daily as needed (stomach cramps/diarrhea).   Yes [provider]  Multiple Vitamins-Minerals (ADULT ONE DAILY GUMMIES) CHEW Chew 1 tablet by mouth daily.   Yes [provider]  naproxen sodium (ALEVE) 220 MG tablet Take 660-880 mg by mouth daily as needed (pain).   Yes [provider]  ondansetron (ZOFRAN ODT) 4 MG disintegrating tablet 4mg  ODT q4 hours prn nausea/vomit Patient taking differently: Take 4 mg by mouth every 4 (four) hours as needed for nausea or vomiting. 04/22/21  Yes 06/23/21, PA-C  oxymetazoline (AFRIN) 0.05 % nasal spray Place 1 spray into both nostrils at bedtime as needed for congestion.   Yes [provider]  atenolol (TENORMIN) 50 MG tablet take 1 tablet daily Patient not taking: No sig reported 03/29/18   03/31/18, MD  traZODone (DESYREL) 50 MG tablet Take 0.5-1 tablets (25-50 mg total) by mouth at bedtime as needed for sleep. Patient not taking: No sig reported 10/13/17   10/15/17, [place]ene [place]    Physical Exam: Vitals:   04/25/21 1525 04/25/21 1630 04/25/21 1735 04/25/21 1800  BP: (!) 122/107 (!) 158/114 (!) 143/124 (!) 116/97  Pulse: (!) 109 100 81 89  Resp: 18 (!) 22 16 19   Temp: 98.5 F (36.9 C)     TempSrc: Oral     SpO2: 95% 97% 95% 100%  Weight:      Height:        Constitutional: NAD, calm, comfortable Vitals:   04/25/21 1525 04/25/21 1630 04/25/21 1735 04/25/21 1800  BP: (!) 122/107 (!) 158/114 (!) 143/124 (!) 116/97  Pulse: (!) 109 100 81 89  Resp: 18 (!) 22 16 19   Temp: 98.5 F (36.9 C)     TempSrc: Oral     SpO2: 95% 97% 95% 100%  Weight:      Height:       Eyes: PERRL, lids and conjunctivae normal ENMT: Mucous membranes are dry. Posterior pharynx clear of  any exudate or lesions.Normal dentition.  Neck: normal, supple, no masses, no thyromegaly Respiratory: clear to auscultation bilaterally, no wheezing, no crackles. Normal respiratory effort. No accessory muscle use.  Cardiovascular: Regular rate and rhythm, no murmurs / rubs / gallops. No extremity edema. 2+ pedal pulses. No carotid bruits.  Abdomen: Some tenderness to palpation diffusely, soft, nondistended, no rebound, no guarding.   Musculoskeletal: no clubbing / cyanosis. No joint deformity upper and lower extremities. Good ROM, no contractures. Normal muscle tone.  Skin: no rashes, lesions, ulcers. No induration Neurologic: CN 2-12 grossly intact. Sensation intact, DTR normal. Strength 5/5 in all 4.  Psychiatric: Normal judgment and insight. Alert and oriented x 3. Normal mood.  Labs on Admission: I have personally reviewed following labs and imaging studies  CBC: Recent Labs  Lab 04/22/21 1358 04/25/21 1342  WBC 3.7* 9.3  NEUTROABS 1.8  --  HGB 16.2* 15.7*  HCT 43.7 43.2  MCV 100.9* 102.9*  PLT 156 123*    Basic Metabolic Panel: Recent Labs  Lab 04/22/21 1358 04/25/21 1342  NA 138 134*  K 2.8* 2.2*  CL 92* 89*  CO2 30 31  GLUCOSE 120* 111*  BUN 7 8  CREATININE 0.58 0.79  CALCIUM 8.8* 7.9*  MG 1.7 1.1*    GFR: Estimated Creatinine Clearance: 65.1 mL/min (by C-G formula based on SCr of 0.79 mg/dL).  Liver Function Tests: Recent Labs  Lab 04/25/21 1342  AST 146*  ALT 33  ALKPHOS 71  BILITOT 1.8*  PROT 7.8  ALBUMIN 4.5    Urine analysis:    Component Value Date/Time   COLORURINE AMBER (A) 04/25/2021 1530   APPEARANCEUR HAZY (A) 04/25/2021 1530   LABSPEC 1.020 04/25/2021 1530   PHURINE 5.0 04/25/2021 1530   GLUCOSEU NEGATIVE 04/25/2021 1530   HGBUR SMALL (A) 04/25/2021 1530   BILIRUBINUR NEGATIVE 04/25/2021 1530   BILIRUBINUR negative 09/01/2017 1514   BILIRUBINUR neg 02/05/2015 1111   KETONESUR 20 (A) 04/25/2021 1530   PROTEINUR 100 (A)  04/25/2021 1530   UROBILINOGEN negative (A) 09/01/2017 1514   UROBILINOGEN 0.2 01/02/2014 0027   NITRITE NEGATIVE 04/25/2021 1530   LEUKOCYTESUR MODERATE (A) 04/25/2021 1530    Radiological Exams on Admission: DG Chest Portable 1 View  Result Date: 04/25/2021 CLINICAL DATA:  Cough. EXAM: PORTABLE CHEST 1 VIEW COMPARISON:  04/22/2021 and prior exams FINDINGS: The cardiomediastinal silhouette is unremarkable. There is no evidence of focal airspace disease, pulmonary edema, suspicious pulmonary nodule/mass, pleural effusion, or pneumothorax. No acute bony abnormalities are identified. IMPRESSION: No active disease. Electronically Signed   By: Harmon Pier M.D.   On: 04/25/2021 16:55    EKG: Independently reviewed.  Normal sinus rhythm.  No ischemic changes noted.  Assessment/Plan Principal Problem:   Nausea & vomiting Active Problems:   Hypokalemia   Hypomagnesemia   Essential hypertension   Non-intractable vomiting with nausea   Alcohol dependence with withdrawal with complication (HCC)   Dehydration   Acute lower UTI   Transaminitis   Tick bite    #1 intractable nausea vomiting diarrhea -Patient presented with a 2-week history of nausea, vomiting, diarrhea. -?  Etiology. -Urinalysis worrisome for UTI. -Check urine cultures, check abdominal films, C. difficile PCR pending. -Check a GI pathogen panel. -Place on IV fluids, IV antiemetics, supportive care. -Place on bowel rest except ice chips.  2.  Severe hypokalemia/hypomagnesemia -Secondary to GI loss secondary to problem #1. -Patient given a dose of KDur 40 mEq p.o. x1 in the ED, as well as magnesium sulfate 2 g IV x1 -Give another dose of magnesium sulfate 2 g IV x1, another dose of Kdur x1. -Place potassium and IV fluids. -Repeat labs in the morning.  3.  Dehydration -IV fluids.  4.  UTI -Check urine cultures. -IV Rocephin.  5.  Tick bite -Patient noted to have a recent tick bite with tick embedded in the  head several weeks ago and was stuck in the hair for about a week.[name] for RMSF ordered per ED physician. -Patient received a dose of doxycycline in the ED. -Place empirically on doxycycline twice daily x7 days as patient also noted to have a slight thrombocytopenia.  6.  Transaminitis -Likely secondary to chronic alcohol use. -Check an acute hepatitis panel. -Check an abdominal ultrasound. -Follow with hydration.  7.  History of alcohol use -Patient stated last alcohol use was about 1 to 2  weeks ago. -Patient with no signs of withdrawal in the ED on presentation. -Placed on Ativan withdrawal protocol, thiamine, folic acid, multivitamin.  8.  Hypertension -BP stable -Hold antihypertensive medication as patient presented with nausea vomiting diarrhea, dehydration. -Follow.  9.  Acute metabolic encephalopathy/delirium -Patient did state had some bouts of confusion yesterday however seems to have cleared up/resolved. -Replete electrolytes, hydrate with IV fluids, IV antibiotics for probable UTI, supportive care.  DVT prophylaxis: SCDs Code Status:   Full Family Communication:  Updated patient.  No family at bedside. Disposition Plan:   Patient is from:  Home  Anticipated DC to:  Home  Anticipated DC date:  3 to 4 days  Anticipated DC barriers: Electrolyte abnormality and diarrhea  Consults called: None Admission status:  Admit to inpatient/telemetry  Severity of Illness: The appropriate patient status for this patient is INPATIENT. Inpatient status is judged to be reasonable and necessary in order to provide the required intensity of service to ensure the patient's safety. The patient's presenting symptoms, physical exam findings, and initial radiographic and laboratory data in the context of their chronic comorbidities is felt to place them at high risk for further clinical deterioration. Furthermore, it is not anticipated that the patient will be medically stable for  discharge from the hospital within 2 midnights of admission. The following factors support the patient status of inpatient.   * I certify that at the point of admission it is my clinical judgment that the patient will require inpatient hospital care spanning beyond 2 midnights from the point of admission due to high intensity of service, high risk for further deterioration and high frequency of surveillance required.*    Ramiro Harvestaniel Shade Rivenbark MD [facility]  How to contact the [facility] Attending or Consulting provider 7A - 7P or covering provider during after hours 7P -7A, for this patient?   Check the care team in [facility] and look for a) attending/consulting [facility] provider listed and b) the [facility] team listed Log into [email or web address] and use [place]'s universal password to access. If you do not have the password, please contact the hospital operator. Locate the [facility] provider you are looking for under [facility] and page to a number that you can be directly reached. If you still have difficulty reaching the provider, please page the [facility] (Director on Call) for the Hospitalists listed on amion for assistance.  04/25/2021, 7:05 PM

## 2021-04-25 NOTE — ED Triage Notes (Signed)
Pt here after being seen on Thursday for similar symptoms.  Pt reports that since  being seen she has had cramping in her hand and generally not feeling well.  Pt states that she felt like she had an episode of blacking out last night.  Pt also reports a rash on her chest and two bites on her pelvic region. Pt still not feeling well and is here to be checked out.  Pt a/o x 4 and ambulatory in triage.

## 2021-04-25 NOTE — ED Notes (Signed)
CRITICAL VALUE STICKER  CRITICAL VALUE: Potassium  RECEIVER (on-site recipient of call): Larey Brick, RN  DATE & TIME NOTIFIED: 04/25/21 14:30  MESSENGER (representative from lab): Tammy, Lab tech  MD NOTIFIED: Dr. Effie Shy  TIME OF NOTIFICATION: 14:31  RESPONSE: verbal order for of oral potassium

## 2021-04-25 NOTE — ED Provider Notes (Signed)
[facility] DEPT Provider Note   CSN: [id number] Arrival date & time: 04/25/21  1222     History Chief Complaint  Patient presents with   Abdominal Pain   URI    Alexandra Dorsey is a 45 y.o. female.  The history is provided by the patient and medical records. No language interpreter was used.  Abdominal Pain Pain location:  Generalized Pain quality: cramping   Pain radiates to:  Does not radiate Pain severity:  Mild Progression:  Resolved Context: alcohol use (untill last week)   Relieved by:  Nothing Worsened by:  Nothing Associated symptoms: chills, cough, diarrhea, nausea and vomiting   Associated symptoms: no chest pain, no constipation, no dysuria, no fatigue and no shortness of breath   URI Presenting symptoms: cough   Presenting symptoms: no fatigue   Severity:  Mild Associated symptoms: no headaches, no neck pain and no wheezing       Past Medical History:  Diagnosis Date   Anxiety    Hypertension     Patient Active Problem List   Diagnosis Date Noted   Altered mental status 06/06/2017   Alcohol dependence with withdrawal with complication (HCC)    Lactic acidosis    Chills 05/29/2017   Non-intractable vomiting with nausea 05/29/2017   Acute pharyngitis 05/29/2017   Sore throat 05/29/2017   Essential hypertension 11/13/2016   Panic attacks 11/13/2016    History reviewed. No pertinent surgical history.   OB History   No obstetric history on file.     Family History  Problem Relation Age of Onset   Hypertension Mother    Hypertension Father    Stroke Maternal Grandfather     Social History   Tobacco Use   Smoking status: Former    Pack years: 0.00   Smokeless tobacco: Never  Vaping Use   Vaping Use: Never used  Substance Use Topics   Alcohol use: Yes    Alcohol/week: 10.0 standard drinks    Types: 10 Cans of beer per week    Comment: daily   Drug use: No    Home Medications Prior to Admission  medications   Medication Sig Start Date End Date Taking? Authorizing Provider  ALPRAZolam Duanne Moron) 0.5 MG tablet Take 1 tablet (0.5 mg total) at bedtime as needed by mouth for anxiety. 09/01/17   McVey, Gelene Mink, PA-C  atenolol (TENORMIN) 50 MG tablet take 1 tablet daily 03/29/18   Forrest Moron, MD  benzonatate (TESSALON) 200 MG capsule Take 1 capsule (200 mg total) by mouth every 8 (eight) hours. 04/22/21   Jacqlyn Larsen, PA-C  fluticasone (FLONASE) 50 MCG/ACT nasal spray Place 1 spray into both nostrils daily. 10/13/17   Tenna Delaine D, PA-C  ipratropium (ATROVENT) 0.03 % nasal spray Place 2 sprays into both nostrils 2 (two) times daily. 10/13/17   Tenna Delaine D, PA-C  ondansetron (ZOFRAN ODT) 4 MG disintegrating tablet 71m ODT q4 hours prn nausea/vomit 04/22/21   FJacqlyn Larsen PA-C  ranitidine (ZANTAC) 150 MG tablet Take 1 tablet (150 mg total) by mouth 2 (two) times daily. 03/21/17   SForrest Moron MD  tamsulosin (FLOMAX) 0.4 MG CAPS capsule Take 1 capsule (0.4 mg total) daily by mouth. Take 30 min after same meal daily. Patient not taking: Reported on 03/29/2018 09/01/17   McVey, EGelene Mink PA-C  traMADol-acetaminophen (ULTRACET) 37.5-325 MG tablet Take 1 tablet every 6 (six) hours as needed by mouth. Patient not taking: Reported on 03/29/2018  09/01/17   McVey, Gelene Mink, PA-C  traZODone (DESYREL) 50 MG tablet Take 0.5-1 tablets (25-50 mg total) by mouth at bedtime as needed for sleep. 10/13/17   Tenna Delaine D, PA-C    Allergies    Lisinopril and Norvasc [amlodipine besylate]  Review of Systems   Review of Systems  Constitutional:  Positive for chills. Negative for fatigue.  Respiratory:  Positive for cough. Negative for chest tightness, shortness of breath and wheezing.   Cardiovascular:  Negative for chest pain, palpitations and leg swelling.  Gastrointestinal:  Positive for abdominal pain, diarrhea, nausea and vomiting. Negative for  constipation.  Genitourinary:  Negative for dysuria and flank pain.  Musculoskeletal:  Negative for back pain, neck pain and neck stiffness.  Skin:  Negative for rash and wound.  Neurological:  Positive for tremors and light-headedness. Negative for seizures, numbness and headaches.  Psychiatric/Behavioral:  Positive for confusion and hallucinations. Negative for agitation.   All other systems reviewed and are negative.  Physical Exam Updated Vital Signs BP (!) 122/107 (BP Location: Right Arm)   Pulse (!) 109   Temp 98.5 F (36.9 C) (Oral)   Resp 18   Ht 5' (1.524 m)   Wt 48.1 kg   LMP 04/15/2021 (Approximate)   SpO2 95%   BMI 20.70 kg/m   Physical Exam Vitals and nursing note reviewed.  Constitutional:      General: She is not in acute distress.    Appearance: She is well-developed. She is not ill-appearing, toxic-appearing or diaphoretic.  HENT:     Head: Normocephalic and atraumatic.     Nose: No congestion or rhinorrhea.     Mouth/Throat:     Mouth: Mucous membranes are dry.     Pharynx: No oropharyngeal exudate or posterior oropharyngeal erythema.  Eyes:     General: No scleral icterus.    Extraocular Movements: Extraocular movements intact.     Conjunctiva/sclera: Conjunctivae normal.     Pupils: Pupils are equal, round, and reactive to light.  Cardiovascular:     Rate and Rhythm: Regular rhythm. Tachycardia present.     Heart sounds: No murmur heard. Pulmonary:     Effort: Pulmonary effort is normal. No respiratory distress.     Breath sounds: Normal breath sounds. No wheezing, rhonchi or rales.  Chest:     Chest wall: No tenderness.  Abdominal:     General: Abdomen is flat.     Palpations: Abdomen is soft.     Tenderness: There is no abdominal tenderness. There is no left CVA tenderness, guarding or rebound.  Musculoskeletal:        General: No tenderness.     Cervical back: Neck supple. No tenderness.     Right lower leg: No edema.     Left lower leg: No  edema.  Skin:    General: Skin is warm and dry.     Capillary Refill: Capillary refill takes less than 2 seconds.     Findings: Rash present.  Neurological:     General: No focal deficit present.     Mental Status: She is alert.     GCS: GCS eye subscore is 4. GCS verbal subscore is 5. GCS motor subscore is 6.     Cranial Nerves: No dysarthria or facial asymmetry.     Sensory: No sensory deficit.     Motor: Tremor present. No weakness or abnormal muscle tone.  Psychiatric:        Mood and Affect: Mood is anxious.  ED Results / Procedures / Treatments   Labs (all labs ordered are listed, but only abnormal results are displayed) Labs Reviewed  COMPREHENSIVE METABOLIC PANEL - Abnormal; Notable for the following components:      Result Value   Sodium 134 (*)    Potassium 2.2 (*)    Chloride 89 (*)    Glucose, Bld 111 (*)    Calcium 7.9 (*)    AST 146 (*)    Total Bilirubin 1.8 (*)    All other components within normal limits  CBC - Abnormal; Notable for the following components:   Hemoglobin 15.7 (*)    MCV 102.9 (*)    MCH 37.4 (*)    MCHC 36.3 (*)    Platelets 123 (*)    All other components within normal limits  URINALYSIS, ROUTINE W REFLEX MICROSCOPIC - Abnormal; Notable for the following components:   Color, Urine AMBER (*)    APPearance HAZY (*)    Hgb urine dipstick SMALL (*)    Ketones, ur 20 (*)    Protein, ur 100 (*)    Leukocytes,Ua MODERATE (*)    WBC, UA >50 (*)    Bacteria, UA MANY (*)    All other components within normal limits  MAGNESIUM - Abnormal; Notable for the following components:   Magnesium 1.1 (*)    All other components within normal limits  CK - Abnormal; Notable for the following components:   Total CK 336 (*)    All other components within normal limits  RESP PANEL BY RT-PCR (FLU A&B, COVID) ARPGX2  C DIFFICILE QUICK SCREEN W PCR REFLEX    LIPASE, BLOOD  ROCKY MTN SPOTTED FVR ABS PNL(IGG+IGM)  I-STAT BETA HCG BLOOD, ED ([facility], [facility], [facility]  ONLY)    EKG EKG Interpretation  Date/Time:  Sunday April 25 2021 15:59:16 EDT Ventricular Rate:  94 PR Interval:  151 QRS Duration: 96 QT Interval:  391 QTC Calculation: 489 R Axis:   86 Text Interpretation: Sinus rhythm Borderline prolonged QT interval When compared to prior, slower rate. No sTEMI Confirmed by Antony Blackbird [phone]) on 04/25/2021 5:17:56 PM  Radiology DG Chest Portable 1 View  Result Date: 04/25/2021 CLINICAL DATA:  Cough. EXAM: PORTABLE CHEST 1 VIEW COMPARISON:  04/22/2021 and prior exams FINDINGS: The cardiomediastinal silhouette is unremarkable. There is no evidence of focal airspace disease, pulmonary edema, suspicious pulmonary nodule/mass, pleural effusion, or pneumothorax. No acute bony abnormalities are identified. IMPRESSION: No active disease. Electronically Signed   By: Margarette Canada M.D.   On: 04/25/2021 16:55   DG Abd 2 Views  Result Date: 04/25/2021 CLINICAL DATA:  Nausea and diarrhea vomiting, concern hypokalemia EXAM: ABDOMEN - 2 VIEW COMPARISON:  September 01, 2018 FINDINGS: The bowel gas pattern is normal. There is no evidence of free air. No radio-opaque calculi. Serpiginous radiopaque opacity seen in the upright view may represent ingested material in the bowel or be external to patient. IMPRESSION: Nonobstructive bowel gas pattern. Electronically Signed   By: Dahlia Bailiff MD   On: 04/25/2021 19:34    Procedures Procedures   CRITICAL CARE Performed by: Gwenyth Allegra Sohrab Keelan Total critical care time: 35 minutes Critical care time was exclusive of separately billable procedures and treating other patients. Critical care was necessary to treat or prevent imminent or life-threatening deterioration. Critical care was time spent personally by me on the following activities: development of treatment plan with patient and/or surrogate as well as nursing, discussions with consultants, evaluation of patient's response to  treatment, examination of patient,  obtaining history from patient or surrogate, ordering and performing treatments and interventions, ordering and review of laboratory studies, ordering and review of radiographic studies, pulse oximetry and re-evaluation of patient's condition.   Medications Ordered in ED Medications  potassium chloride 10 mEq in 100 mL IVPB (has no administration in time range)  magnesium sulfate IVPB 2 g 50 mL (has no administration in time range)  sodium chloride 0.9 % bolus 1,000 mL (has no administration in time range)  doxycycline (VIBRA-TABS) tablet 100 mg (has no administration in time range)  potassium chloride SA (KLOR-CON) CR tablet 40 mEq (40 mEq Oral Given 04/25/21 1438)    ED Course  I have reviewed the triage vital signs and the nursing notes.  Pertinent labs & imaging results that were available during my care of the patient were reviewed by me and considered in my medical decision making (see chart for details).    MDM Rules/Calculators/A&P                           Alexandra Dorsey is a 45 y.o. female with a past medical history significant for hypertension, alcohol use, and anxiety who presents again with nausea, vomiting, diarrhea, abdominal cramping, muscle cramping, hallucinations, and confusion.  Patient reports that for the last 2 weeks, she has been having symptoms of some nausea, vomiting, diarrhea.  She says that she was seen 3 days ago for similar symptoms and was found to have hypokalemia with potassium 2.8.  She was given IV and oral potassium and was able to go home after feeling better she reports that Friday she had improved but then over the last 48 hours has worsened with consistent and persistent nausea, vomiting, diarrhea.  She reports that she was confused and altered yesterday and was not remembering what happened around her house, bit her tongue on the sides, and was hallucinating in her room.  She denies any acute trauma to her knowledge.  She reports no significant  urinary changes but reports any fluid she is trying to take is vomited right back up.  She does not describe any abdominal pain at this time but had some cramping earlier.  She reports that it has been a week or 2 since he drank alcohol and she has never had any alcohol withdrawal seizures or withdrawal symptoms.  She reports she has some tremor that is worsening as well.  She is unsure if she passed out yesterday but does not member present that happen.  She does report a mild cough but was negative for COVID and influenza several days ago.  On exam, patient is tremulous and nervous.  Her chest and abdomen are nontender.  Normal bowel sounds.  Lungs are clear.  Patient has some areas of what appears to be skin picking type rash on her torso but does also have some faint mild rash on her upper torso.  No focal neurologic deficits initially with normal sensation and strength throughout.  Clear speech.  Of note, patient does report that she had a tick that was embedded in her several weeks ago and was stuck in her hair for around 1 week.  Patient was seen in triage and had some screening lab work performed.  Patient had electrolytes which revealed a potassium that has worsened down to 2.2 despite home treatment.  She also has magnesium has dropped from normal down to 1.1.  Her sodium and chloride are  also low urinalysis does not show any nitrites and given her lack of urinary symptoms, I am not convinced she has UTI at this time.  Culture was sent.  We will get a repeat COVID and flu test.  She is not pregnant.  ALT and alk phos are normal but AST and bilirubin are slightly elevated.  I suspect this is related to alcohol.  CK is elevated likely related to the tremors and shaking and her reporting that she laid down a lot yesterday without much moving.  Lipase not elevated, doubt pancreatitis.  She is not a leukocytosis and has likely hemoconcentration from dehydration with elevated hemoglobin.  Due to the  potassium dropping, her altered mental status with what sounds to be acute delirium yesterday, and her persistent nausea, vomiting, diarrhea without tolerating p.o., do not feel she is safe for discharge home.  Given her report that she drank alcohol up to a week and a half ago, I have lower suspicion for alcohol withdrawal seizure related to her GI symptoms.  We have for IV potassium, IV magnesium, fluids, and we will also give her doxycycline for possible RMSF.  We will test for RMSF as well.  We will get a C. difficile as she has had diarrhea for around 2 weeks.  Anticipate admission after work-up is completed.  Patient admitted for hypokalemia and dehydration.  Final Clinical Impression(s) / ED Diagnoses Final diagnoses:  Hypokalemia  Hypomagnesemia  Transient alteration of awareness  Dehydration  Nausea vomiting and diarrhea  Tick bite of scalp, initial encounter    Clinical Impression: 1. Hypokalemia   2. Hypomagnesemia   3. Transient alteration of awareness   4. Dehydration   5. Nausea vomiting and diarrhea   6. Tick bite of scalp, initial encounter   7. Nausea & vomiting     Disposition: Admit  This note was prepared with assistance of Dragon voice recognition software. Occasional wrong-word or sound-a-like substitutions may have occurred due to the inherent limitations of voice recognition software.     Siniyah Evangelist, Gwenyth Allegra, MD 04/26/21 0003

## 2021-04-26 DIAGNOSIS — K529 Noninfective gastroenteritis and colitis, unspecified: Principal | ICD-10-CM

## 2021-04-26 LAB — PHOSPHORUS: Phosphorus: <1 mg/dL — CL (ref 2.5–4.6)

## 2021-04-26 LAB — GASTROINTESTINAL PANEL BY PCR, STOOL (REPLACES STOOL CULTURE)
Adenovirus F40/41: NOT DETECTED
Astrovirus: NOT DETECTED
Campylobacter species: NOT DETECTED
Cryptosporidium: NOT DETECTED
Cyclospora cayetanensis: NOT DETECTED
Entamoeba histolytica: NOT DETECTED
Enteroaggregative E coli (EAEC): NOT DETECTED
Enteropathogenic E coli (EPEC): NOT DETECTED
Enterotoxigenic E coli (ETEC): NOT DETECTED
Giardia lamblia: NOT DETECTED
Norovirus GI/GII: NOT DETECTED
Plesimonas shigelloides: DETECTED — AB
Rotavirus A: NOT DETECTED
Salmonella species: NOT DETECTED
Sapovirus (I, II, IV, and V): NOT DETECTED
Shiga like toxin producing E coli (STEC): NOT DETECTED
Shigella/Enteroinvasive E coli (EIEC): NOT DETECTED
Vibrio cholerae: NOT DETECTED
Vibrio species: NOT DETECTED
Yersinia enterocolitica: NOT DETECTED

## 2021-04-26 LAB — COMPREHENSIVE METABOLIC PANEL
ALT: 26 U/L (ref 0–44)
AST: 90 U/L — ABNORMAL HIGH (ref 15–41)
Albumin: 3.9 g/dL (ref 3.5–5.0)
Alkaline Phosphatase: 59 U/L (ref 38–126)
Anion gap: 8 (ref 5–15)
BUN: 5 mg/dL — ABNORMAL LOW (ref 6–20)
CO2: 25 mmol/L (ref 22–32)
Calcium: 7.6 mg/dL — ABNORMAL LOW (ref 8.9–10.3)
Chloride: 104 mmol/L (ref 98–111)
Creatinine, Ser: 0.46 mg/dL (ref 0.44–1.00)
GFR, Estimated: >60 mL/min (ref >60)
Glucose, Bld: 116 mg/dL — ABNORMAL HIGH (ref 70–99)
Potassium: 3.6 mmol/L (ref 3.5–5.1)
Sodium: 137 mmol/L (ref 135–145)
Total Bilirubin: 1 mg/dL (ref 0.3–1.2)
Total Protein: 6.7 g/dL (ref 6.5–8.1)

## 2021-04-26 LAB — CBC
HCT: 38.8 % (ref 36.0–46.0)
Hemoglobin: 13.6 g/dL (ref 12.0–15.0)
MCH: 36.7 pg — ABNORMAL HIGH (ref 26.0–34.0)
MCHC: 35.1 g/dL (ref 30.0–36.0)
MCV: 104.6 fL — ABNORMAL HIGH (ref 80.0–100.0)
Platelets: 103 10*3/uL — ABNORMAL LOW (ref 150–400)
RBC: 3.71 MIL/uL — ABNORMAL LOW (ref 3.87–5.11)
RDW: 12.4 % (ref 11.5–15.5)
WBC: 4.2 10*3/uL (ref 4.0–10.5)
nRBC: 0 % (ref 0.0–0.2)

## 2021-04-26 LAB — HEPATITIS PANEL, ACUTE
HCV Ab: NONREACTIVE
Hep A IgM: NONREACTIVE
Hep B C IgM: NONREACTIVE
Hepatitis B Surface Ag: NONREACTIVE

## 2021-04-26 LAB — MAGNESIUM: Magnesium: 2.5 mg/dL — ABNORMAL HIGH (ref 1.7–2.4)

## 2021-04-26 LAB — GLUCOSE, CAPILLARY: Glucose-Capillary: 124 mg/dL — ABNORMAL HIGH (ref 70–99)

## 2021-04-26 LAB — LACTIC ACID, PLASMA: Lactic Acid, Venous: 1.2 mmol/L (ref 0.5–1.9)

## 2021-04-26 MED ORDER — POTASSIUM PHOSPHATES 15 MMOLE/5ML IV SOLN
30.0000 mmol | Freq: Once | INTRAVENOUS | Status: AC
  Administered 2021-04-26: 30 mmol via INTRAVENOUS
  Filled 2021-04-26: qty 10

## 2021-04-26 MED ORDER — K PHOS MONO-SOD PHOS DI & MONO 155-852-130 MG PO TABS
500.0000 mg | ORAL_TABLET | Freq: Two times a day (BID) | ORAL | Status: AC
  Administered 2021-04-26 (×2): 500 mg via ORAL
  Filled 2021-04-26 (×2): qty 2

## 2021-04-26 MED ORDER — CIPROFLOXACIN IN D5W 400 MG/200ML IV SOLN
400.0000 mg | Freq: Two times a day (BID) | INTRAVENOUS | Status: DC
  Administered 2021-04-26 – 2021-04-27 (×3): 400 mg via INTRAVENOUS
  Filled 2021-04-26 (×3): qty 200

## 2021-04-26 MED ORDER — POTASSIUM CHLORIDE CRYS ER 20 MEQ PO TBCR
40.0000 meq | EXTENDED_RELEASE_TABLET | Freq: Once | ORAL | Status: AC
  Administered 2021-04-26: 40 meq via ORAL
  Filled 2021-04-26: qty 2

## 2021-04-26 MED ORDER — SODIUM CHLORIDE 0.9 % IV SOLN: INTRAVENOUS | Status: DC

## 2021-04-26 NOTE — Progress Notes (Addendum)
GI PANEL (+ Plesimonas shigelloides) result called from [facility] [facility] lab representative. Per representative [facility] notified. Hospital Provider paged with update.

## 2021-04-26 NOTE — Progress Notes (Signed)
PT Cancellation/Screen Note  Patient Details Name: DAFINA SUK MRN: [id number] DOB: [birth year]   Cancelled Treatment:    Reason Eval/Treat Not Completed: PT screened, no needs identified, will sign off. Order received. Chart reviewed. Per OT consult, pt is Mod Ind with mobility. Spoke briefly with pt who denied need for PT services. Will sign off at this time. If mobility needs change, please reorder PT consult. Thanks.    Faye Ramsay, PT Acute Rehabilitation  Office: [phone] Pager: [phone]

## 2021-04-26 NOTE — Progress Notes (Signed)
The patient was witnessed getting out of the bed and not taking IV pump/pole with her to the restroom. The IV tubing became loosed. IV tubing secured. The pt reported feelings of anxiety. Ativan 1 mg PO administered. Safety plan discussed. Pt was encouraged to call when needing to get out of bed. Bed Alarm initiated.

## 2021-04-26 NOTE — Progress Notes (Signed)
PROGRESS NOTE    Alexandra Dorsey  TMH:[id number] DOB: 01/03/1976 DOA: 04/25/2021 PCP: Pcp, No    Chief Complaint  Patient presents with   Abdominal Pain   URI    Brief Narrative:  Patient 45 year old female history of alcohol dependence, hypertension presenting with 2-week history of nausea vomiting abdominal cramping, diarrhea, confusion hallucinations x1 day.  Patient noted to have significant electrolyte abnormalities subsequently admitted.  Patient placed on IV fluids for hydration, electrolytes being repleted, C. 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 -C. difficile PCR negative. 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 -Abdominal films unremarkable.   -Patient still with ongoing multiple watery stools and has had 4 so far this morning.   -Patient had presented with nausea vomiting abdominal pain and diarrhea with severe electrolyte abnormalities and difficulty keeping oral intake down.   -We will place empirically on IV ciprofloxacin to complete a 3 to 5-day course of antibiotic treatment.  Discussed with ID.   -Trial of clear liquid diet today.   -Continue IV fluids, IV antiemetics, supportive care.   2.  Severe hypokalemia/hypomagnesemia/hypophosphatemia -Secondary to GI loss secondary to problem #1. -Patient's potassium being aggressively repleted.   -Potassium noted to  be at 2.2 on admission currently at 3.6.   -K-Dur 40 mEq p.o. x1.   -Phosphorus level <1.  K-Phos 30 mmol IV x1.   -Magnesium repleted currently at 2.5 from 1.1.   -Repeat labs in the morning.    3.  Dehydration -IV fluids.  4.  UTI -Urine cultures pending.   -Discontinue IV Rocephin and placed on IV ciprofloxacin secondary to problem #1.   -Follow.    5.  Tick bite -Patient noted to have a recent tick bite with tick embedded in the head several weeks ago and was stuck in the hair for about a week.[name] for RMSF ordered per ED physician. -Patient received a dose of doxycycline in the ED. -Continue doxycycline to complete a 7-day course.   -Outpatient follow-up.    6.  Transaminitis -Likely secondary to chronic alcohol use. -Acute hepatitis panel negative.  -Renal ultrasound pending.   -LFTs trending down with hydration.   -Continue IV fluids.   -Follow.   7.  History of alcohol use -Patient stated last alcohol use was about 1 to 2 weeks ago. -Patient with no signs of withdrawal. -Continue Ativan withdrawal protocol, thiamine, folic acid, multivitamin.  8.  Hypertension -Continue to hold antihypertensive medications.   -Continue IV fluids.   -Follow.   9.  Acute metabolic encephalopathy/delirium -Patient did state had some bouts of confusion the day prior to admission, however seems to have cleared up/resolved. -Patient hydrated IV fluids, continue to replete electrolytes, continue empiric antibiotics for UTI and gastroenteritis.  Supportive care.      DVT prophylaxis: SCDs Code Status: Full Family Communication: Updated patient.  No family at bedside Disposition:   Status is: Inpatient  Remains inpatient appropriate because:IV treatments appropriate  due to intensity of illness or inability to take PO  Dispo: The patient is from: Home              Anticipated d/c is to: Home              Patient currently is not medically stable to d/c.   Difficult to  place patient No       Consultants:  Curb sided ID: Dr. Drue SecondSnider  Procedures:  Acute abdominal series 04/25/2021  Antimicrobials:  IV Rocephin 04/25/2021>>>> 04/26/2021 IV ciprofloxacin 04/26/2021>>>> Doxycycline 04/25/2021>>>> 05/02/2021   Subjective: Patient sitting up in bed.  Denies any nausea or vomiting.  Denies any significant abdominal pain.  Denies any dysuria.  States had 4 watery loose stools this morning.  Objective: Vitals:   04/25/21 2323 04/26/21 0541 04/26/21 0954 04/26/21 1241  BP: (!) 127/107 (!) 138/94 (!) 129/95 (!) 135/98  Pulse: 92 78 76 76  Resp: 18 18 15 16   Temp: 98.3 F (36.8 C) 98.4 F (36.9 C) 98.4 F (36.9 C) 98.2 F (36.8 C)  TempSrc: Oral Oral Oral Oral  SpO2: 100% 98% 99% 100%  Weight:      Height:        Intake/Output Summary (Last 24 hours) at 04/26/2021 1628 Last data filed at 04/26/2021 1627 Gross per 24 hour  Intake 1613.45 ml  Output --  Net 1613.45 ml   Filed Weights   04/25/21 1252  Weight: 48.1 kg    Examination:  General exam: Appears calm and comfortable  Respiratory system: Clear to auscultation. Respiratory effort normal. Cardiovascular system: S1 & S2 heard, RRR. No JVD, murmurs, rubs, gallops or clicks. No pedal edema. Gastrointestinal system: Abdomen is nondistended, soft and nontender. No organomegaly or masses felt. Normal bowel sounds heard. Central nervous system: Alert and oriented. No focal neurological deficits. Extremities: Symmetric 5 x 5 power. Skin: No rashes, lesions or ulcers Psychiatry: Judgement and insight appear normal. Mood & affect appropriate.     Data Reviewed: I have personally reviewed following labs and imaging studies  CBC: Recent Labs  Lab 04/22/21 1358 04/25/21 1342 04/26/21 0436  WBC 3.7* 9.3 4.2  NEUTROABS 1.8  --   --   HGB 16.2* 15.7* 13.6  HCT 43.7 43.2 38.8  MCV 100.9* 102.9* 104.6*  PLT 156 123* 103*    Basic Metabolic Panel: Recent Labs  Lab 04/22/21 1358  04/25/21 1342 04/26/21 0436  NA 138 134* 137  K 2.8* 2.2* 3.6  CL 92* 89* 104  CO2 30 31 25   GLUCOSE 120* 111* 116*  BUN 7 8 5*  CREATININE 0.58 0.79 0.46  CALCIUM 8.8* 7.9* 7.6*  MG 1.7 1.1* 2.5*  PHOS  --   --  <1.0*    GFR: Estimated Creatinine Clearance: 65.1 mL/min (by C-G formula based on SCr of 0.46 mg/dL).  Liver Function Tests: Recent Labs  Lab 04/25/21 1342 04/26/21 0436  AST 146* 90*  ALT 33 26  ALKPHOS 71 59  BILITOT 1.8* 1.0  PROT 7.8 6.7  ALBUMIN 4.5 3.9    CBG: Recent Labs  Lab 04/26/21 0711  GLUCAP 124*     Recent Results (from the past 240 hour(s))  Resp Panel by RT-PCR (Flu A&B, Covid) Nasopharyngeal Swab     Status: None   Collection Time: 04/22/21  1:59 PM   Specimen: Nasopharyngeal Swab; Nasopharyngeal(NP) swabs in vial transport medium  Result Value Ref Range Status   SARS Coronavirus 2 by RT PCR NEGATIVE NEGATIVE Final  Comment: (NOTE) SARS-CoV-2 target nucleic acids are NOT DETECTED.  The SARS-CoV-2 RNA is generally detectable in upper respiratory specimens during the acute phase of infection. The lowest concentration of SARS-CoV-2 viral copies this assay can detect is 138 copies/mL. A negative result does not preclude SARS-Cov-2 infection and should not be used as the sole basis for treatment or other patient management decisions. A negative result may occur with  improper specimen collection/handling, submission of specimen other than nasopharyngeal swab, presence of viral mutation(s) within the areas targeted by this assay, and inadequate number of viral copies(<138 copies/mL). A negative result must be combined with clinical observations, patient history, and epidemiological information. The expected result is Negative.  Fact Sheet for Patients:  BloggerCourse.com  Fact Sheet for Healthcare Providers:  SeriousBroker.it  This test is no t yet approved or cleared by the  [place] FDA and  has been authorized for detection and/or diagnosis of SARS-CoV-2 by FDA under an Emergency Use Authorization (EUA). This EUA will remain  in effect (meaning this test can be used) for the duration of the COVID-19 declaration under Section 564(b)(1) of the Act, 21 U.S.C.section 360bbb-3(b)(1), unless the authorization is terminated  or revoked sooner.       Influenza A by PCR NEGATIVE NEGATIVE Final   Influenza B by PCR NEGATIVE NEGATIVE Final    Comment: (NOTE) The Xpert Xpress SARS-CoV-2/FLU/RSV plus assay is intended as an aid in the diagnosis of influenza from Nasopharyngeal swab specimens and should not be used as a sole basis for treatment. Nasal washings and aspirates are unacceptable for Xpert Xpress SARS-CoV-2/FLU/RSV testing.  Fact Sheet for Patients: BloggerCourse.com  Fact Sheet for Healthcare Providers: SeriousBroker.it  This test is not yet approved or cleared by the [place] FDA and has been authorized for detection and/or diagnosis of SARS-CoV-2 by FDA under an Emergency Use Authorization (EUA). This EUA will remain in effect (meaning this test can be used) for the duration of the COVID-19 declaration under Section 564(b)(1) of the Act, 21 U.S.C. section 360bbb-3(b)(1), unless the authorization is terminated or revoked.  Performed at [facility], [address] [place], [place] [address]   Resp Panel by RT-PCR (Flu A&B, Covid) Nasopharyngeal Swab     Status: None   Collection Time: 04/25/21  5:40 PM   Specimen: Nasopharyngeal Swab; Nasopharyngeal(NP) swabs in vial transport medium  Result Value Ref Range Status   SARS Coronavirus 2 by RT PCR NEGATIVE NEGATIVE Final    Comment: (NOTE) SARS-CoV-2 target nucleic acids are NOT DETECTED.  The SARS-CoV-2 RNA is generally detectable in upper respiratory specimens during the acute phase of infection. The  lowest concentration of SARS-CoV-2 viral copies this assay can detect is 138 copies/mL. A negative result does not preclude SARS-Cov-2 infection and should not be used as the sole basis for treatment or other patient management decisions. A negative result may occur with  improper specimen collection/handling, submission of specimen other than nasopharyngeal swab, presence of viral mutation(s) within the areas targeted by this assay, and inadequate number of viral copies(<138 copies/mL). A negative result must be combined with clinical observations, patient history, and epidemiological information. The expected result is Negative.  Fact Sheet for Patients:  BloggerCourse.com  Fact Sheet for Healthcare Providers:  SeriousBroker.it  This test is no t yet approved or cleared by the [place] FDA and  has been authorized for detection and/or diagnosis of SARS-CoV-2 by FDA under an Emergency Use Authorization (EUA). This EUA will remain  in  effect (meaning this test can be used) for the duration of the COVID-19 declaration under Section 564(b)(1) of the Act, 21 U.S.C.section 360bbb-3(b)(1), unless the authorization is terminated  or revoked sooner.       Influenza A by PCR NEGATIVE NEGATIVE Final   Influenza B by PCR NEGATIVE NEGATIVE Final    Comment: (NOTE) The Xpert Xpress SARS-CoV-2/FLU/RSV plus assay is intended as an aid in the diagnosis of influenza from Nasopharyngeal swab specimens and should not be used as a sole basis for treatment. Nasal washings and aspirates are unacceptable for Xpert Xpress SARS-CoV-2/FLU/RSV testing.  Fact Sheet for Patients: BloggerCourse.com  Fact Sheet for Healthcare Providers: SeriousBroker.it  This test is not yet approved or cleared by the [place] FDA and has been authorized for detection and/or diagnosis of SARS-CoV-2 by FDA under  an Emergency Use Authorization (EUA). This EUA will remain in effect (meaning this test can be used) for the duration of the COVID-19 declaration under Section 564(b)(1) of the Act, 21 U.S.C. section 360bbb-3(b)(1), unless the authorization is terminated or revoked.  Performed at [facility], [address], [place], [place] [address]   Gastrointestinal Panel by PCR , Stool     Status: Abnormal   Collection Time: 04/25/21  5:49 PM   Specimen: STOOL  Result Value Ref Range Status   Campylobacter species NOT DETECTED NOT DETECTED Final   Plesimonas shigelloides DETECTED (A) NOT DETECTED Final    Comment: RESULT CALLED TO, READ BACK BY AND VERIFIED WITH: AMBER NELSON 04/26/21 @ 1510 BY S BEARD    Salmonella species NOT DETECTED NOT DETECTED Final   Yersinia enterocolitica NOT DETECTED NOT DETECTED Final   Vibrio species NOT DETECTED NOT DETECTED Final   Vibrio cholerae NOT DETECTED NOT DETECTED Final   Enteroaggregative E coli (EAEC) NOT DETECTED NOT DETECTED Final   Enteropathogenic E coli (EPEC) NOT DETECTED NOT DETECTED Final   Enterotoxigenic E coli (ETEC) NOT DETECTED NOT DETECTED Final   Shiga like toxin producing E coli (STEC) NOT DETECTED NOT DETECTED Final   Shigella/Enteroinvasive E coli (EIEC) NOT DETECTED NOT DETECTED Final   Cryptosporidium NOT DETECTED NOT DETECTED Final   Cyclospora cayetanensis NOT DETECTED NOT DETECTED Final   Entamoeba histolytica NOT DETECTED NOT DETECTED Final   Giardia lamblia NOT DETECTED NOT DETECTED Final   Adenovirus F40/41 NOT DETECTED NOT DETECTED Final   Astrovirus NOT DETECTED NOT DETECTED Final   Norovirus GI/GII NOT DETECTED NOT DETECTED Final   Rotavirus A NOT DETECTED NOT DETECTED Final   Sapovirus (I, II, IV, and V) NOT DETECTED NOT DETECTED Final    Comment: Performed at [facility], [address] [place], [place] [address]  C Difficile Quick Screen w PCR reflex     Status: Abnormal   Collection  Time: 04/25/21  5:50 PM   Specimen: STOOL  Result Value Ref Range Status   C Diff antigen NON REACTIVE (A) NEGATIVE Final   C Diff toxin NON REACTIVE (A) NEGATIVE Final   C Diff interpretation NEGATIVE  Final    Comment: Performed at [facility], [address] [place], [place] [address]         Radiology Studies: DG Chest Portable 1 View  Result Date: 04/25/2021 CLINICAL DATA:  Cough. EXAM: PORTABLE CHEST 1 VIEW COMPARISON:  04/22/2021 and prior exams FINDINGS: The cardiomediastinal silhouette is unremarkable. There is no evidence of focal airspace disease, pulmonary edema, suspicious pulmonary nodule/mass, pleural effusion, or pneumothorax. No acute bony  abnormalities are identified. IMPRESSION: No active disease. Electronically Signed   By: Harmon Pier M.D.   On: 04/25/2021 16:55   DG Abd 2 Views  Result Date: 04/25/2021 CLINICAL DATA:  Nausea and diarrhea vomiting, concern hypokalemia EXAM: ABDOMEN - 2 VIEW COMPARISON:  September 01, 2018 FINDINGS: The bowel gas pattern is normal. There is no evidence of free air. No radio-opaque calculi. Serpiginous radiopaque opacity seen in the upright view may represent ingested material in the bowel or be external to patient. IMPRESSION: Nonobstructive bowel gas pattern. Electronically Signed   By: Maudry Mayhew MD   On: 04/25/2021 19:34        Scheduled Meds:  doxycycline  100 mg Oral Q12H   folic acid  1 mg Oral Daily   multivitamin with minerals  1 tablet Oral Daily   phosphorus  500 mg Oral BID   sodium chloride flush  3 mL Intravenous Q12H   thiamine  100 mg Oral Daily   Or   thiamine  100 mg Intravenous Daily   Continuous Infusions:  sodium chloride 75 mL/hr at 04/26/21 0959   cefTRIAXone (ROCEPHIN)  IV Stopped (04/25/21 2213)   ciprofloxacin     potassium PHOSPHATE IVPB (in mmol) 85 mL/hr at 04/26/21 1627     LOS: 1 day    Time spent: 40 minutes    Ramiro Harvest, MD [facility]   To  contact the attending provider between 7A-7P or the covering provider during after hours 7P-7A, please log into the web site [email or web address] and access using universal [place] password for that web site. If you do not have the password, please call the hospital operator.  04/26/2021, 4:28 PM

## 2021-04-26 NOTE — Evaluation (Signed)
Occupational Therapy Evaluation Patient Details Name: Alexandra Dorsey MRN: [id number] DOB: September 20, 1976 Today's Date: 04/26/2021    History of Present Illness patient is a 45 year old female who presented to the hospital on 7/10 with reports of nausea/vomitting and diarrhea. patient was found to have hypokalemia, urinary tract infection, acute metabolic encephalopathy,possible tick bite, and dehydration. PMH: recent hospitalization for dehydration and hypokalemia,h/o alcohol use,   Clinical Impression   Patient is a 45 year old female who was sitting in bed on knees attempting to finish ice pop at start of session. Patient was independent in ADLs and functional mobility with no AD at prior level. Patient was noted to be at baseline for ADL tasks with set up for IV line management during tasks. Nursing made aware of concerns with IV line management.patient completed bed mobility, toileting and transfers in room with set up for IV line. Patient reported not needing skilled OT services at this time. patient does not have any acute OT needs at this time.  No OT follow up recommended at this time.     Follow Up Recommendations  No OT follow up    Equipment Recommendations       Recommendations for Other Services       Precautions / Restrictions Precautions Precautions: Fall Restrictions Weight Bearing Restrictions: No      Mobility Bed Mobility Overal bed mobility: Modified Independent                  Transfers Overall transfer level: Modified independent               General transfer comment: patient needed cues to manage IV lines but was able to complete rest of task once set up.    Balance Overall balance assessment: Modified Independent                                         ADL either performed or assessed with clinical judgement   ADL Overall ADL's : At baseline                                       General ADL  Comments: patient was noted to need assistance with IV pole and lines to prevent falling with patient noted to get tangled in lines. patient with IV pole managemetn was able to complete all tasks with noted impulsive movements.     Vision Patient Visual Report: No change from baseline Vision Assessment?: No apparent visual deficits                Pertinent Vitals/Pain Pain Assessment: 0-10 Pain Score: 1  Pain Location: patient reported discomfort in bilateral IV lines. nursing made aware     Hand Dominance Right   Extremity/Trunk Assessment Upper Extremity Assessment Upper Extremity Assessment: Overall WFL for tasks assessed   Lower Extremity Assessment Lower Extremity Assessment: Overall WFL for tasks assessed   Cervical / Trunk Assessment Cervical / Trunk Assessment: Normal   Communication Communication Communication: No difficulties   Cognition Arousal/Alertness: Awake/alert Behavior During Therapy: WFL for tasks assessed/performed Overall Cognitive Status: Within Functional Limits for tasks assessed  Home Living Family/patient expects to be discharged to:: Private residence Living Arrangements: Alone Available Help at Discharge: Family (patient lives in brother in laws basement. brother in law goes out of town for work.) Type of Home: House Home Access: Stairs to enter [employer] of Steps: about 12 steps to get down to basement level. Entrance Stairs-Rails: Can reach both Home Layout: Multi-level Alternate Level Stairs-Number of Steps: about 12 steps to get down to basement level where patient lives       [employer]: Standard                Prior Functioning/Environment Level of Independence: Independent                  AM-PAC OT "6 Clicks" Daily Activity     Outcome Measure Help from another person eating meals?: None Help from another person taking  care of personal grooming?: None Help from another person toileting, which includes using toliet, bedpan, or urinal?: None Help from another person bathing (including washing, rinsing, drying)?: None Help from another person to put on and taking off regular upper body clothing?: None Help from another person to put on and taking off regular lower body clothing?: None 6 Click Score: 24   End of Session Nurse Communication:  (nurse made aware of patients need for assistance with IV lines.)  Activity Tolerance: Patient tolerated treatment well Patient left: in chair;with call bell/phone within reach (patients nurse was present in room when therapist left.)  OT Visit Diagnosis: Muscle weakness (generalized) (M62.81)                Time: 2505-3976 OT Time Calculation (min): 16 min Charges:  OT General Charges $OT Visit: 1 Visit OT Evaluation $OT Eval Low Complexity: 1 Low  Alexandra Dorsey OTR/L, MS Acute Rehabilitation Department Office# [phone] Pager# [phone]   Alexandra Dorsey 04/26/2021, 10:56 AM

## 2021-04-27 LAB — CBC WITH DIFFERENTIAL/PLATELET
Abs Immature Granulocytes: 0.03 10*3/uL (ref 0.00–0.07)
Basophils Absolute: 0 10*3/uL (ref 0.0–0.1)
Basophils Relative: 0 %
Eosinophils Absolute: 0.1 10*3/uL (ref 0.0–0.5)
Eosinophils Relative: 2 %
HCT: 38.4 % (ref 36.0–46.0)
Hemoglobin: 13.1 g/dL (ref 12.0–15.0)
Immature Granulocytes: 1 %
Lymphocytes Relative: 29 %
Lymphs Abs: 1.4 10*3/uL (ref 0.7–4.0)
MCH: 36.6 pg — ABNORMAL HIGH (ref 26.0–34.0)
MCHC: 34.1 g/dL (ref 30.0–36.0)
MCV: 107.3 fL — ABNORMAL HIGH (ref 80.0–100.0)
Monocytes Absolute: 0.3 10*3/uL (ref 0.1–1.0)
Monocytes Relative: 7 %
Neutro Abs: 3 10*3/uL (ref 1.7–7.7)
Neutrophils Relative %: 61 %
Platelets: 125 10*3/uL — ABNORMAL LOW (ref 150–400)
RBC: 3.58 MIL/uL — ABNORMAL LOW (ref 3.87–5.11)
RDW: 12.4 % (ref 11.5–15.5)
WBC: 4.8 10*3/uL (ref 4.0–10.5)
nRBC: 0 % (ref 0.0–0.2)

## 2021-04-27 LAB — COMPREHENSIVE METABOLIC PANEL
ALT: 22 U/L (ref 0–44)
AST: 62 U/L — ABNORMAL HIGH (ref 15–41)
Albumin: 3.9 g/dL (ref 3.5–5.0)
Alkaline Phosphatase: 65 U/L (ref 38–126)
Anion gap: 7 (ref 5–15)
BUN: <5 mg/dL — ABNORMAL LOW (ref 6–20)
CO2: 23 mmol/L (ref 22–32)
Calcium: 7.7 mg/dL — ABNORMAL LOW (ref 8.9–10.3)
Chloride: 107 mmol/L (ref 98–111)
Creatinine, Ser: 0.44 mg/dL (ref 0.44–1.00)
GFR, Estimated: >60 mL/min (ref >60)
Glucose, Bld: 91 mg/dL (ref 70–99)
Potassium: 3.4 mmol/L — ABNORMAL LOW (ref 3.5–5.1)
Sodium: 137 mmol/L (ref 135–145)
Total Bilirubin: 0.6 mg/dL (ref 0.3–1.2)
Total Protein: 6.8 g/dL (ref 6.5–8.1)

## 2021-04-27 LAB — PHOSPHORUS: Phosphorus: 2.5 mg/dL (ref 2.5–4.6)

## 2021-04-27 LAB — GLUCOSE, CAPILLARY: Glucose-Capillary: 100 mg/dL — ABNORMAL HIGH (ref 70–99)

## 2021-04-27 LAB — MAGNESIUM: Magnesium: 1.4 mg/dL — ABNORMAL LOW (ref 1.7–2.4)

## 2021-04-27 LAB — VITAMIN D 25 HYDROXY (VIT D DEFICIENCY, FRACTURES): Vit D, 25-Hydroxy: 29.88 ng/mL — ABNORMAL LOW (ref 30–100)

## 2021-04-27 MED ORDER — THIAMINE HCL 100 MG PO TABS
100.0000 mg | ORAL_TABLET | Freq: Every day | ORAL | Status: DC
Start: 2021-04-28 — End: 2023-11-17

## 2021-04-27 MED ORDER — K PHOS MONO-SOD PHOS DI & MONO 155-852-130 MG PO TABS: 500.0000 mg | ORAL_TABLET | Freq: Two times a day (BID) | ORAL | 0 refills | Status: AC

## 2021-04-27 MED ORDER — ONDANSETRON 4 MG PO TBDP: 4.0000 mg | ORAL_TABLET | ORAL | Status: DC | PRN

## 2021-04-27 MED ORDER — HYDROXYZINE HCL 25 MG PO TABS: 25.0000 mg | ORAL_TABLET | Freq: Three times a day (TID) | ORAL | 0 refills | Status: DC | PRN

## 2021-04-27 MED ORDER — DOXYCYCLINE HYCLATE 100 MG PO TABS: 100.0000 mg | ORAL_TABLET | Freq: Two times a day (BID) | ORAL | 0 refills | Status: AC

## 2021-04-27 MED ORDER — FOLIC ACID 1 MG PO TABS
1.0000 mg | ORAL_TABLET | Freq: Every day | ORAL | Status: DC
Start: 2021-04-28 — End: 2023-12-24

## 2021-04-27 MED ORDER — CALCIUM CARBONATE-VITAMIN D 500-200 MG-UNIT PO TABS: 2.0000 | ORAL_TABLET | Freq: Three times a day (TID) | ORAL | 0 refills | Status: DC

## 2021-04-27 MED ORDER — BENZONATATE 200 MG PO CAPS: 200.0000 mg | ORAL_CAPSULE | Freq: Three times a day (TID) | ORAL | Status: DC | PRN

## 2021-04-27 MED ORDER — CALCIUM CARBONATE-VITAMIN D 500-200 MG-UNIT PO TABS
2.0000 | ORAL_TABLET | Freq: Three times a day (TID) | ORAL | Status: DC
  Administered 2021-04-27 (×2): 2 via ORAL
  Filled 2021-04-27 (×2): qty 2

## 2021-04-27 MED ORDER — K PHOS MONO-SOD PHOS DI & MONO 155-852-130 MG PO TABS
500.0000 mg | ORAL_TABLET | Freq: Two times a day (BID) | ORAL | Status: DC
  Administered 2021-04-27: 500 mg via ORAL
  Filled 2021-04-27 (×2): qty 2

## 2021-04-27 MED ORDER — HYDROXYZINE HCL 25 MG PO TABS: 25.0000 mg | ORAL_TABLET | Freq: Three times a day (TID) | ORAL | Status: DC | PRN

## 2021-04-27 MED ORDER — MAGNESIUM SULFATE 4 GM/100ML IV SOLN
4.0000 g | Freq: Once | INTRAVENOUS | Status: AC
  Administered 2021-04-27: 4 g via INTRAVENOUS
  Filled 2021-04-27: qty 100

## 2021-04-27 MED ORDER — ACETAMINOPHEN 325 MG PO TABS: 650.0000 mg | ORAL_TABLET | Freq: Four times a day (QID) | ORAL | Status: DC | PRN

## 2021-04-27 MED ORDER — POTASSIUM CHLORIDE CRYS ER 20 MEQ PO TBCR
40.0000 meq | EXTENDED_RELEASE_TABLET | Freq: Once | ORAL | Status: AC
  Administered 2021-04-27: 40 meq via ORAL
  Filled 2021-04-27: qty 2

## 2021-04-27 MED ORDER — CIPROFLOXACIN HCL 500 MG PO TABS: 500.0000 mg | ORAL_TABLET | Freq: Two times a day (BID) | ORAL | 0 refills | Status: AC

## 2021-04-27 NOTE — TOC Initial Note (Signed)
Transition of Care [facility]) - Initial/Assessment Note    Patient Details  Name: Alexandra Dorsey MRN: [id number] Date of Birth: March 27, 1976  Transition [facility] CM/SW Contact:    Lanier Clam, RN Phone Number: 04/27/2021, 1:55 PM  Clinical Narrative: Patient sleeping. Referral for pcp, & med asst-provided with pcp resources(left in yellow pocket on door)-nsg to give to patient when she wakes up-patient has health insurance-unable to provide med asst-she has a co pay for meds.Nsg updated.                  Expected Discharge Plan: Home/Self Care Barriers to Discharge: Continued Medical Work up   Patient Goals and CMS Choice Patient states their goals for this hospitalization and ongoing recovery are:: home CMS Medicare.gov Compare Post Acute Care list provided to:: Patient    Expected Discharge Plan and Services Expected Discharge Plan: Home/Self Care   Discharge Planning Services: CM Consult   Living arrangements for the past 2 months: Single Family Home                                      Prior Living Arrangements/Services Living arrangements for the past 2 months: Single Family Home     Do you feel safe going back to the place where you live?: Yes      Need for Family Participation in Patient Care: No (Comment) Care giver support system in place?: Yes (comment)   Criminal Activity/Legal Involvement Pertinent to Current Situation/Hospitalization: No - Comment as needed  Activities of Daily Living Home Assistive Devices/Equipment: Eyeglasses ADL Screening (condition at time of admission) Patient's cognitive ability adequate to safely complete daily activities?: Yes Is the patient deaf or have difficulty hearing?: No Does the patient have difficulty seeing, even when wearing glasses/contacts?: No Does the patient have difficulty concentrating, remembering, or making decisions?: No Patient able to express need for assistance with ADLs?: Yes Does the  patient have difficulty dressing or bathing?: No Independently performs ADLs?: Yes (appropriate for developmental age) Does the patient have difficulty walking or climbing stairs?: Yes Weakness of Legs: Both Weakness of Arms/Hands: None  Permission Sought/Granted Permission sought to share information with : Case Manager Permission granted to share information with : Yes, Verbal Permission Granted  Share Information with NAME: Case Manager           Emotional Assessment Appearance:: Appears stated age Attitude/Demeanor/Rapport: Gracious Affect (typically observed): Accepting Orientation: : Oriented to Self, Oriented to Place, Oriented to  Time, Oriented to Situation Alcohol / Substance Use: Not Applicable Psych Involvement: No (comment)  Admission diagnosis:  Transient alteration of awareness [R40.4] Dehydration [E86.0] Hypokalemia [E87.6] Hypomagnesemia [E83.42] Nausea & vomiting [R11.2] Nausea vomiting and diarrhea [R11.2, R19.7] Tick bite of scalp, initial encounter [S00.06XA, W57.XXXA] Patient Active Problem List   Diagnosis Date Noted   Hypophosphatemia    Gastroenteritis    Nausea & vomiting 04/25/2021   Hypokalemia 04/25/2021   Hypomagnesemia 04/25/2021   Dehydration 04/25/2021   Acute lower UTI 04/25/2021   Transaminitis 04/25/2021   Tick bite 04/25/2021   Nausea vomiting and diarrhea    Altered mental status 06/06/2017   Alcohol dependence with withdrawal with complication (HCC)    Lactic acidosis    Chills 05/29/2017   Non-intractable vomiting with nausea 05/29/2017   Acute pharyngitis 05/29/2017   Sore throat 05/29/2017   Essential hypertension 11/13/2016   Panic attacks 11/13/2016   PCP:  Pcp, No Pharmacy:   [facility] [id number] [place], [place] - [address] [place] [address] Phone: [phone] Fax: [phone]     Social Determinants of Health (SDOH) Interventions    Readmission Risk  Interventions No flowsheet data found.

## 2021-04-27 NOTE — Discharge Summary (Signed)
Physician Discharge Summary  Alexandra Dorsey NFA:[id number] DOB: 1976-06-17 DOA: 04/25/2021  PCP: Pcp, No  Admit date: 04/25/2021 Discharge date: 04/27/2021  Time spent: 55 minutes  Recommendations for Outpatient Follow-up:  Follow-up with PCP in 1 to 2 weeks.  On follow-up patient will need a comprehensive metabolic profile, phosphorus level, magnesium level done to follow-up on electrolytes, renal function, LFTs.  Sensitivities from urine cultures will need to be followed up upon.  PTH and ionized calcium and serology for RMSFwill need to be followed up upon as these were pending at time of discharge.   Discharge Diagnoses:  Principal Problem:   Nausea vomiting and diarrhea Active Problems:   Hypokalemia   Gastroenteritis   Hypomagnesemia   Essential hypertension   Non-intractable vomiting with nausea   Alcohol dependence with withdrawal with complication (HCC)   Nausea & vomiting   Dehydration   Acute lower UTI   Transaminitis   Tick bite   Hypophosphatemia   Hypocalcemia   Discharge Condition: Stable and improved  Diet recommendation: Heart healthy  Filed Weights   04/25/21 1252  Weight: 48.1 kg    History of present illness:  Alexandra Dorsey is a 45 y.o. female with medical history significant of alcohol dependence, hypertension presents to the ED with 2-week history of nausea diarrhea, abdominal cramping, some muscle cramping, confusion and hallucinations.  Patient denies any fevers, no chest pain, no shortness of breath, no melena, no hematemesis, no hematochezia.  Patient does endorse some lightheadedness.  Patient does endorse some confusion.  Patient denies any focal neurological deficits. Patient presented to the ED 3 days prior to admission with similar symptoms chronic to be significantly hypokalemic and dehydrated, patient's potassium was repleted via IV and oral patient was feeling better and subsequently went home.  Patient was doing fine until her symptoms  worsened 48 hours prior to admission patient also reports some confusion the day prior to admission and not remembering what happened around her house and noted to have had some bouts of hallucination.  Patient denied any trauma.  Patient denies any dysuria.  Patient did endorse some trauma to the ED physician and states last alcohol use was approximately 2 weeks prior to admission.  Patient noted to have a mild cough however recent COVID-19 PCR done on presentation to the ED was negative.  Patient also did endorse a tick bite embedded in her head several weeks ago and was stuck in a half for approximately a week.   ED Course: Patient seen in the ED comprehensive metabolic profile done with a sodium of 134, potassium of 2.2, glucose of 111, calcium 7.9, AST of 146, bilirubin of 1.8 otherwise was within normal limits.  Magnesium level at 1.1.  Total CK of 336.  CBC with a hemoglobin of 15.7, platelet count of 123.  COVID-19 PCR pending.  Urinalysis moderate leukocytes, nitrite negative, greater than 50 WBCs, many bacteria.  Urine pregnancy test was negative on 04/22/2021.  Chest x-ray negative for any acute abnormalities.  Patient given a dose of doxycycline due to concerns for recent tick bite, 2 g of IV magnesium, oral potassium supplementation, 1 L bolus of IV fluids.  C. difficile PCR ordered.      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 -C. difficile PCR negative. 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 -Abdominal films unremarkable.   -Patient was hydrated aggressively with IV  fluids, electrolytes repleted.   -Patient placed empirically on IV ciprofloxacin after discussion with ID with improvement with diarrhea.   -Patient started on clear liquids and diet advanced to a soft diet which she tolerated.   -Patient be discharged home  on 4 more days of oral ciprofloxacin to complete a 5-day course of treatment.   -Outpatient follow-up with PCP.   2.  Severe hypokalemia/hypomagnesemia/hypophosphatemia -Secondary to GI loss secondary to problem #1. -Patient's potassium, magnesium, phosphorus was aggressively repleted during the hospitalization.  -Electrolytes improved and patient noted to have a potassium of 3.4, phosphorus of 2.5, magnesium of 1.4 on day of discharge.   -Patient was given oral potassium supplementation, magnesium sulfate 4 g IV x1, oral phosphorus supplementation on day of discharge.   -Patient was discharged home on 3 days of oral phosphorus supplementation.   -Outpatient follow-up with PCP in 1 week for repeat lab work and further management.  3.  Dehydration -Patient was hydrated with IV fluids  4.  Klebsiella pneumoniae UTI -Urine cultures with > 100,000 colonies of Klebsiella pneumonia with sensitivities pending at the time of discharge.   -On admission patient placed empirically on IV Rocephin and subsequently transition to IV ciprofloxacin secondary to problem #1.   -Patient will be discharged home on 4 more days of oral ciprofloxacin to complete a 5-day course of treatment for #1.  -Outpatient follow-up.   5.  Tick bite -Patient noted to have a recent tick bite with tick embedded in the head several weeks ago and was stuck in the hair for about a week.Alexandra Dorsey. -Serologies for RMSF ordered per ED physician and was pending at time of discharge.. -Patient received a dose of doxycycline in the ED. -Patient was maintained on doxycycline during the hospitalization be discharged home on 5 more days of oral doxycycline to complete a 7-day course of treatment. -Outpatient follow-up.    6.  Transaminitis -Likely secondary to chronic alcohol use. -Acute hepatitis panel negative.  -Patient hydrated with IV fluids.   -LFTs trended down.   -Outpatient follow-up with PCP.   7.  History of alcohol use -Patient  stated last alcohol use was about 1 to 2 weeks ago. -Patient with no signs of withdrawal. -Patient was placed on Ativan withdrawal protocol, thiamine, folic acid, multivitamin during the hospitalization.   -Alcohol cessation stressed to patient.    8.  Hypertension -Patient noted to have been on Norvasc prior to admission.   -Antihypertensive medications were held on admission due to dehydration and subsequently will be resumed on discharge.  -Outpatient follow-up.    9.  Acute metabolic encephalopathy/delirium -Patient did state had some bouts of confusion the day prior to admission, however seems to have cleared up/resolved. -Patient hydrated IV fluids, electrolytes repleted, patient placed empirically on antibiotics for treatment of UTI and gastroenteritis.  -Patient improved clinically and was at baseline by day of discharge.  Outpatient follow-up.  10.  Hypocalcemia -Patient noted to be hypocalcemic during the hospitalization however remained asymptomatic. -Likely secondary to vitamin D deficiency in the setting of electrolyte abnormalities of hypokalemia. -PTH was ordered and pending at time of discharge. -Vitamin D 25-hydroxy levels were decreased at 29.88. -Patient was placed on Os-Cal with vitamin D 3 times daily during the hospitalization which she will be discharged home on. -Outpatient follow-up with PCP.    Procedures: Acute abdominal series 04/25/2021    Consultations: Curb sided ID: Dr. Drue SecondSnider 04/26/2021  Discharge Exam: Vitals:   04/27/21 1000 04/27/21 1332  BP: Marland Kitchen(!)  133/98 (!) 141/95  Pulse: 84 78  Resp: 12 16  Temp:  97.9 F (36.6 C)  SpO2:  100%    General: NAD Cardiovascular: RRR Respiratory: CTAB  Discharge Instructions   Discharge Instructions     Diet - low sodium heart healthy   Complete by: As directed    Increase activity slowly   Complete by: As directed       Allergies as of 04/27/2021       Reactions   Lisinopril Cough   Norvasc  [amlodipine Besylate] Other (See Comments)   Feet hurts        Medication List     STOP taking these medications    ALPRAZolam 0.5 MG tablet Commonly known as: Xanax   atenolol 50 MG tablet Commonly known as: TENORMIN   BC HEADACHE POWDER PO   naproxen sodium 220 MG tablet Commonly known as: ALEVE   traZODone 50 MG tablet Commonly known as: DESYREL       TAKE these medications    acetaminophen 325 MG tablet Commonly known as: TYLENOL Take 2 tablets (650 mg total) by mouth every 6 (six) hours as needed for mild pain (or Fever >/= 101).   Adult One Daily Gummies Chew Chew 1 tablet by mouth daily.   amLODipine 5 MG tablet Commonly known as: NORVASC Take 5 mg by mouth daily with lunch.   benzonatate 200 MG capsule Commonly known as: TESSALON Take 1 capsule (200 mg total) by mouth every 8 (eight) hours as needed for cough.   calcium-vitamin D 500-200 MG-UNIT tablet Commonly known as: OSCAL WITH D Take 2 tablets by mouth 3 (three) times daily for 10 days.   ciprofloxacin 500 MG tablet Commonly known as: CIPRO Take 1 tablet (500 mg total) by mouth 2 (two) times daily for 4 days.   dicyclomine 20 MG tablet Commonly known as: BENTYL Take 20-40 mg by mouth daily as needed (stomach cramps/diarrhea).   doxycycline 100 MG tablet Commonly known as: VIBRA-TABS Take 1 tablet (100 mg total) by mouth every 12 (twelve) hours for 5 days.   folic acid 1 MG tablet Commonly known as: FOLVITE Take 1 tablet (1 mg total) by mouth daily. Start taking on: April 28, 2021   hydrOXYzine 25 MG tablet Commonly known as: ATARAX/VISTARIL Take 1 tablet (25 mg total) by mouth 3 (three) times daily as needed for anxiety.   ondansetron 4 MG disintegrating tablet Commonly known as: Zofran ODT Take 1 tablet (4 mg total) by mouth every 4 (four) hours as needed for nausea or vomiting.   oxymetazoline 0.05 % nasal spray Commonly known as: AFRIN Place 1 spray into both nostrils at  bedtime as needed for congestion.   phosphorus 155-852-130 MG tablet Commonly known as: K PHOS NEUTRAL Take 2 tablets (500 mg total) by mouth 2 (two) times daily for 3 days.   thiamine 100 MG tablet Take 1 tablet (100 mg total) by mouth daily. Start taking on: April 28, 2021       Allergies  Allergen Reactions   Lisinopril Cough   Norvasc [Amlodipine Besylate] Other (See Comments)    Feet hurts    Follow-up Information     PCP. Schedule an appointment as soon as possible for a visit in 1 week(s).   Why: F/U IN 1-2 WEEKS.                 The results of significant diagnostics from this hospitalization (including imaging, microbiology, ancillary and laboratory) are  listed below for reference.    Significant Diagnostic Studies: DG Chest 2 View  Result Date: 04/22/2021 CLINICAL DATA:  Congestion, cough, headache and body aches. EXAM: CHEST - 2 VIEW COMPARISON:  Single-view of the chest 06/06/2017. FINDINGS: The lungs are clear. Heart size is normal. No pneumothorax or pleural fluid. No acute or focal bony abnormality. IMPRESSION: Negative chest. Electronically Signed   By: Drusilla Kanner M.D.   On: 04/22/2021 14:44   DG Chest Portable 1 View  Result Date: 04/25/2021 CLINICAL DATA:  Cough. EXAM: PORTABLE CHEST 1 VIEW COMPARISON:  04/22/2021 and prior exams FINDINGS: The cardiomediastinal silhouette is unremarkable. There is no evidence of focal airspace disease, pulmonary edema, suspicious pulmonary nodule/mass, pleural effusion, or pneumothorax. No acute bony abnormalities are identified. IMPRESSION: No active disease. Electronically Signed   By: Harmon Pier M.D.   On: 04/25/2021 16:55   DG Abd 2 Views  Result Date: 04/25/2021 CLINICAL DATA:  Nausea and diarrhea vomiting, concern hypokalemia EXAM: ABDOMEN - 2 VIEW COMPARISON:  September 01, 2018 FINDINGS: The bowel gas pattern is normal. There is no evidence of free air. No radio-opaque calculi. Serpiginous radiopaque  opacity seen in the upright view may represent ingested material in the bowel or be external to patient. IMPRESSION: Nonobstructive bowel gas pattern. Electronically Signed   By: Maudry Mayhew MD   On: 04/25/2021 19:34    Microbiology: Recent Results (from the past 240 hour(s))  Resp Panel by RT-PCR (Flu A&B, Covid) Nasopharyngeal Swab     Status: None   Collection Time: 04/22/21  1:59 PM   Specimen: Nasopharyngeal Swab; Nasopharyngeal(NP) swabs in vial transport medium  Result Value Ref Range Status   SARS Coronavirus 2 by RT PCR NEGATIVE NEGATIVE Final    Comment: (NOTE) SARS-CoV-2 target nucleic acids are NOT DETECTED.  The SARS-CoV-2 RNA is generally detectable in upper respiratory specimens during the acute phase of infection. The lowest concentration of SARS-CoV-2 viral copies this assay can detect is 138 copies/mL. A negative result does not preclude SARS-Cov-2 infection and should not be used as the sole basis for treatment or other patient management decisions. A negative result may occur with  improper specimen collection/handling, submission of specimen other than nasopharyngeal swab, presence of viral mutation(s) within the areas targeted by this assay, and inadequate number of viral copies(<138 copies/mL). A negative result must be combined with clinical observations, patient history, and epidemiological information. The expected result is Negative.  Fact Sheet for Patients:  BloggerCourse.com  Fact Sheet for Healthcare Providers:  SeriousBroker.it  This test is no t yet approved or cleared by the [place] FDA and  has been authorized for detection and/or diagnosis of SARS-CoV-2 by FDA under an Emergency Use Authorization (EUA). This EUA will remain  in effect (meaning this test can be used) for the duration of the COVID-19 declaration under Section 564(b)(1) of the Act, 21 U.S.C.section 360bbb-3(b)(1), unless the  authorization is terminated  or revoked sooner.       Influenza A by PCR NEGATIVE NEGATIVE Final   Influenza B by PCR NEGATIVE NEGATIVE Final    Comment: (NOTE) The Xpert Xpress SARS-CoV-2/FLU/RSV plus assay is intended as an aid in the diagnosis of influenza from Nasopharyngeal swab specimens and should not be used as a sole basis for treatment. Nasal washings and aspirates are unacceptable for Xpert Xpress SARS-CoV-2/FLU/RSV testing.  Fact Sheet for Patients: BloggerCourse.com  Fact Sheet for Healthcare Providers: SeriousBroker.it  This test is not yet approved or cleared by the  [place] [employer] and has been authorized for detection and/or diagnosis of SARS-CoV-2 by FDA under an TEFL teacher (EUA). This EUA will remain in effect (meaning this test can be used) for the duration of the COVID-19 declaration under Section 564(b)(1) of the Act, 21 U.S.C. section 360bbb-3(b)(1), unless the authorization is terminated or revoked.  Performed at [facility], [address], [place], [place] [address]   Resp Panel by RT-PCR (Flu A&B, Covid) Nasopharyngeal Swab     Status: None   Collection Time: 04/25/21  5:40 PM   Specimen: Nasopharyngeal Swab; Nasopharyngeal(NP) swabs in vial transport medium  Result Value Ref Range Status   SARS Coronavirus 2 by RT PCR NEGATIVE NEGATIVE Final    Comment: (NOTE) SARS-CoV-2 target nucleic acids are NOT DETECTED.  The SARS-CoV-2 RNA is generally detectable in upper respiratory specimens during the acute phase of infection. The lowest concentration of SARS-CoV-2 viral copies this assay can detect is 138 copies/mL. A negative result does not preclude SARS-Cov-2 infection and should not be used as the sole basis for treatment or other patient management decisions. A negative result may occur with  improper specimen collection/handling, submission of specimen  other than nasopharyngeal swab, presence of viral mutation(s) within the areas targeted by this assay, and inadequate number of viral copies(<138 copies/mL). A negative result must be combined with clinical observations, patient history, and epidemiological information. The expected result is Negative.  Fact Sheet for Patients:  BloggerCourse.com  Fact Sheet for Healthcare Providers:  SeriousBroker.it  This test is no t yet approved or cleared by the [place] FDA and  has been authorized for detection and/or diagnosis of SARS-CoV-2 by FDA under an Emergency Use Authorization (EUA). This EUA will remain  in effect (meaning this test can be used) for the duration of the COVID-19 declaration under Section 564(b)(1) of the Act, 21 U.S.C.section 360bbb-3(b)(1), unless the authorization is terminated  or revoked sooner.       Influenza A by PCR NEGATIVE NEGATIVE Final   Influenza B by PCR NEGATIVE NEGATIVE Final    Comment: (NOTE) The Xpert Xpress SARS-CoV-2/FLU/RSV plus assay is intended as an aid in the diagnosis of influenza from Nasopharyngeal swab specimens and should not be used as a sole basis for treatment. Nasal washings and aspirates are unacceptable for Xpert Xpress SARS-CoV-2/FLU/RSV testing.  Fact Sheet for Patients: BloggerCourse.com  Fact Sheet for Healthcare Providers: SeriousBroker.it  This test is not yet approved or cleared by the [place] FDA and has been authorized for detection and/or diagnosis of SARS-CoV-2 by FDA under an Emergency Use Authorization (EUA). This EUA will remain in effect (meaning this test can be used) for the duration of the COVID-19 declaration under Section 564(b)(1) of the Act, 21 U.S.C. section 360bbb-3(b)(1), unless the authorization is terminated or revoked.  Performed at [facility], [address] [place], [place] [address]   Gastrointestinal Panel by PCR , Stool     Status: Abnormal   Collection Time: 04/25/21  5:49 PM   Specimen: STOOL  Result Value Ref Range Status   Campylobacter species NOT DETECTED NOT DETECTED Final   Plesimonas shigelloides DETECTED (A) NOT DETECTED Final    Comment: RESULT CALLED TO, READ BACK BY AND VERIFIED WITH: AMBER NELSON 04/26/21 @ 1510 BY S BEARD    Salmonella species NOT DETECTED NOT DETECTED Final   Yersinia enterocolitica NOT DETECTED NOT DETECTED Final   Vibrio species NOT DETECTED NOT DETECTED Final   Vibrio  cholerae NOT DETECTED NOT DETECTED Final   Enteroaggregative E coli (EAEC) NOT DETECTED NOT DETECTED Final   Enteropathogenic E coli (EPEC) NOT DETECTED NOT DETECTED Final   Enterotoxigenic E coli (ETEC) NOT DETECTED NOT DETECTED Final   Shiga like toxin producing E coli (STEC) NOT DETECTED NOT DETECTED Final   Shigella/Enteroinvasive E coli (EIEC) NOT DETECTED NOT DETECTED Final   Cryptosporidium NOT DETECTED NOT DETECTED Final   Cyclospora cayetanensis NOT DETECTED NOT DETECTED Final   Entamoeba histolytica NOT DETECTED NOT DETECTED Final   Giardia lamblia NOT DETECTED NOT DETECTED Final   Adenovirus F40/41 NOT DETECTED NOT DETECTED Final   Astrovirus NOT DETECTED NOT DETECTED Final   Norovirus GI/GII NOT DETECTED NOT DETECTED Final   Rotavirus A NOT DETECTED NOT DETECTED Final   Sapovirus (I, II, IV, and V) NOT DETECTED NOT DETECTED Final    Comment: Performed at [facility], [address] [place], [place] [address]  C Difficile Quick Screen w PCR reflex     Status: Abnormal   Collection Time: 04/25/21  5:50 PM   Specimen: STOOL  Result Value Ref Range Status   C Diff antigen NON REACTIVE (A) NEGATIVE Final   C Diff toxin NON REACTIVE (A) NEGATIVE Final   C Diff interpretation NEGATIVE  Final    Comment: Performed at [facility], [address], [place], [place] [address]  Culture, Urine      Status: Abnormal (Preliminary result)   Collection Time: 04/25/21  6:37 PM   Specimen: Urine, Random  Result Value Ref Range Status   Specimen Description   Final    URINE, RANDOM Performed at [facility], [address], [place], [place] [address]    Special Requests   Final    NONE Performed at [facility], [address] [place], [place] [address]    Culture (A)  Final    >=100,000 COLONIES/mL KLEBSIELLA PNEUMONIAE SUSCEPTIBILITIES TO FOLLOW Performed at [facility], [address] [place], [place] [address]    Report Status PENDING  Incomplete     Labs: Basic Metabolic Panel: Recent Labs  Lab 04/22/21 1358 04/25/21 1342 04/26/21 0436 04/27/21 0435  NA 138 134* 137 137  K 2.8* 2.2* 3.6 3.4*  CL 92* 89* 104 107  CO2 30 31 25 23   GLUCOSE 120* 111* 116* 91  BUN 7 8 5* <5*  CREATININE 0.58 0.79 0.46 0.44  CALCIUM 8.8* 7.9* 7.6* 7.7*  MG 1.7 1.1* 2.5* 1.4*  PHOS  --   --  <1.0* 2.5   Liver Function Tests: Recent Labs  Lab 04/25/21 1342 04/26/21 0436 04/27/21 0435  AST 146* 90* 62*  ALT 33 26 22  ALKPHOS 71 59 65  BILITOT 1.8* 1.0 0.6  PROT 7.8 6.7 6.8  ALBUMIN 4.5 3.9 3.9   Recent Labs  Lab 04/25/21 1342  LIPASE 43   No results for input(s): AMMONIA in the last 168 hours. CBC: Recent Labs  Lab 04/22/21 1358 04/25/21 1342 04/26/21 0436 04/27/21 0435  WBC 3.7* 9.3 4.2 4.8  NEUTROABS 1.8  --   --  3.0  HGB 16.2* 15.7* 13.6 13.1  HCT 43.7 43.2 38.8 38.4  MCV 100.9* 102.9* 104.6* 107.3*  PLT 156 123* 103* 125*   Cardiac Enzymes: Recent Labs  Lab 04/25/21 1342  CKTOTAL 336*   BNP: BNP (last 3 results) No results for input(s): BNP in the last 8760 hours.  ProBNP (last 3 results) No  results for input(s): PROBNP in the last 8760 hours.  CBG: Recent Labs  Lab 04/26/21 0711 04/27/21 0728  GLUCAP 124* 100*       Signed:  Ramiro Harvest MD.  [facility] 04/27/2021, 3:48 PM

## 2021-04-27 NOTE — Progress Notes (Signed)
Initial Nutrition Assessment  DOCUMENTATION CODES:   Not applicable  INTERVENTION:  - continue to encourage PO intakes.    NUTRITION DIAGNOSIS:   Inadequate oral intake related to acute illness, vomiting, diarrhea as evidenced by per patient/family report.  GOAL:   Patient will meet greater than or equal to 90% of their needs  MONITOR:   PO intake, Labs, Weight trends, I & O's  REASON FOR ASSESSMENT:   Malnutrition Screening Tool  ASSESSMENT:   45 year old female with medical history of alcohol dependence, HTN, and anxiety. She presented to the ED with 2-week history of N/V/D and abdominal cramping. She was experiencing confusion and hallucinations for 1 day PTA. In the ED she was noted to have significant electrolyte abnormalities. Cdiff PCR negative, GI pathogen panel positive for Plesimonas shigelloides.  Diet advanced from NPO to CLD on 7/11 at 0752, to FLD today at 0755, and to Soft after RD visit, at 1040.  Patient reports eating most of items on breakfast tray this AM and denied abdominal pain, pressure, or nausea after eating. She was feeling very hungry and wanting solid food.  She reports that her mom died in 09/11/20 and that since that time she has had a difficult time emotionally. She was eating but appetite was decreased and she was not eating as healthy as she may have in the past.   Patient reports chronic bouts of diarrhea so it is difficult for her to know when current bout began. She was unable to provide specific time line on when current symptoms started but did also report that she was experiencing vomiting and inability to keep anything down for a period of time. She would not experiencing nausea prior as vomiting with intakes would occur instantly.   She reports UBW of 120 lb with highest recent weight being 128-129 lb in 07/2020. On 7/10 she weighed 106 lb and on 7/7 she weighed 107 lb. Patient feels that this weight loss has been gradual since October.      Labs reviewed; CBG: 100 mg/dl, K: 3.4 mmol/l, BUN: <5 mg/dl, Ca: 7.7 mg/dl, Mg: 1.4 mg/dl. Medications reviewed; 2 tablets oscal-D TID, 1 mg folvite/day, 4 g Mg sulfate x1 run 7/12, 1 tablet multivitamin with minerals/day, 500 mg KPhos neutral BID 7/11-7/14, 40 mEq Klor-Con x1 dose 7/12, 30 mmol IV KPhos x1 run 7/11, 100 mg oral thiamine/day.     NUTRITION - FOCUSED PHYSICAL EXAM:  Completed; no muscle or fat depletions.  Diet Order:   Diet Order             DIET SOFT Room service appropriate? Yes; Fluid consistency: Thin  Diet effective now                   EDUCATION NEEDS:   No education needs have been identified at this time  Skin:  Skin Assessment: Reviewed RN Assessment  Last BM:  7/12 (type 7 at 0100 per patient report)  Height:   Ht Readings from Last 1 Encounters:  04/25/21 5' (1.524 m)    Weight:   Wt Readings from Last 1 Encounters:  04/25/21 48.1 kg      Estimated Nutritional Needs:  Kcal:  1500-1700 kcal Protein:  75-90 grams Fluid:  >/= 2 L/day       [place] Gammon, MS, RD, LDN, CNSC Inpatient Clinical Dietitian RD pager # available in [facility]  After hours/weekend pager # available in [facility]

## 2021-04-28 LAB — URINE CULTURE: Culture: >=100000 — AB

## 2021-04-28 LAB — PTH, INTACT AND CALCIUM
Calcium, Total (PTH): 7.5 mg/dL — ABNORMAL LOW (ref 8.7–10.2)
PTH: 40 pg/mL (ref 15–65)

## 2021-04-29 LAB — ROCKY MTN SPOTTED FVR ABS PNL(IGG+IGM)
RMSF IgG: NEGATIVE
RMSF IgM: 0.32 index (ref 0.00–0.89)

## 2021-05-19 ENCOUNTER — Other Ambulatory Visit: Payer: Self-pay

## 2021-05-19 ENCOUNTER — Encounter (HOSPITAL_COMMUNITY): Payer: Self-pay

## 2021-05-19 ENCOUNTER — Emergency Department (HOSPITAL_COMMUNITY): Payer: 59

## 2021-05-19 ENCOUNTER — Emergency Department (HOSPITAL_COMMUNITY)
Admission: EM | Admit: 2021-05-19 | Discharge: 2021-05-19 | Disposition: A | Discharge status: Home / Self Care | Payer: 59 | Attending: Emergency Medicine | Admitting: Emergency Medicine

## 2021-05-19 DIAGNOSIS — E876 Hypokalemia: Secondary | ICD-10-CM | POA: Insufficient documentation

## 2021-05-19 DIAGNOSIS — N390 Urinary tract infection, site not specified: Secondary | ICD-10-CM | POA: Diagnosis not present

## 2021-05-19 DIAGNOSIS — Z87891 Personal history of nicotine dependence: Secondary | ICD-10-CM | POA: Diagnosis not present

## 2021-05-19 DIAGNOSIS — N200 Calculus of kidney: Secondary | ICD-10-CM | POA: Insufficient documentation

## 2021-05-19 DIAGNOSIS — Z79899 Other long term (current) drug therapy: Secondary | ICD-10-CM | POA: Diagnosis not present

## 2021-05-19 DIAGNOSIS — I10 Essential (primary) hypertension: Secondary | ICD-10-CM | POA: Diagnosis not present

## 2021-05-19 DIAGNOSIS — B9689 Other specified bacterial agents as the cause of diseases classified elsewhere: Secondary | ICD-10-CM | POA: Diagnosis not present

## 2021-05-19 DIAGNOSIS — R112 Nausea with vomiting, unspecified: Secondary | ICD-10-CM | POA: Diagnosis present

## 2021-05-19 LAB — URINALYSIS, ROUTINE W REFLEX MICROSCOPIC
Bilirubin Urine: NEGATIVE
Glucose, UA: NEGATIVE mg/dL
Hgb urine dipstick: NEGATIVE
Ketones, ur: NEGATIVE mg/dL
Leukocytes,Ua: NEGATIVE
Nitrite: POSITIVE — AB
Protein, ur: 30 mg/dL — AB
Specific Gravity, Urine: 1.018 (ref 1.005–1.030)
pH: 5 (ref 5.0–8.0)

## 2021-05-19 LAB — COMPREHENSIVE METABOLIC PANEL
ALT: 26 U/L (ref 0–44)
AST: 52 U/L — ABNORMAL HIGH (ref 15–41)
Albumin: 4.1 g/dL (ref 3.5–5.0)
Alkaline Phosphatase: 74 U/L (ref 38–126)
Anion gap: 19 — ABNORMAL HIGH (ref 5–15)
BUN: 7 mg/dL (ref 6–20)
CO2: 25 mmol/L (ref 22–32)
Calcium: 9.1 mg/dL (ref 8.9–10.3)
Chloride: 92 mmol/L — ABNORMAL LOW (ref 98–111)
Creatinine, Ser: 0.6 mg/dL (ref 0.44–1.00)
GFR, Estimated: >60 mL/min (ref >60)
Glucose, Bld: 121 mg/dL — ABNORMAL HIGH (ref 70–99)
Potassium: 2.8 mmol/L — ABNORMAL LOW (ref 3.5–5.1)
Sodium: 136 mmol/L (ref 135–145)
Total Bilirubin: 1.5 mg/dL — ABNORMAL HIGH (ref 0.3–1.2)
Total Protein: 7.3 g/dL (ref 6.5–8.1)

## 2021-05-19 LAB — CBC
HCT: 36.9 % (ref 36.0–46.0)
Hemoglobin: 13.1 g/dL (ref 12.0–15.0)
MCH: 36 pg — ABNORMAL HIGH (ref 26.0–34.0)
MCHC: 35.5 g/dL (ref 30.0–36.0)
MCV: 101.4 fL — ABNORMAL HIGH (ref 80.0–100.0)
Platelets: 134 10*3/uL — ABNORMAL LOW (ref 150–400)
RBC: 3.64 MIL/uL — ABNORMAL LOW (ref 3.87–5.11)
RDW: 12 % (ref 11.5–15.5)
WBC: 7.6 10*3/uL (ref 4.0–10.5)
nRBC: 0 % (ref 0.0–0.2)

## 2021-05-19 LAB — I-STAT BETA HCG BLOOD, ED (MC, WL, AP ONLY): I-stat hCG, quantitative: <5 m[IU]/mL (ref <5)

## 2021-05-19 LAB — LIPASE, BLOOD: Lipase: 39 U/L (ref 11–51)

## 2021-05-19 MED ORDER — AMLODIPINE BESYLATE 5 MG PO TABS: 5.0000 mg | ORAL_TABLET | Freq: Every day | ORAL | 0 refills | Status: DC

## 2021-05-19 MED ORDER — KETOROLAC TROMETHAMINE 30 MG/ML IJ SOLN
15.0000 mg | Freq: Once | INTRAMUSCULAR | Status: AC
  Administered 2021-05-19: 15 mg via INTRAVENOUS
  Filled 2021-05-19: qty 1

## 2021-05-19 MED ORDER — IOHEXOL 350 MG/ML SOLN
100.0000 mL | Freq: Once | INTRAVENOUS | Status: AC | PRN
  Administered 2021-05-19: 80 mL via INTRAVENOUS

## 2021-05-19 MED ORDER — HYDROCODONE-ACETAMINOPHEN 5-325 MG PO TABS: 1.0000 | ORAL_TABLET | Freq: Four times a day (QID) | ORAL | 0 refills | Status: DC | PRN

## 2021-05-19 MED ORDER — CEPHALEXIN 500 MG PO CAPS: 500.0000 mg | ORAL_CAPSULE | Freq: Two times a day (BID) | ORAL | 0 refills | Status: AC

## 2021-05-19 MED ORDER — SODIUM CHLORIDE 0.9 % IV SOLN
1.0000 g | Freq: Once | INTRAVENOUS | Status: AC
  Administered 2021-05-19: 1 g via INTRAVENOUS
  Filled 2021-05-19: qty 10

## 2021-05-19 MED ORDER — HYDROMORPHONE HCL 1 MG/ML IJ SOLN
0.5000 mg | Freq: Once | INTRAMUSCULAR | Status: AC
  Administered 2021-05-19: 0.5 mg via INTRAVENOUS
  Filled 2021-05-19: qty 1

## 2021-05-19 MED ORDER — POTASSIUM CHLORIDE ER 10 MEQ PO TBCR: 10.0000 meq | EXTENDED_RELEASE_TABLET | Freq: Every day | ORAL | 0 refills | Status: DC

## 2021-05-19 MED ORDER — POTASSIUM CHLORIDE IN NACL 20-0.9 MEQ/L-% IV SOLN
Freq: Once | INTRAVENOUS | Status: AC
  Filled 2021-05-19: qty 1000

## 2021-05-19 NOTE — ED Triage Notes (Signed)
Pt endorses right sided flank pain and diarrhea since Monday. Pt was recently admitted for IV antibiotics for a bacterial infection in her stomach and also had a UTI.

## 2021-05-19 NOTE — ED Notes (Signed)
Provider at the bedside.  

## 2021-05-19 NOTE — Discharge Instructions (Addendum)
As discussed, today's evaluation has been generally reassuring, though your CT scan did demonstrate the presence of a kidney stone.  Please continue taking your previously prescribed medication and use the newly prescribed medication for additional pain control.  Please be sure to follow-up with your physician as scheduled and I discussed today's evaluation as well as your ongoing medication regimen.  Return here for concerning changes in your condition.

## 2021-05-19 NOTE — ED Provider Notes (Signed)
[facility]-EMERGENCY DEPT Provider Note   CSN: [id number] Arrival date & time: 05/19/21  [id number]     History Chief Complaint  Patient presents with   Flank Pain   Diarrhea    Alexandra Dorsey is a 45 y.o. female.  HPI Patient presents 3 weeks after being discharged from this facility now with concern for nausea, vomiting, weakness, flank pain. She was evaluated here 3 weeks ago, admitted for gastroenteritis with culture positive for Plesimonas Shigelloides.  She notes that she recovered generally well, though she did not ever fully get back up to her previous level of strength.  However, now with past 3 days she developed new bilateral flank pain, right greater than left.  Left has improved is negligible, but she still has sharp right flank pain.  No associated dysuria, hematuria.  There is associated nausea, vomiting, no fever.  No relief with OTC medication, nor Vicodin.    Past Medical History:  Diagnosis Date   Anxiety    Hypertension     Patient Active Problem List   Diagnosis Date Noted   Hypocalcemia 04/27/2021   Hypophosphatemia    Gastroenteritis    Nausea & vomiting 04/25/2021   Hypokalemia 04/25/2021   Hypomagnesemia 04/25/2021   Dehydration 04/25/2021   Acute lower UTI 04/25/2021   Transaminitis 04/25/2021   Tick bite 04/25/2021   Nausea vomiting and diarrhea    Altered mental status 06/06/2017   Alcohol dependence with withdrawal with complication (HCC)    Lactic acidosis    Chills 05/29/2017   Non-intractable vomiting with nausea 05/29/2017   Acute pharyngitis 05/29/2017   Sore throat 05/29/2017   Essential hypertension 11/13/2016   Panic attacks 11/13/2016    History reviewed. No pertinent surgical history.   OB History   No obstetric history on file.     Family History  Problem Relation Age of Onset   Hypertension Mother    Hypertension Father    Stroke Maternal Grandfather     Social History   Tobacco Use    Smoking status: Former   Smokeless tobacco: Never  Building services engineerVaping Use   Vaping Use: Never used  Substance Use Topics   Alcohol use: Yes    Alcohol/week: 10.0 standard drinks    Types: 10 Cans of beer per week    Comment: daily   Drug use: No    Home Medications Prior to Admission medications   Medication Sig Start Date End Date Taking? Authorizing Provider  cephALEXin (KEFLEX) 500 MG capsule Take 1 capsule (500 mg total) by mouth 2 (two) times daily for 5 days. 05/19/21 05/24/21 Yes Gerhard MunchLockwood, Brownie Gockel, MD  HYDROcodone-acetaminophen (NORCO/VICODIN) 5-325 MG tablet Take 1 tablet by mouth every 6 (six) hours as needed for severe pain. 05/19/21  Yes Gerhard MunchLockwood, Ayeza Therriault, MD  potassium chloride (KLOR-CON) 10 MEQ tablet Take 1 tablet (10 mEq total) by mouth daily. 05/19/21  Yes Gerhard MunchLockwood, Mansa Willers, MD  acetaminophen (TYLENOL) 325 MG tablet Take 2 tablets (650 mg total) by mouth every 6 (six) hours as needed for mild pain (or Fever >/= 101). 04/27/21   Rodolph Bonghompson, Daniel V, MD  amLODipine (NORVASC) 5 MG tablet Take 1 tablet (5 mg total) by mouth daily with lunch. 05/19/21 06/18/21  Gerhard MunchLockwood, Neshia Mckenzie, MD  benzonatate (TESSALON) 200 MG capsule Take 1 capsule (200 mg total) by mouth every 8 (eight) hours as needed for cough. 04/27/21   Rodolph Bonghompson, Daniel V, MD  calcium-vitamin D (OSCAL WITH D) 500-200 MG-UNIT tablet Take 2 tablets by mouth  3 (three) times daily for 10 days. 04/27/21 05/07/21  Rodolph Bong, MD  dicyclomine (BENTYL) 20 MG tablet Take 20-40 mg by mouth daily as needed (stomach cramps/diarrhea).    [provider]  folic acid (FOLVITE) 1 MG tablet Take 1 tablet (1 mg total) by mouth daily. 04/28/21   Rodolph Bong, MD  hydrOXYzine (ATARAX/VISTARIL) 25 MG tablet Take 1 tablet (25 mg total) by mouth 3 (three) times daily as needed for anxiety. 04/27/21   Rodolph Bong, MD  Multiple Vitamins-Minerals (ADULT ONE DAILY GUMMIES) CHEW Chew 1 tablet by mouth daily.    [provider]  ondansetron  (ZOFRAN ODT) 4 MG disintegrating tablet Take 1 tablet (4 mg total) by mouth every 4 (four) hours as needed for nausea or vomiting. 04/27/21   Rodolph Bong, MD  oxymetazoline (AFRIN) 0.05 % nasal spray Place 1 spray into both nostrils at bedtime as needed for congestion.    [provider]  thiamine 100 MG tablet Take 1 tablet (100 mg total) by mouth daily. 04/28/21   Rodolph Bong, MD    Allergies    Lisinopril and Norvasc [amlodipine besylate]  Review of Systems   Review of Systems  Constitutional:        Per HPI, otherwise negative  HENT:         Per HPI, otherwise negative  Respiratory:         Per HPI, otherwise negative  Cardiovascular:        Per HPI, otherwise negative  Gastrointestinal:  Positive for nausea and vomiting.  Endocrine:       Negative aside from HPI  Genitourinary:        Neg aside from HPI   Musculoskeletal:        Per HPI, otherwise negative  Skin: Negative.   Neurological:  Negative for syncope.   Physical Exam Updated Vital Signs BP (!) 153/96 (BP Location: Left Arm)   Pulse 84   Temp 98.6 F (37 C) (Oral)   Resp 14   SpO2 100%   Physical Exam Vitals and nursing note reviewed.  Constitutional:      General: She is not in acute distress.    Appearance: She is well-developed.  HENT:     Head: Normocephalic and atraumatic.  Eyes:     Conjunctiva/sclera: Conjunctivae normal.  Cardiovascular:     Rate and Rhythm: Normal rate and regular rhythm.  Pulmonary:     Effort: Pulmonary effort is normal. No respiratory distress.     Breath sounds: Normal breath sounds. No stridor.  Abdominal:     General: There is no distension.     Tenderness: There is right CVA tenderness.  Skin:    General: Skin is warm and dry.  Neurological:     Mental Status: She is alert and oriented to person, place, and time.     Cranial Nerves: No cranial nerve deficit.    ED Results / Procedures / Treatments   Labs (all labs ordered are listed, but  only abnormal results are displayed) Labs Reviewed  COMPREHENSIVE METABOLIC PANEL - Abnormal; Notable for the following components:      Result Value   Potassium 2.8 (*)    Chloride 92 (*)    Glucose, Bld 121 (*)    AST 52 (*)    Total Bilirubin 1.5 (*)    Anion gap 19 (*)    All other components within normal limits  CBC - Abnormal; Notable for the  following components:   RBC 3.64 (*)    MCV 101.4 (*)    MCH 36.0 (*)    Platelets 134 (*)    All other components within normal limits  URINALYSIS, ROUTINE W REFLEX MICROSCOPIC - Abnormal; Notable for the following components:   Color, Urine AMBER (*)    Protein, ur 30 (*)    Nitrite POSITIVE (*)    Bacteria, UA RARE (*)    All other components within normal limits  LIPASE, BLOOD  I-STAT BETA HCG BLOOD, ED ([facility], [facility], [facility] ONLY)    EKG None  Radiology CT ABDOMEN PELVIS W CONTRAST  Result Date: 05/19/2021 CLINICAL DATA:  45 year old female with flank pain. EXAM: CT ABDOMEN AND PELVIS WITH CONTRAST TECHNIQUE: Multidetector CT imaging of the abdomen and pelvis was performed using the standard protocol following bolus administration of intravenous contrast. CONTRAST:  80 mL OMNIPAQUE IOHEXOL 350 MG/ML SOLN COMPARISON:  CT Abdomen Pelvis, 01/02/2014. FINDINGS: Lower chest: No acute abnormality. Hepatobiliary: No focal liver abnormality is seen. No gallstones, gallbladder wall thickening, or biliary dilatation. Pancreas: Unremarkable. No pancreatic ductal dilatation or surrounding inflammatory changes. Spleen: Normal in size without focal abnormality. Adrenals/Urinary Tract: Adrenal glands are unremarkable. Punctate (<3 mm) nonobstructing calculus within the right inferior renal pole collecting system. Kidneys are otherwise normal, without focal lesion, or hydronephrosis. Bladder is unremarkable. Stomach/Bowel: Stomach is within normal limits. Appendix is retrocecal appears normal. No evidence of bowel wall thickening, distention, or inflammatory  changes. Vascular/Lymphatic: No significant vascular findings are present. No enlarged abdominal or pelvic lymph nodes. Reproductive: Uterus and bilateral adnexa are unremarkable. Other: No abdominal wall hernia or abnormality. No abdominopelvic ascites. Musculoskeletal: No acute or significant osseous findings. IMPRESSION: 1. Punctate, nonobstructing right inferior renal collecting system nephrolith. No hydronephrosis. 2. No acute abdominopelvic process. Electronically Signed   By: Roanna Banning MD   On: 05/19/2021 17:15    Procedures Procedures   Medications Ordered in ED Medications  iohexol (OMNIPAQUE) 350 MG/ML injection 100 mL (80 mLs Intravenous Contrast Given 05/19/21 1646)  0.9 % NaCl with KCl 20 mEq/ L  infusion ( Intravenous Stopped 05/19/21 2009)  ketorolac (TORADOL) 30 MG/ML injection 15 mg (15 mg Intravenous Given 05/19/21 1715)  cefTRIAXone (ROCEPHIN) 1 g in sodium chloride 0.9 % 100 mL IVPB (0 g Intravenous Stopped 05/19/21 1859)  HYDROmorphone (DILAUDID) injection 0.5 mg (0.5 mg Intravenous Given 05/19/21 1953)    ED Course  I have reviewed the triage vital signs and the nursing notes.  Pertinent labs & imaging results that were available during my care of the patient were reviewed by me and considered in my medical decision making (see chart for details).  Approved  Update:, Patient has completed fluids, pain has reduced.  She and I discussed her findings including CT evidence for stone, some evidence for concurrent infection, no evidence for obstruction, no evidence of bacteremia, sepsis, decompensated state.  Given improvement here she is appropriate for discharge.  Other notable findings include hypokalemia which has been addressed with fluids with potassium repletion.  Patient requests anxiolytic and we discussed her current regimen of Vistaril which she states is not working for her.  Given the new prescription for narcotics given her kidney stones, she was encouraged to follow-up  with her physician for anxiolytic as needed following completion of therapy for stone, infection, hypokalemia. MDM Rules/Calculators/A&P MDM Number of Diagnoses or Management Options Hypokalemia: new, needed workup Kidney stone: new, needed workup Lower urinary tract infectious disease: new, needed workup  Amount and/or Complexity of Data Reviewed Clinical lab tests: reviewed and ordered Tests in the radiology section of CPT: ordered and reviewed Tests in the medicine section of CPT: ordered and reviewed Decide to obtain previous medical records or to obtain history from someone other than the patient: yes Review and summarize past medical records: yes Independent visualization of images, tracings, or specimens: yes  Risk of Complications, Morbidity, and/or Mortality Presenting problems: high Diagnostic procedures: high Management options: high  Critical Care Total time providing critical care: < 30 minutes  Patient Progress Patient progress: improved   Final Clinical Impression(s) / ED Diagnoses Final diagnoses:  Kidney stone  Lower urinary tract infectious disease  Hypokalemia    Rx / DC Orders ED Discharge Orders          Ordered    HYDROcodone-acetaminophen (NORCO/VICODIN) 5-325 MG tablet  Every 6 hours PRN        05/19/21 2123    amLODipine (NORVASC) 5 MG tablet  Daily with lunch        05/19/21 2123    potassium chloride (KLOR-CON) 10 MEQ tablet  Daily        05/19/21 2123    cephALEXin (KEFLEX) 500 MG capsule  2 times daily        05/19/21 2124             Gerhard Munch, MD 05/19/21 2130

## 2021-11-30 ENCOUNTER — Encounter (HOSPITAL_COMMUNITY): Payer: Self-pay

## 2021-11-30 ENCOUNTER — Other Ambulatory Visit: Payer: Self-pay

## 2021-11-30 ENCOUNTER — Emergency Department (HOSPITAL_COMMUNITY)
Admission: EM | Admit: 2021-11-30 | Discharge: 2021-11-30 | Disposition: A | Discharge status: Home / Self Care | Payer: 59 | Attending: Emergency Medicine | Admitting: Emergency Medicine

## 2021-11-30 DIAGNOSIS — R10816 Epigastric abdominal tenderness: Secondary | ICD-10-CM | POA: Insufficient documentation

## 2021-11-30 DIAGNOSIS — R000 Tachycardia, unspecified: Secondary | ICD-10-CM | POA: Diagnosis not present

## 2021-11-30 DIAGNOSIS — R251 Tremor, unspecified: Secondary | ICD-10-CM | POA: Insufficient documentation

## 2021-11-30 DIAGNOSIS — R258 Other abnormal involuntary movements: Secondary | ICD-10-CM | POA: Insufficient documentation

## 2021-11-30 DIAGNOSIS — R112 Nausea with vomiting, unspecified: Secondary | ICD-10-CM | POA: Insufficient documentation

## 2021-11-30 DIAGNOSIS — Z20822 Contact with and (suspected) exposure to covid-19: Secondary | ICD-10-CM | POA: Insufficient documentation

## 2021-11-30 DIAGNOSIS — R197 Diarrhea, unspecified: Secondary | ICD-10-CM | POA: Diagnosis not present

## 2021-11-30 DIAGNOSIS — Z79899 Other long term (current) drug therapy: Secondary | ICD-10-CM | POA: Diagnosis not present

## 2021-11-30 DIAGNOSIS — F1010 Alcohol abuse, uncomplicated: Secondary | ICD-10-CM

## 2021-11-30 DIAGNOSIS — E876 Hypokalemia: Secondary | ICD-10-CM

## 2021-11-30 LAB — CBC WITH DIFFERENTIAL/PLATELET
Abs Immature Granulocytes: 0.13 10*3/uL — ABNORMAL HIGH (ref 0.00–0.07)
Basophils Absolute: 0.1 10*3/uL (ref 0.0–0.1)
Basophils Relative: 0 %
Eosinophils Absolute: 0 10*3/uL (ref 0.0–0.5)
Eosinophils Relative: 0 %
HCT: 41.2 % (ref 36.0–46.0)
Hemoglobin: 14.6 g/dL (ref 12.0–15.0)
Immature Granulocytes: 1 %
Lymphocytes Relative: 6 %
Lymphs Abs: 0.8 10*3/uL (ref 0.7–4.0)
MCH: 34.5 pg — ABNORMAL HIGH (ref 26.0–34.0)
MCHC: 35.4 g/dL (ref 30.0–36.0)
MCV: 97.4 fL (ref 80.0–100.0)
Monocytes Absolute: 0.5 10*3/uL (ref 0.1–1.0)
Monocytes Relative: 4 %
Neutro Abs: 11.2 10*3/uL — ABNORMAL HIGH (ref 1.7–7.7)
Neutrophils Relative %: 89 %
Platelets: 271 10*3/uL (ref 150–400)
RBC: 4.23 MIL/uL (ref 3.87–5.11)
RDW: 12.9 % (ref 11.5–15.5)
WBC: 12.6 10*3/uL — ABNORMAL HIGH (ref 4.0–10.5)
nRBC: 0 % (ref 0.0–0.2)

## 2021-11-30 LAB — MAGNESIUM: Magnesium: 1.7 mg/dL (ref 1.7–2.4)

## 2021-11-30 LAB — COMPREHENSIVE METABOLIC PANEL
ALT: 15 U/L (ref 0–44)
AST: 53 U/L — ABNORMAL HIGH (ref 15–41)
Albumin: 4.3 g/dL (ref 3.5–5.0)
Alkaline Phosphatase: 81 U/L (ref 38–126)
Anion gap: >20 — ABNORMAL HIGH (ref 5–15)
BUN: 9 mg/dL (ref 6–20)
CO2: 23 mmol/L (ref 22–32)
Calcium: 8.3 mg/dL — ABNORMAL LOW (ref 8.9–10.3)
Chloride: 86 mmol/L — ABNORMAL LOW (ref 98–111)
Creatinine, Ser: 0.66 mg/dL (ref 0.44–1.00)
GFR, Estimated: >60 mL/min (ref >60)
Glucose, Bld: 138 mg/dL — ABNORMAL HIGH (ref 70–99)
Potassium: 3 mmol/L — ABNORMAL LOW (ref 3.5–5.1)
Sodium: 134 mmol/L — ABNORMAL LOW (ref 135–145)
Total Bilirubin: 0.5 mg/dL (ref 0.3–1.2)
Total Protein: 7.3 g/dL (ref 6.5–8.1)

## 2021-11-30 LAB — LIPASE, BLOOD: Lipase: 60 U/L — ABNORMAL HIGH (ref 11–51)

## 2021-11-30 LAB — RESP PANEL BY RT-PCR (FLU A&B, COVID) ARPGX2
Influenza A by PCR: NEGATIVE
Influenza B by PCR: NEGATIVE
SARS Coronavirus 2 by RT PCR: NEGATIVE

## 2021-11-30 LAB — ETHANOL: Alcohol, Ethyl (B): 170 mg/dL — ABNORMAL HIGH (ref <10)

## 2021-11-30 MED ORDER — POTASSIUM CHLORIDE CRYS ER 20 MEQ PO TBCR
40.0000 meq | EXTENDED_RELEASE_TABLET | Freq: Once | ORAL | Status: AC
  Administered 2021-11-30: 40 meq via ORAL
  Filled 2021-11-30: qty 2

## 2021-11-30 MED ORDER — LACTATED RINGERS IV BOLUS
1000.0000 mL | Freq: Once | INTRAVENOUS | Status: AC
  Administered 2021-11-30: 1000 mL via INTRAVENOUS

## 2021-11-30 MED ORDER — CHLORDIAZEPOXIDE HCL 25 MG PO CAPS
50.0000 mg | ORAL_CAPSULE | Freq: Once | ORAL | Status: AC
  Administered 2021-11-30: 50 mg via ORAL
  Filled 2021-11-30: qty 2

## 2021-11-30 MED ORDER — LIDOCAINE VISCOUS HCL 2 % MT SOLN
15.0000 mL | Freq: Once | OROMUCOSAL | Status: AC
  Administered 2021-11-30: 15 mL via OROMUCOSAL
  Filled 2021-11-30: qty 15

## 2021-11-30 MED ORDER — ONDANSETRON 4 MG PO TBDP: 4.0000 mg | ORAL_TABLET | Freq: Three times a day (TID) | ORAL | 0 refills | Status: DC | PRN

## 2021-11-30 MED ORDER — ONDANSETRON HCL 4 MG/2ML IJ SOLN
4.0000 mg | Freq: Once | INTRAMUSCULAR | Status: AC
  Administered 2021-11-30: 4 mg via INTRAVENOUS
  Filled 2021-11-30: qty 2

## 2021-11-30 MED ORDER — METOCLOPRAMIDE HCL 5 MG/ML IJ SOLN
10.0000 mg | Freq: Once | INTRAMUSCULAR | Status: AC
  Administered 2021-11-30: 10 mg via INTRAVENOUS
  Filled 2021-11-30: qty 2

## 2021-11-30 MED ORDER — CHLORDIAZEPOXIDE HCL 25 MG PO CAPS: ORAL_CAPSULE | ORAL | 0 refills | Status: DC

## 2021-11-30 MED ORDER — POTASSIUM CHLORIDE CRYS ER 20 MEQ PO TBCR: 20.0000 meq | EXTENDED_RELEASE_TABLET | Freq: Two times a day (BID) | ORAL | 0 refills | Status: DC

## 2021-11-30 MED ORDER — LOPERAMIDE HCL 2 MG PO CAPS: 2.0000 mg | ORAL_CAPSULE | Freq: Four times a day (QID) | ORAL | 0 refills | Status: DC | PRN

## 2021-11-30 MED ORDER — ALUM & MAG HYDROXIDE-SIMETH 200-200-20 MG/5ML PO SUSP
30.0000 mL | Freq: Once | ORAL | Status: AC
  Administered 2021-11-30: 30 mL via ORAL
  Filled 2021-11-30: qty 30

## 2021-11-30 MED ORDER — IBUPROFEN 200 MG PO TABS
600.0000 mg | ORAL_TABLET | Freq: Once | ORAL | Status: AC
  Administered 2021-11-30: 600 mg via ORAL
  Filled 2021-11-30: qty 3

## 2021-11-30 MED ORDER — THIAMINE HCL 100 MG/ML IJ SOLN
100.0000 mg | Freq: Once | INTRAMUSCULAR | Status: AC
  Administered 2021-11-30: 100 mg via INTRAVENOUS
  Filled 2021-11-30: qty 2

## 2021-11-30 MED ORDER — PANTOPRAZOLE SODIUM 40 MG IV SOLR
40.0000 mg | Freq: Once | INTRAVENOUS | Status: AC
  Administered 2021-11-30: 40 mg via INTRAVENOUS
  Filled 2021-11-30: qty 10

## 2021-11-30 MED ORDER — LORAZEPAM 2 MG/ML IJ SOLN
1.0000 mg | Freq: Once | INTRAMUSCULAR | Status: AC
  Administered 2021-11-30: 1 mg via INTRAVENOUS
  Filled 2021-11-30: qty 1

## 2021-11-30 NOTE — ED Provider Notes (Signed)
Patient care transferred to me.  Patient overall feels poorly though otherwise seems to be hemodynamically stable.  Some mild tachycardia but this seems to be improving.  Complains of viral symptoms such as the vomiting, diarrhea, and now a sore throat.  COVID/flu testing is negative.  Overall, she is tolerating p.o. here and was given supportive care.  She will need potassium prescription.  She wants to stop alcohol and so I have prescribed Librium.  She denies SI.  I do not think she needs admission for detox.  She will be given return precautions but appears stable for discharge home.   Pricilla Loveless, MD 11/30/21 1147

## 2021-11-30 NOTE — Discharge Instructions (Addendum)
If you develop fever, uncontrolled vomiting, abdominal pain, inability to keep down fluids, headache, tremors, hallucinations/confusion, or any other new/concerning symptoms return to the ER for evaluation  It is very important to follow-up with a detox center to help get off of alcohol.  This is dangerous to do by yourself.  If at any point you feel like you are going through withdrawal you should return to the ER or call 911.

## 2021-11-30 NOTE — ED Triage Notes (Signed)
Pt biba from home after 5 days of nausea and vomiting. Last etoh was yesterday at 4pm. Drinks 1/5 of etoh daily. Denies hx of withdrawals

## 2021-11-30 NOTE — ED Provider Notes (Signed)
[facility]-EMERGENCY DEPT Provider Note   CSN: [id number] Arrival date & time: 11/30/21  0502     History  Chief Complaint  Patient presents with   Alcohol Intoxication    Alexandra Dorsey is a 46 y.o. female.  The history is provided by the patient, the EMS personnel and medical records.  Alcohol Intoxication Alexandra Dorsey is a 46 y.o. female who presents to the Emergency Department complaining of vomiting, alcohol problem.  She presents to the emergency department by EMS for evaluation of vomiting.  She has a history of heavy alcohol use but after losing her job a couple weeks ago she has increased her use to half 1/5 to a full fifth per day.  Last drink was 4 PM last night.  Around 5 PM she developed significant nausea and vomiting.  She has mild associated diarrhea.  No fevers, abdominal pain.  She has been feeling shaky.  No hallucinations.  No history of prior alcohol withdrawal.     Home Medications Prior to Admission medications   Medication Sig Start Date End Date Taking? Authorizing Provider  acetaminophen (TYLENOL) 325 MG tablet Take 2 tablets (650 mg total) by mouth every 6 (six) hours as needed for mild pain (or Fever >/= 101). 04/27/21   Rodolph Bong, MD  amLODipine (NORVASC) 5 MG tablet Take 1 tablet (5 mg total) by mouth daily with lunch. 05/19/21 06/18/21  Gerhard Munch, MD  benzonatate (TESSALON) 200 MG capsule Take 1 capsule (200 mg total) by mouth every 8 (eight) hours as needed for cough. 04/27/21   Rodolph Bong, MD  calcium-vitamin D (OSCAL WITH D) 500-200 MG-UNIT tablet Take 2 tablets by mouth 3 (three) times daily for 10 days. 04/27/21 05/07/21  Rodolph Bong, MD  dicyclomine (BENTYL) 20 MG tablet Take 20-40 mg by mouth daily as needed (stomach cramps/diarrhea).    [provider]  folic acid (FOLVITE) 1 MG tablet Take 1 tablet (1 mg total) by mouth daily. 04/28/21   Rodolph Bong, MD  HYDROcodone-acetaminophen  (NORCO/VICODIN) 5-325 MG tablet Take 1 tablet by mouth every 6 (six) hours as needed for severe pain. 05/19/21   Gerhard Munch, MD  hydrOXYzine (ATARAX/VISTARIL) 25 MG tablet Take 1 tablet (25 mg total) by mouth 3 (three) times daily as needed for anxiety. 04/27/21   Rodolph Bong, MD  Multiple Vitamins-Minerals (ADULT ONE DAILY GUMMIES) CHEW Chew 1 tablet by mouth daily.    [provider]  ondansetron (ZOFRAN ODT) 4 MG disintegrating tablet Take 1 tablet (4 mg total) by mouth every 4 (four) hours as needed for nausea or vomiting. 04/27/21   Rodolph Bong, MD  oxymetazoline (AFRIN) 0.05 % nasal spray Place 1 spray into both nostrils at bedtime as needed for congestion.    [provider]  potassium chloride (KLOR-CON) 10 MEQ tablet Take 1 tablet (10 mEq total) by mouth daily. 05/19/21   Gerhard Munch, MD  thiamine 100 MG tablet Take 1 tablet (100 mg total) by mouth daily. 04/28/21   Rodolph Bong, MD      Allergies    Lisinopril and Norvasc [amlodipine besylate]    Review of Systems   Review of Systems  All other systems reviewed and are negative.  Physical Exam Updated Vital Signs BP 114/79 (BP Location: Left Arm)    Pulse (!) 108    Resp 19    SpO2 95%  Physical Exam Vitals and nursing note reviewed.  Constitutional:  Appearance: She is well-developed.  HENT:     Head: Normocephalic and atraumatic.  Cardiovascular:     Rate and Rhythm: Regular rhythm. Tachycardia present.  Pulmonary:     Effort: Pulmonary effort is normal. No respiratory distress.  Abdominal:     Palpations: Abdomen is soft.     Tenderness: There is no guarding or rebound.     Comments: Moderate epigastric tenderness  Musculoskeletal:        General: No tenderness.  Skin:    General: Skin is warm and dry.  Neurological:     Mental Status: She is alert and oriented to person, place, and time.     Comments: Tremulous  Psychiatric:        Behavior: Behavior normal.    ED  Results / Procedures / Treatments   Labs (all labs ordered are listed, but only abnormal results are displayed) Labs Reviewed  COMPREHENSIVE METABOLIC PANEL - Abnormal; Notable for the following components:      Result Value   Sodium 134 (*)    Potassium 3.0 (*)    Chloride 86 (*)    Glucose, Bld 138 (*)    Calcium 8.3 (*)    AST 53 (*)    Anion gap >20 (*)    All other components within normal limits  LIPASE, BLOOD - Abnormal; Notable for the following components:   Lipase 60 (*)    All other components within normal limits  CBC WITH DIFFERENTIAL/PLATELET - Abnormal; Notable for the following components:   WBC 12.6 (*)    MCH 34.5 (*)    Neutro Abs 11.2 (*)    Abs Immature Granulocytes 0.13 (*)    All other components within normal limits  ETHANOL - Abnormal; Notable for the following components:   Alcohol, Ethyl (B) 170 (*)    All other components within normal limits  MAGNESIUM    EKG EKG Interpretation  Date/Time:  Tuesday November 30 2021 06:33:27 EST Ventricular Rate:  126 PR Interval:  138 QRS Duration: 84 QT Interval:  311 QTC Calculation: 451 R Axis:   89 Text Interpretation: Sinus tachycardia Anterior infarct, old Borderline T abnormalities, inferior leads Confirmed by Tilden Fossa [id number]) on 11/30/2021 6:50:22 AM  Radiology No results found.  Procedures Procedures    Medications Ordered in ED Medications  ondansetron (ZOFRAN) injection 4 mg (4 mg Intravenous Given 11/30/21 0525)  LORazepam (ATIVAN) injection 1 mg (1 mg Intravenous Given 11/30/21 0526)  lactated ringers bolus 1,000 mL (0 mLs Intravenous Stopped 11/30/21 0637)  thiamine (B-1) injection 100 mg (100 mg Intravenous Given 11/30/21 0636)  pantoprazole (PROTONIX) injection 40 mg (40 mg Intravenous Given 11/30/21 0643)  alum & mag hydroxide-simeth (MAALOX/MYLANTA) 200-200-20 MG/5ML suspension 30 mL (30 mLs Oral Given 11/30/21 0643)  chlordiazePOXIDE (LIBRIUM) capsule 50 mg (50 mg Oral Given  11/30/21 0643)  lactated ringers bolus 1,000 mL (1,000 mLs Intravenous New Bag/Given 11/30/21 0701)    ED Course/ Medical Decision Making/ A&P                           Medical Decision Making Amount and/or Complexity of Data Reviewed Labs: ordered.  Risk OTC drugs. Prescription drug management.   Patient with history of alcohol abuse here for evaluation of vomiting for the last 24 hours and inability to tolerate alcohol for the last 24 hours.  Patient dehydrated, tremulous and tachycardic on ED presentation with mild to moderate epigastric tenderness.  She was treated  with IV fluids, antiemetics as well as Ativan for possible alcohol withdrawal.  On reassessment her emesis has resolved and her abdominal tenderness has essentially resolved.  She does have persistent tachycardia.  Her tremors have resolved after Ativan.  Will treat with oral Librium for potential alcohol withdrawal.  Will provide additional IV fluid assessment.  Patient currently is unsure if she wants to stop drinking or not.  She is not actively suicidal.  Patient care transferred pending additional IV fluid hydration and reassessment following p.o. challenge.        Final Clinical Impression(s) / ED Diagnoses Final diagnoses:  None    Rx / DC Orders ED Discharge Orders     None         Tilden Fossa, MD 11/30/21 [phone]

## 2022-03-20 ENCOUNTER — Observation Stay (HOSPITAL_COMMUNITY)
Admission: EM | Admit: 2022-03-20 | Discharge: 2022-03-21 | Disposition: A | Discharge status: Home / Self Care | Payer: 59 | Attending: Internal Medicine | Admitting: Internal Medicine

## 2022-03-20 ENCOUNTER — Emergency Department (HOSPITAL_COMMUNITY): Payer: 59

## 2022-03-20 ENCOUNTER — Encounter (HOSPITAL_COMMUNITY): Payer: Self-pay

## 2022-03-20 DIAGNOSIS — F10920 Alcohol use, unspecified with intoxication, uncomplicated: Secondary | ICD-10-CM | POA: Diagnosis not present

## 2022-03-20 DIAGNOSIS — E876 Hypokalemia: Secondary | ICD-10-CM | POA: Diagnosis not present

## 2022-03-20 DIAGNOSIS — R9431 Abnormal electrocardiogram [ECG] [EKG]: Secondary | ICD-10-CM | POA: Diagnosis not present

## 2022-03-20 DIAGNOSIS — Y9289 Other specified places as the place of occurrence of the external cause: Secondary | ICD-10-CM | POA: Insufficient documentation

## 2022-03-20 DIAGNOSIS — Z87891 Personal history of nicotine dependence: Secondary | ICD-10-CM | POA: Diagnosis not present

## 2022-03-20 DIAGNOSIS — F419 Anxiety disorder, unspecified: Secondary | ICD-10-CM | POA: Diagnosis not present

## 2022-03-20 DIAGNOSIS — Z79899 Other long term (current) drug therapy: Secondary | ICD-10-CM | POA: Insufficient documentation

## 2022-03-20 DIAGNOSIS — Z23 Encounter for immunization: Secondary | ICD-10-CM | POA: Diagnosis not present

## 2022-03-20 DIAGNOSIS — S01112A Laceration without foreign body of left eyelid and periocular area, initial encounter: Principal | ICD-10-CM | POA: Insufficient documentation

## 2022-03-20 DIAGNOSIS — W01198A Fall on same level from slipping, tripping and stumbling with subsequent striking against other object, initial encounter: Secondary | ICD-10-CM | POA: Diagnosis not present

## 2022-03-20 DIAGNOSIS — I10 Essential (primary) hypertension: Secondary | ICD-10-CM | POA: Diagnosis present

## 2022-03-20 DIAGNOSIS — S098XXA Other specified injuries of head, initial encounter: Secondary | ICD-10-CM | POA: Insufficient documentation

## 2022-03-20 DIAGNOSIS — W19XXXA Unspecified fall, initial encounter: Secondary | ICD-10-CM | POA: Diagnosis not present

## 2022-03-20 DIAGNOSIS — F10929 Alcohol use, unspecified with intoxication, unspecified: Secondary | ICD-10-CM | POA: Diagnosis present

## 2022-03-20 DIAGNOSIS — F10229 Alcohol dependence with intoxication, unspecified: Secondary | ICD-10-CM | POA: Diagnosis present

## 2022-03-20 DIAGNOSIS — Y92009 Unspecified place in unspecified non-institutional (private) residence as the place of occurrence of the external cause: Secondary | ICD-10-CM

## 2022-03-20 LAB — CBC
HCT: 35 % — ABNORMAL LOW (ref 36.0–46.0)
Hemoglobin: 12.2 g/dL (ref 12.0–15.0)
MCH: 35 pg — ABNORMAL HIGH (ref 26.0–34.0)
MCHC: 34.9 g/dL (ref 30.0–36.0)
MCV: 100.3 fL — ABNORMAL HIGH (ref 80.0–100.0)
Platelets: 284 10*3/uL (ref 150–400)
RBC: 3.49 MIL/uL — ABNORMAL LOW (ref 3.87–5.11)
RDW: 14.3 % (ref 11.5–15.5)
WBC: 5 10*3/uL (ref 4.0–10.5)
nRBC: 0 % (ref 0.0–0.2)

## 2022-03-20 LAB — COMPREHENSIVE METABOLIC PANEL
ALT: 11 U/L (ref 0–44)
AST: 41 U/L (ref 15–41)
Albumin: 3.5 g/dL (ref 3.5–5.0)
Alkaline Phosphatase: 53 U/L (ref 38–126)
Anion gap: 12 (ref 5–15)
BUN: 8 mg/dL (ref 6–20)
CO2: 34 mmol/L — ABNORMAL HIGH (ref 22–32)
Calcium: 8.3 mg/dL — ABNORMAL LOW (ref 8.9–10.3)
Chloride: 95 mmol/L — ABNORMAL LOW (ref 98–111)
Creatinine, Ser: 0.61 mg/dL (ref 0.44–1.00)
GFR, Estimated: >60 mL/min (ref >60)
Glucose, Bld: 110 mg/dL — ABNORMAL HIGH (ref 70–99)
Potassium: 2.5 mmol/L — CL (ref 3.5–5.1)
Sodium: 141 mmol/L (ref 135–145)
Total Bilirubin: 0.5 mg/dL (ref 0.3–1.2)
Total Protein: 6.3 g/dL — ABNORMAL LOW (ref 6.5–8.1)

## 2022-03-20 LAB — I-STAT BETA HCG BLOOD, ED (MC, WL, AP ONLY): I-stat hCG, quantitative: <5 m[IU]/mL (ref <5)

## 2022-03-20 LAB — MAGNESIUM: Magnesium: 1.8 mg/dL (ref 1.7–2.4)

## 2022-03-20 LAB — ETHANOL: Alcohol, Ethyl (B): 431 mg/dL (ref <10)

## 2022-03-20 MED ORDER — SODIUM CHLORIDE 0.9 % IV BOLUS
1000.0000 mL | Freq: Once | INTRAVENOUS | Status: AC
Start: 2022-03-20 — End: 2022-03-20
  Administered 2022-03-20: 1000 mL via INTRAVENOUS

## 2022-03-20 MED ORDER — ONDANSETRON HCL 4 MG/2ML IJ SOLN: 4.0000 mg | Freq: Four times a day (QID) | INTRAMUSCULAR | Status: DC | PRN

## 2022-03-20 MED ORDER — ACETAMINOPHEN 650 MG RE SUPP: 650.0000 mg | Freq: Four times a day (QID) | RECTAL | Status: DC | PRN

## 2022-03-20 MED ORDER — POTASSIUM CHLORIDE CRYS ER 20 MEQ PO TBCR
40.0000 meq | EXTENDED_RELEASE_TABLET | Freq: Once | ORAL | Status: AC
  Administered 2022-03-20: 40 meq via ORAL
  Filled 2022-03-20: qty 2

## 2022-03-20 MED ORDER — LORAZEPAM 2 MG/ML IJ SOLN
1.0000 mg | Freq: Once | INTRAMUSCULAR | Status: AC
Start: 2022-03-20 — End: 2022-03-20
  Administered 2022-03-20: 1 mg via INTRAVENOUS
  Filled 2022-03-20: qty 1

## 2022-03-20 MED ORDER — MORPHINE SULFATE (PF) 2 MG/ML IV SOLN
2.0000 mg | Freq: Once | INTRAVENOUS | Status: AC
  Administered 2022-03-20: 2 mg via INTRAVENOUS
  Filled 2022-03-20: qty 1

## 2022-03-20 MED ORDER — MAGNESIUM SULFATE 2 GM/50ML IV SOLN
2.0000 g | Freq: Once | INTRAVENOUS | Status: AC
  Administered 2022-03-21: 2 g via INTRAVENOUS
  Filled 2022-03-20: qty 50

## 2022-03-20 MED ORDER — FENTANYL CITRATE PF 50 MCG/ML IJ SOSY
50.0000 ug | PREFILLED_SYRINGE | Freq: Once | INTRAMUSCULAR | Status: AC
  Administered 2022-03-21: 50 ug via INTRAVENOUS
  Filled 2022-03-20: qty 1

## 2022-03-20 MED ORDER — NALOXONE HCL 0.4 MG/ML IJ SOLN: 0.4000 mg | INTRAMUSCULAR | Status: DC | PRN

## 2022-03-20 MED ORDER — HYDROCODONE-ACETAMINOPHEN 5-325 MG PO TABS: 1.0000 | ORAL_TABLET | Freq: Four times a day (QID) | ORAL | Status: DC | PRN

## 2022-03-20 MED ORDER — THIAMINE HCL 100 MG PO TABS: 100.0000 mg | ORAL_TABLET | Freq: Every day | ORAL | Status: DC

## 2022-03-20 MED ORDER — POTASSIUM CHLORIDE CRYS ER 20 MEQ PO TBCR
20.0000 meq | EXTENDED_RELEASE_TABLET | Freq: Once | ORAL | Status: AC
  Administered 2022-03-21: 20 meq via ORAL
  Filled 2022-03-20: qty 1

## 2022-03-20 MED ORDER — ACETAMINOPHEN 325 MG PO TABS: 650.0000 mg | ORAL_TABLET | Freq: Four times a day (QID) | ORAL | Status: DC | PRN

## 2022-03-20 MED ORDER — FOLIC ACID 1 MG PO TABS: 1.0000 mg | ORAL_TABLET | Freq: Every day | ORAL | Status: DC

## 2022-03-20 MED ORDER — LIDOCAINE-EPINEPHRINE (PF) 2 %-1:200000 IJ SOLN
20.0000 mL | Freq: Once | INTRAMUSCULAR | Status: AC
  Administered 2022-03-20: 20 mL
  Filled 2022-03-20: qty 20

## 2022-03-20 MED ORDER — TETANUS-DIPHTH-ACELL PERTUSSIS 5-2.5-18.5 LF-MCG/0.5 IM SUSY
0.5000 mL | PREFILLED_SYRINGE | Freq: Once | INTRAMUSCULAR | Status: AC
Start: 2022-03-20 — End: 2022-03-20
  Administered 2022-03-20: 0.5 mL via INTRAMUSCULAR
  Filled 2022-03-20: qty 0.5

## 2022-03-20 MED ORDER — POTASSIUM CHLORIDE 10 MEQ/100ML IV SOLN
10.0000 meq | INTRAVENOUS | Status: AC
  Administered 2022-03-20 (×2): 10 meq via INTRAVENOUS
  Filled 2022-03-20 (×2): qty 100

## 2022-03-20 MED ORDER — LORAZEPAM 2 MG/ML IJ SOLN
1.0000 mg | INTRAMUSCULAR | Status: DC | PRN
  Administered 2022-03-21: 1 mg via INTRAVENOUS
  Filled 2022-03-20: qty 1

## 2022-03-20 MED ORDER — POTASSIUM CHLORIDE CRYS ER 20 MEQ PO TBCR: 20.0000 meq | EXTENDED_RELEASE_TABLET | Freq: Two times a day (BID) | ORAL | Status: DC

## 2022-03-20 MED ORDER — THIAMINE HCL 100 MG/ML IJ SOLN
100.0000 mg | Freq: Once | INTRAMUSCULAR | Status: AC
  Administered 2022-03-21: 100 mg via INTRAVENOUS
  Filled 2022-03-20: qty 2

## 2022-03-20 MED ORDER — SODIUM CHLORIDE 0.9 % IV SOLN: INTRAVENOUS | Status: DC

## 2022-03-20 MED ORDER — ONDANSETRON 4 MG PO TBDP
4.0000 mg | ORAL_TABLET | Freq: Once | ORAL | Status: DC
  Filled 2022-03-20: qty 1

## 2022-03-20 MED ORDER — PROSIGHT PO TABS
1.0000 | ORAL_TABLET | Freq: Every day | ORAL | Status: DC
  Filled 2022-03-20: qty 1

## 2022-03-20 NOTE — ED Notes (Signed)
Pt transported to CT via W/C. 

## 2022-03-20 NOTE — Assessment & Plan Note (Signed)
  #)   Hypokalemia: In the context of a history of recurrent hypokalemia, presenting serum potassium level noted to be 2.5.  Suspect contribution from suboptimal nutrition as a consequence of chronic alcohol abuse.  Of note, she is on potassium chloride 20 mill equivalents p.o. twice daily as an outpatient, acknowledges suboptimal compliance with this medication.  No reported recent vomiting or diarrhea.  In the ED this evening she has received 40 mill equivalents of oral potassium chloride as well as 20 mill equivalents of IV potassium chloride.  Will provide additional potassium supplementation as well as optimize serum magnesium level in the setting of presenting serum magnesium level of 1.8, as further detailed below.   Plan: Monitor on telemetry.  Potassium chloride 20 mill colons p.o. x1 additional dose now followed by resumption of home potassium Khary 20 mg p.o. twice daily.  Magnesium sulfate 2 g IV every 2 hours x1 dose now.  Repeat CMP and serum magnesium levels in the morning.

## 2022-03-20 NOTE — Assessment & Plan Note (Signed)
#)   Ground level mechanical fall: it appears that the patient experienced a ground level fall that appears to be mechanical in nature, with patient conveying that they tripped while ambulating at bar earlier today, with predisposing factors that include acute alcohol intoxication, asthma. Not a/w any preceding or resultant loss of consciousness. Not a/w any presyncope or chest pain.  She did hit her head as a component of this fall, with resultant small forehead laceration status postrepair in the ED with ensuing hemostasis noted.  Not on any blood thinners.  CT head showed no evidence of acute intracranial process, CT maxillofacial showed no evidence of acute maxillofacial fracture. Denies any significant ensuing acute arthralgias or myalgias.  Does not appear to be on any additional central acting medications as an outpatient to offer further predisposing contribution.    Plan: Fall precautions. Repeat CMP and CBC with differential in the morning.  Further evaluation management of presenting acute alcohol intoxication superimposed on chronic alcohol abuse, as further detailed above.  Thiamine 100 mg IV x1 now followed by daily thiamine, folic acid and multivitamin supplementation, as above.  Prn Norco for residual discomfort as relates to forehead laceration.

## 2022-03-20 NOTE — ED Triage Notes (Signed)
Pt BIB [facility] from bar where she fell and hit her head on the corner of the bar. Pt with laceration to forehead, hemostatic at time of triage. Pt intoxicated during triage. Pt combative with EMS, lethargic on arrival to ED.

## 2022-03-20 NOTE — ED Provider Notes (Signed)
[facility]-EMERGENCY DEPT Provider Note   CSN: [id number] Arrival date & time: 03/20/22  1441     History {Add pertinent medical, surgical, social history, OB history to HPI:1} Chief Complaint  Patient presents with  . Fall  . Alcohol Intoxication    Alexandra Dorsey is a 46 y.o. female past medical history significant for hypertension, alcohol dependence and abuse who presents via EMS from bar where she was drinking.  Patient had a mechanical fall due to intoxication and hit her head on the corner of the bar.  She has a laceration to the left eyebrow.  She was combative with EMS, lethargic on arrival to the ED.  On my assessment patient is still clinically intoxicated, but responding in complete sentences.  Patient endorses drinking 1/5 of liquor today.  She reports she remembers the fall.  Patient denies any numbness, tingling, or pain other than the head.  She reports she does not feel very good.   Fall  Alcohol Intoxication      Home Medications Prior to Admission medications   Medication Sig Start Date End Date Taking? Authorizing Provider  acetaminophen (TYLENOL) 325 MG tablet Take 2 tablets (650 mg total) by mouth every 6 (six) hours as needed for mild pain (or Fever >/= 101). 04/27/21   Rodolph Bonghompson, Daniel V, MD  amLODipine (NORVASC) 5 MG tablet Take 1 tablet (5 mg total) by mouth daily with lunch. 05/19/21 06/18/21  Gerhard MunchLockwood, Robert, MD  benzonatate (TESSALON) 200 MG capsule Take 1 capsule (200 mg total) by mouth every 8 (eight) hours as needed for cough. 04/27/21   Rodolph Bonghompson, Daniel V, MD  calcium-vitamin D (OSCAL WITH D) 500-200 MG-UNIT tablet Take 2 tablets by mouth 3 (three) times daily for 10 days. 04/27/21 05/07/21  Rodolph Bonghompson, Daniel V, MD  chlordiazePOXIDE (LIBRIUM) 25 MG capsule 50mg  PO TID x 1D, then 25-50mg  PO BID X 1D, then 25-50mg  PO QD X 1D 11/30/21   Pricilla LovelessGoldston, Scott, MD  dicyclomine (BENTYL) 20 MG tablet Take 20-40 mg by mouth daily as needed (stomach  cramps/diarrhea).    [provider]  folic acid (FOLVITE) 1 MG tablet Take 1 tablet (1 mg total) by mouth daily. 04/28/21   Rodolph Bonghompson, Daniel V, MD  HYDROcodone-acetaminophen (NORCO/VICODIN) 5-325 MG tablet Take 1 tablet by mouth every 6 (six) hours as needed for severe pain. 05/19/21   Gerhard MunchLockwood, Robert, MD  hydrOXYzine (ATARAX/VISTARIL) 25 MG tablet Take 1 tablet (25 mg total) by mouth 3 (three) times daily as needed for anxiety. 04/27/21   Rodolph Bonghompson, Daniel V, MD  loperamide (IMODIUM) 2 MG capsule Take 1 capsule (2 mg total) by mouth 4 (four) times daily as needed for diarrhea or loose stools. 11/30/21   Pricilla LovelessGoldston, Scott, MD  Multiple Vitamins-Minerals (ADULT ONE DAILY GUMMIES) CHEW Chew 1 tablet by mouth daily.    [provider]  ondansetron (ZOFRAN-ODT) 4 MG disintegrating tablet Take 1 tablet (4 mg total) by mouth every 8 (eight) hours as needed for nausea or vomiting. 11/30/21   Pricilla LovelessGoldston, Scott, MD  oxymetazoline (AFRIN) 0.05 % nasal spray Place 1 spray into both nostrils at bedtime as needed for congestion.    [provider]  potassium chloride SA (KLOR-CON M) 20 MEQ tablet Take 1 tablet (20 mEq total) by mouth 2 (two) times daily for 3 days. 11/30/21 12/03/21  Pricilla LovelessGoldston, Scott, MD  thiamine 100 MG tablet Take 1 tablet (100 mg total) by mouth daily. 04/28/21   Rodolph Bonghompson, Daniel V, MD  Allergies    Lisinopril and Norvasc [amlodipine besylate]    Review of Systems   Review of Systems  Skin:  Positive for wound.  All other systems reviewed and are negative.  Physical Exam Updated Vital Signs BP (!) 132/97 (BP Location: Left Arm)   Pulse 83   Temp (!) 97.4 F (36.3 C) (Axillary)   Resp 12   SpO2 99%  Physical Exam Vitals and nursing note reviewed.  Constitutional:      General: She is not in acute distress.    Appearance: Normal appearance.  HENT:     Head: Normocephalic and atraumatic.  Eyes:     General:        Right eye: No discharge.        Left eye:  No discharge.  Cardiovascular:     Rate and Rhythm: Normal rate and regular rhythm.     Heart sounds: No murmur heard.   No friction rub. No gallop.  Pulmonary:     Effort: Pulmonary effort is normal.     Breath sounds: Normal breath sounds.  Abdominal:     General: Bowel sounds are normal.     Palpations: Abdomen is soft.  Skin:    General: Skin is warm and dry.     Capillary Refill: Capillary refill takes less than 2 seconds.     Comments: Laceration noted above the left eyebrow which is vertically oriented.  Not bleeding at time my evaluation.  No other wounds noted.  Neurological:     Mental Status: She is alert and oriented to person, place, and time.     Comments: Intact strength 5 out of 5 bilateral upper and lower extremities.  Normal coordination.  Patient can ambulate prior to discharge.  Psychiatric:        Mood and Affect: Mood normal.        Behavior: Behavior normal.     Comments: Patient is clinically intoxicated with slurred speech    ED Results / Procedures / Treatments   Labs (all labs ordered are listed, but only abnormal results are displayed) Labs Reviewed  CBC  BASIC METABOLIC PANEL  ETHANOL  I-STAT BETA HCG BLOOD, ED ([facility], [facility], [facility] ONLY)    EKG None  Radiology No results found.  Procedures Procedures  {Document cardiac monitor, telemetry assessment procedure when appropriate:1}  Medications Ordered in ED Medications  lidocaine-EPINEPHrine (XYLOCAINE W/EPI) 2 %-1:200000 (PF) injection 20 mL (has no administration in time range)  Tdap (BOOSTRIX) injection 0.5 mL (has no administration in time range)    ED Course/ Medical Decision Making/ A&P Clinical Course as of 03/20/22 2122  Wynelle Link Mar 20, 2022  1734 On reevaluation patient is still clinically quite intoxicated and combative, I do not feel comfortable with laceration repair at this time until she is more sober.  She has fairly significant hypokalemia with potassium of 2.5.  No significant EKG  changes noted.  We will orally and IV replete.  Patient is combative and threatening to walk out would be reasonable to give her a small dose of Ativan to help calm her nerves while she waits to sober up.  We will also rehydrate. [CP]  2108 3.2cm lac, 3 stitches, 5-7 suture removal [CP]    Clinical Course User Index [CP] Olene Floss, PA-C                           Medical Decision Making Amount and/or Complexity of Data  Reviewed Labs: ordered. Radiology: ordered.  Risk Prescription drug management.   This patient is a 46 y.o. female who presents to the ED for concern of acute intoxication, fall, forehead injury, this involves an extensive number of treatment options, and is a complaint that carries with it a high risk of complications and morbidity. The emergent differential diagnosis prior to evaluation includes, but is not limited to, intracranial head injury, cervical spine fracture, acute intoxication, liver injury, electrolyte abnormalities, dehydration versus other.   This is not an exhaustive differential.   Past Medical History / Co-morbidities / Social History: Previous history of alcohol abuse, chronic hypokalemia  Additional history: Chart reviewed. Pertinent results include: Reviewed lab work, imaging from previous emergency department visits for acute intoxication.  Patient had failed Librium taper.  Physical Exam: Physical exam performed. The pertinent findings include: Patient with no midline spine tenderness, intact range of motion of neck.  Despite the distracting injury of fall, her head injury was frontal, I have low clinical concern for acute cervical spine injury at this time.  Patient continues to show no signs of neck pain, or deformity after she has sobered up considerably.  She does have a laceration tenderness palpation medial left eyebrow.  Pupils are equal round reactive to light and she can move all limbs spontaneously.  Lab Tests: I ordered, and  personally interpreted labs.  The pertinent results include: Patient with ethanol level of 431 on arrival.  Considering her clinical appearance of extreme intoxication, combative behavior with police this seems consistent with her clinical condition.  CMP is significant for significant hypokalemia, potassium 2.5.  We will orally and IV replete.  She has no clinically significant leukocytosis.  She does have a slight microcytic quality to her blood count despite no anemia, I do have some concern that she may have B12/folate deficiency.  Encouraged oral repletion, follow-up with PCP.   Imaging Studies: I ordered imaging studies including CT head without contrast, CT maxillofacial without contrast. I independently visualized and interpreted imaging which showed no evidence of fracture, acute intracranial abnormality. I agree with the radiologist interpretation.   Cardiac Monitoring:  The patient was maintained on a cardiac monitor.  My attending physician Dr. Dalene Seltzer viewed and interpreted the cardiac monitored which showed an underlying rhythm of: Normal sinus rhythm, patient with new prolonged PR interval without any missed beats since her last EKG.  She also has borderline prolonged QT.. I agree with this interpretation.  In context of significant hypokalemia with EKG changes think would be reasonable to admit patient for observation and continued potassium.   Medications: I ordered medication including Ativan for agitation, potassium for hypokalemia, tetanus for laceration, Zofran for nausea, fluids for intoxication, clinical dehydration. Reevaluation of the patient after these medicines showed that the patient improved. I have reviewed the patients home medicines and have made adjustments as needed.  Consults: I spoke with the hospitalist, Dr. Marilynn Rail regarding patient's presentation, given her significant hypokalemia, new EKG changes recommend admission at this time.  Disposition: After  consideration of the diagnostic results and the patients response to treatment, I feel that patient requires admission for hypokalemia.   I discussed this case with my attending physician Dr. Dalene Seltzer who cosigned this note including patient's presenting symptoms, physical exam, and planned diagnostics and interventions. Attending physician stated agreement with plan or made changes to plan which were implemented.    Final Clinical Impression(s) / ED Diagnoses Final diagnoses:  None    Rx / DC Orders ED  Discharge Orders     None

## 2022-03-20 NOTE — Assessment & Plan Note (Signed)
  #)   Generalized anxiety disorder: Documented history of such, on as needed hydroxyzine as an outpatient.  Does not appear to be on any scheduled antidepressant medication.  Prolonged QTc interval noted on presenting EKG, as above.   Plan: Prn IV Ativan.  Further evaluation management of prolonged QTc, including repeat EKG in the morning, as above.

## 2022-03-20 NOTE — Assessment & Plan Note (Signed)
  #)   Essential Hypertension: documented h/o such, with outpatient antihypertensive regimen including amlodipine.  SBP's in the ED today: Normotensive.   Plan: Close monitoring of subsequent BP via routine VS. resume home amlodipine.

## 2022-03-20 NOTE — H&P (Signed)
History and Physical    PLEASE NOTE THAT DRAGON DICTATION SOFTWARE WAS USED IN THE CONSTRUCTION OF THIS NOTE.   Alexandra Dorsey [id number]:[id number] DOB: [birth year] DOA: 03/20/2022  PCP: Darrin Nipperollege, [facility] [place]  Patient coming from: home   I have personally briefly reviewed patient's old medical [facility]  Chief Complaint: fall  HPI: Alexandra Dorsey is a 46 y.o. female with medical history significant for essential hypertension, generalized anxiety disorder, chronic alcohol abuse, who is admitted to [facility] on 03/20/2022 with hypokalemia after [facility] complaining of [facility].   The following history is provided by the patient, patient's friend who is present bedside, my discussions with the EDP, and via chart review.  Patient presents to ED via EMS for evaluation of fall x1 that occurred while she was ambulating at a local bar earlier today, in which room believes that the patient tripped while ambulating.  This resulted in a ground-level fall in which the patient struck her head on the floor, with head reportedly the result point of contact with the floor.  No associated loss of consciousness.  Patient denies any associated, recently preceding, or ensuing chest pain, shortness of breath, dizziness, diaphoresis, nausea, vomiting, presyncope, or syncope.  She reports mild residual pain along her forehead status post ensuing lack repair and was evaluated emergency department this evening.  Denies any associated neck pain, nor any other significant acute arthralgias or myalgias.   Patient with a documented history of chronic alcohol abuse, currently consuming an average of 10 beers per day, and has been consuming alcohol at this volume and frequency for over a year.  She reportedly consumed 1/5 of liquor earlier today prior to going to a local park at which time a friend to visit the patient consumed 3 additional shots of liquor leading  up to the aforementioned ground-level mechanical fall.  No use of recreational drugs.   Per chart review, patient is diagnosed history of intermittent hypokalemia, and is currently prescribed oral potassium supplementation as outpatient, with which she reports suboptimal compliance.  Denies any recent nausea, vomiting, diarrhea, abdominal pain.   In the setting of the aforementioned, ground-level fall at the park earlier today, the patient was subsequent brought [facility] via EMS for further evaluation and management thereof.     ED Course:  Vital signs in the ED were notable for the following: Afebrile; heart rate 64-99; blood pressure 100/75 -132/97; respiratory 15-16, oxygen saturation 95 to 100% on room air.  Labs were notable for the following: CMP notable for the following: Potassium 2.5, creatinine 0.61, glucose 110, calcium, corrected for mild hypoalbuminemia noted to be 8.7, albumin 3.5, otherwise liver enzymes within normal limits.  Serum magnesium level 1.8.  CBC notable for episode count 5000, hemoglobin 12.2, platelet count 284.  Serum ethanol level 431.  Imaging and additional notable ED work-up: EKG demonstrated sinus rhythm with first-degree AV block, heart rate 75, QTc 503, nonspecific T wave inversion in aVL and V2, and no evidence of ST changes, including no evidence of ST elevation.  Noncontrast CT head showed no evidence of acute intracranial process, clear no evidence of intracranial hemorrhage or any evidence of acute infarct, while CT maxillofacial showed no evidence of acute maxillofacial fracture.  Small laceration of forehead was repaired via EDP, and while in the ED, the following were administered: Ativan 1 mg IV x1, morphine 2 mg IV x1, potassium chloride 10  mEq p.o. x1, potassium chloride 20 mill colons IV over 2 hours x 1 dose, normal saline x1 L and Tdap x1.  Subsequently, the patient was admitted for overnight observation for further  evaluation management of presenting hypokalemia in the setting of presenting ground-level mechanical fall with acute alcohol intoxication, with presentation also notable for QTc prolongation.    Review of Systems: As per HPI otherwise 10 point review of systems negative.   Past Medical History:  Diagnosis Date   Anxiety    Hypertension     History reviewed. No pertinent surgical history.  Social History:  reports that she has quit smoking. She has never used smokeless tobacco. She reports current alcohol use of about 10.0 standard drinks per week. She reports that she does not use drugs.   Allergies  Allergen Reactions   Lisinopril Cough   Norvasc [Amlodipine Besylate] Other (See Comments)    Feet hurts    Family History  Problem Relation Age of Onset   Hypertension Mother    Hypertension Father    Stroke Maternal Grandfather     Family history reviewed and not pertinent    Prior to Admission medications   Medication Sig Start Date End Date Taking? Authorizing Provider  amLODipine (NORVASC) 5 MG tablet Take 1 tablet (5 mg total) by mouth daily with lunch. 05/19/21 03/20/22 Yes Gerhard Munch, MD  chlordiazePOXIDE (LIBRIUM) 25 MG capsule  PO TID x 1D, then 25-50mg  PO BID X 1D, then 25-50mg  PO QD X 1D 11/30/21  Yes Pricilla Loveless, MD  ondansetron (ZOFRAN-ODT) 4 MG disintegrating tablet Take 1 tablet (4 mg total) by mouth every 8 (eight) hours as needed for nausea or vomiting. 11/30/21  Yes Pricilla Loveless, MD  potassium chloride SA (KLOR-CON M) 20 MEQ tablet Take 1 tablet (20 mEq total) by mouth 2 (two) times daily for 3 days. 11/30/21 03/20/22 Yes Pricilla Loveless, MD  acetaminophen (TYLENOL) 325 MG tablet Take 2 tablets (650 mg total) by mouth every 6 (six) hours as needed for mild pain (or Fever >/= 101). 04/27/21   Rodolph Bong, MD  benzonatate (TESSALON) 200 MG capsule Take 1 capsule (200 mg total) by mouth every 8 (eight) hours as needed for cough. 04/27/21   Rodolph Bong, MD  calcium-vitamin D (OSCAL WITH D) 500-200 MG-UNIT tablet Take 2 tablets by mouth 3 (three) times daily for 10 days. 04/27/21 03/20/22  Rodolph Bong, MD  folic acid (FOLVITE) 1 MG tablet Take 1 tablet (1 mg total) by mouth daily. 04/28/21   Rodolph Bong, MD  HYDROcodone-acetaminophen (NORCO/VICODIN) 5-325 MG tablet Take 1 tablet by mouth every 6 (six) hours as needed for severe pain. 05/19/21   Gerhard Munch, MD  hydrOXYzine (ATARAX/VISTARIL) 25 MG tablet Take 1 tablet (25 mg total) by mouth 3 (three) times daily as needed for anxiety. 04/27/21   Rodolph Bong, MD  loperamide (IMODIUM) 2 MG capsule Take 1 capsule (2 mg total) by mouth 4 (four) times daily as needed for diarrhea or loose stools. 11/30/21   Pricilla Loveless, MD  Multiple Vitamins-Minerals (ADULT ONE DAILY GUMMIES) CHEW Chew 1 tablet by mouth daily.    [provider]  thiamine 100 MG tablet Take 1 tablet (100 mg total) by mouth daily. Patient not taking: Reported on 03/20/2022 04/28/21   Rodolph Bong, MD     Objective    Physical Exam: Vitals:   03/20/22 2030 03/20/22 2115 03/20/22 2130 03/20/22 2145  BP: 116/83 (!) 114/94 103/88  114/88  Pulse: 81 74 71 87  Resp: 16 16  11   Temp:      TempSrc:      SpO2: 91% 100% 100% 100%    General: appears to be stated age; alert, oriented Skin: warm, dry, no rash Head:  Al[place]ta; small forehead lac status post repair with hemostasis noted Mouth:  Oral mucosa membranes appear dry, normal dentition Neck: supple; trachea midline Heart:  RRR; did not appreciate any M/R/G Lungs: CTAB, did not appreciate any wheezes, rales, or rhonchi Abdomen: + BS; soft, ND, NT Vascular: 2+ pedal pulses b/l; 2+ radial pulses b/l Extremities: no peripheral edema, no muscle wasting Neuro: strength and sensation intact in upper and lower extremities b/l    Labs on Admission: I have personally reviewed following labs and imaging studies  CBC: Recent Labs  Lab  03/20/22 1629  WBC 5.0  HGB 12.2  HCT 35.0*  MCV 100.3*  PLT 284   Basic Metabolic Panel: Recent Labs  Lab 03/20/22 1629 03/20/22 1745  NA 141  --   K 2.5*  --   CL 95*  --   CO2 34*  --   GLUCOSE 110*  --   BUN 8  --   CREATININE 0.61  --   CALCIUM 8.3*  --   MG  --  1.8   GFR: CrCl cannot be calculated (Unknown ideal weight.). Liver Function Tests: Recent Labs  Lab 03/20/22 1629  AST 41  ALT 11  ALKPHOS 53  BILITOT 0.5  PROT 6.3*  ALBUMIN 3.5   No results for input(s): LIPASE, AMYLASE in the last 168 hours. No results for input(s): AMMONIA in the last 168 hours. Coagulation Profile: No results for input(s): INR, PROTIME in the last 168 hours. Cardiac Enzymes: No results for input(s): CKTOTAL, CKMB, CKMBINDEX, TROPONINI in the last 168 hours. BNP (last 3 results) No results for input(s): PROBNP in the last 8760 hours. HbA1C: No results for input(s): HGBA1C in the last 72 hours. CBG: No results for input(s): GLUCAP in the last 168 hours. Lipid Profile: No results for input(s): CHOL, HDL, LDLCALC, TRIG, CHOLHDL, LDLDIRECT in the last 72 hours. Thyroid Function Tests: No results for input(s): TSH, T4TOTAL, FREET4, T3FREE, THYROIDAB in the last 72 hours. Anemia Panel: No results for input(s): VITAMINB12, FOLATE, FERRITIN, TIBC, IRON, RETICCTPCT in the last 72 hours. Urine analysis:    Component Value Date/Time   COLORURINE AMBER (A) 05/19/2021 1030   APPEARANCEUR CLEAR 05/19/2021 1030   LABSPEC 1.018 05/19/2021 1030   PHURINE 5.0 05/19/2021 1030   GLUCOSEU NEGATIVE 05/19/2021 1030   HGBUR NEGATIVE 05/19/2021 1030   BILIRUBINUR NEGATIVE 05/19/2021 1030   BILIRUBINUR negative 09/01/2017 1514   BILIRUBINUR neg 02/05/2015 1111   KETONESUR NEGATIVE 05/19/2021 1030   PROTEINUR 30 (A) 05/19/2021 1030   UROBILINOGEN negative (A) 09/01/2017 1514   UROBILINOGEN 0.2 01/02/2014 0027   NITRITE POSITIVE (A) 05/19/2021 1030   LEUKOCYTESUR NEGATIVE 05/19/2021 1030     Radiological Exams on Admission: CT Head Wo Contrast  Result Date: 03/20/2022 CLINICAL DATA:  Facial trauma, blunt; Head trauma, moderate-severe EXAM: CT HEAD WITHOUT CONTRAST CT MAXILLOFACIAL WITHOUT CONTRAST TECHNIQUE: Multidetector CT imaging of the head and maxillofacial structures were performed using the standard protocol without intravenous contrast. Multiplanar CT image reconstructions of the maxillofacial structures were also generated. RADIATION DOSE REDUCTION: This exam was performed according to the departmental dose-optimization program which includes automated exposure control, adjustment of the mA and/or kV according to patient size and/or use  of iterative reconstruction technique. COMPARISON:  06/06/2017 FINDINGS: CT HEAD FINDINGS Brain: No evidence of acute infarction, hemorrhage, hydrocephalus, extra-axial collection or mass lesion/mass effect. Mild cerebral volume loss, slightly advanced for age. Vascular: No hyperdense vessel or unexpected calcification. Skull: Normal. Negative for fracture or focal lesion. Other: Mild soft tissue swelling in the lower left frontal region. No well-defined scalp hematoma. CT MAXILLOFACIAL FINDINGS Osseous: No acute maxillofacial bone fracture. Bony orbital walls are intact. Mandible intact. Temporomandibular joints are aligned without dislocation. Orbits: Negative. No traumatic or inflammatory finding. Sinuses: Clear. Soft tissues: Left supraorbital soft tissue swelling with subtle irregularity compatible with reported laceration. Otherwise negative. IMPRESSION: 1. No acute intracranial findings. 2. No acute maxillofacial bone fracture. 3. Left supraorbital soft tissue swelling with superficial laceration. Electronically Signed   By: Duanne Guess D.O.   On: 03/20/2022 15:58   CT Maxillofacial Wo Contrast  Result Date: 03/20/2022 CLINICAL DATA:  Facial trauma, blunt; Head trauma, moderate-severe EXAM: CT HEAD WITHOUT CONTRAST CT MAXILLOFACIAL WITHOUT  CONTRAST TECHNIQUE: Multidetector CT imaging of the head and maxillofacial structures were performed using the standard protocol without intravenous contrast. Multiplanar CT image reconstructions of the maxillofacial structures were also generated. RADIATION DOSE REDUCTION: This exam was performed according to the departmental dose-optimization program which includes automated exposure control, adjustment of the mA and/or kV according to patient size and/or use of iterative reconstruction technique. COMPARISON:  06/06/2017 FINDINGS: CT HEAD FINDINGS Brain: No evidence of acute infarction, hemorrhage, hydrocephalus, extra-axial collection or mass lesion/mass effect. Mild cerebral volume loss, slightly advanced for age. Vascular: No hyperdense vessel or unexpected calcification. Skull: Normal. Negative for fracture or focal lesion. Other: Mild soft tissue swelling in the lower left frontal region. No well-defined scalp hematoma. CT MAXILLOFACIAL FINDINGS Osseous: No acute maxillofacial bone fracture. Bony orbital walls are intact. Mandible intact. Temporomandibular joints are aligned without dislocation. Orbits: Negative. No traumatic or inflammatory finding. Sinuses: Clear. Soft tissues: Left supraorbital soft tissue swelling with subtle irregularity compatible with reported laceration. Otherwise negative. IMPRESSION: 1. No acute intracranial findings. 2. No acute maxillofacial bone fracture. 3. Left supraorbital soft tissue swelling with superficial laceration. Electronically Signed   By: Duanne Guess D.O.   On: 03/20/2022 15:58     EKG: Independently reviewed, with result as described above.    Assessment/Plan    Principal Problem:   Hypokalemia Active Problems:   Hypertension   Prolonged QT interval   Acute alcohol intoxication (HCC)   Fall at home, initial encounter   Anxiety      #) Hypokalemia: In the context of a history of recurrent hypokalemia, presenting serum potassium level noted  to be 2.5.  Suspect contribution from suboptimal nutrition as a consequence of chronic alcohol abuse.  Of note, she is on potassium chloride 20 mill equivalents p.o. twice daily as an outpatient, acknowledges suboptimal compliance with this medication.  No reported recent vomiting or diarrhea.  In the ED this evening she has received 40 mill equivalents of oral potassium chloride as well as 20 mill equivalents of IV potassium chloride.  Will provide additional potassium supplementation as well as optimize serum magnesium level in the setting of presenting serum magnesium level of 1.8, as further detailed below.   Plan: Monitor on telemetry.  Potassium chloride 20 mill colons p.o. x1 additional dose now followed by resumption of home potassium Khary 20 mg p.o. twice daily.  Magnesium sulfate 2 g IV every 2 hours x1 dose now.  Repeat CMP and serum magnesium levels in the  morning.           #) QTc prolongation: Presenting EKG demonstrates QTc of 503 ms. no overt outpatient pharmacologic contributions.  While she is noted to have a diagnosis of depression, denies use of any antidepressant medications that may be contributing to this EKG finding.  Presenting serum magnesium level noted to be 1.8. will optimize serum magnesium level and repeat EKG, as further detailed below.   Plan: Monitor on telemetry.  Magnesium sulfate 2 g IV over 2 hours x 1 dose now.  Repeat serum magnesium level in the morning repeat EKG in the morning to monitor interval degree of QTc prolongation.          #) Acute alcohol intoxication: In the setting of chronic Alcohol Abuse in which the patient reports typical daily alcohol consumption of approximately 10 beers per day, which that degree of typical daily alcohol consumption  occurring for over 1 year, she presents with acute alcohol intoxication, serum ethanol level of 431 and reportedly consuming 1/5 of liquor followed by 3 additional shots of liquor earlier today.  At  the time of my encounter with her, she is awake alert and oriented, without any evidence of respiratory depression.  Given that the above volume of alcohol was consumed within the last 12 hours, it is less likely that she will experience significant alcohol withdrawal overnight.  Consequently, will refrain from overt initiation of CIWA for now, with potential reconsideration if the patient's hospitalization extends beyond anticipated overnight observation.  Close monitoring for development of evidence of alcohol withdrawal, including close attention to trend in vital signs, as well as close monitoring of electrolytes, as described below.    Plan: Thiamine 100 mg IV x1 dose now followed by daily oral thiamine, folic acid and multivitamin supplementation to start tomorrow morning. counseled the patient on the importance of reduction in alcohol consumption. Consult to transition of care team placed. Close monitoring of ensuing BP and HR via routine VS. Telemetry.  Further evaluation management of presenting hypokalemia, as further detailed above .  Check serum phosphorus level. Repeat CMP and serum magnesium level in the morning. Check INR.  Continuous NS.         #) Ground level mechanical fall: it appears that the patient experienced a ground level fall that appears to be mechanical in nature, with patient conveying that they tripped while ambulating at bar earlier today, with predisposing factors that include acute alcohol intoxication, asthma. Not a/w any preceding or resultant loss of consciousness. Not a/w any presyncope or chest pain.  She did hit her head as a component of this fall, with resultant small forehead laceration status postrepair in the ED with ensuing hemostasis noted.  Not on any blood thinners.  CT head showed no evidence of acute intracranial process, CT maxillofacial showed no evidence of acute maxillofacial fracture. Denies any significant ensuing acute arthralgias or myalgias.   Does not appear to be on any additional central acting medications as an outpatient to offer further predisposing contribution.    Plan: Fall precautions. Repeat CMP and CBC with differential in the morning.  Further evaluation management of presenting acute alcohol intoxication superimposed on chronic alcohol abuse, as further detailed above.  Thiamine 100 mg IV x1 now followed by daily thiamine, folic acid and multivitamin supplementation, as above.  Prn Norco for residual discomfort as relates to forehead laceration.          #) Essential Hypertension: documented h/o such, with outpatient antihypertensive regimen including  amlodipine.  SBP's in the ED today: Normotensive.   Plan: Close monitoring of subsequent BP via routine VS. resume home amlodipine.         #) Generalized anxiety disorder: Documented history of such, on as needed hydroxyzine as an outpatient.  Does not appear to be on any scheduled antidepressant medication.  Prolonged QTc interval noted on presenting EKG, as above.   Plan: Prn IV Ativan.  Further evaluation management of prolonged QTc, including repeat EKG in the morning, as above.       DVT prophylaxis: SCD's   Code Status: Full code Family Communication: none Disposition Plan: Per Rounding Team Consults called: Transition of care team consult placed, as further detailed above;  Admission status: Observation   PLEASE NOTE THAT DRAGON DICTATION SOFTWARE WAS USED IN THE CONSTRUCTION OF THIS NOTE.   Chaney Born Devynn Hessler DO [facility] From 7PM - 7AM   03/20/2022, 10:36 PM

## 2022-03-20 NOTE — ED Notes (Signed)
Pt. Removed IV, RN made aware.

## 2022-03-20 NOTE — Assessment & Plan Note (Signed)
  #)   QTc prolongation: Presenting EKG demonstrates QTc of 503 ms. no overt outpatient pharmacologic contributions.  While she is noted to have a diagnosis of depression, denies use of any antidepressant medications that may be contributing to this EKG finding.  Presenting serum magnesium level noted to be 1.8. will optimize serum magnesium level and repeat EKG, as further detailed below.   Plan: Monitor on telemetry.  Magnesium sulfate 2 g IV over 2 hours x 1 dose now.  Repeat serum magnesium level in the morning repeat EKG in the morning to monitor interval degree of QTc prolongation.

## 2022-03-20 NOTE — Assessment & Plan Note (Signed)
 #)   Acute alcohol intoxication: In the setting of chronic Alcohol Abuse in which the patient reports typical daily alcohol consumption of approximately 10 beers per day, which that degree of typical daily alcohol consumption  occurring for over 1 year, she presents with acute alcohol intoxication, serum ethanol level of 431 and reportedly consuming 1/5 of liquor followed by 3 additional shots of liquor earlier today.  At the time of my encounter with her, she is awake alert and oriented, without any evidence of respiratory depression.  Given that the above volume of alcohol was consumed within the last 12 hours, it is less likely that she will experience significant alcohol withdrawal overnight.  Consequently, will refrain from overt initiation of CIWA for now, with potential reconsideration if the patient's hospitalization extends beyond anticipated overnight observation.  Close monitoring for development of evidence of alcohol withdrawal, including close attention to trend in vital signs, as well as close monitoring of electrolytes, as described below.    Plan: Thiamine 100 mg IV x1 dose now followed by daily oral thiamine, folic acid and multivitamin supplementation to start tomorrow morning. counseled the patient on the importance of reduction in alcohol consumption. Consult to transition of care team placed. Close monitoring of ensuing BP and HR via routine VS. Telemetry.  Further evaluation management of presenting hypokalemia, as further detailed above .  Check serum phosphorus level. Repeat CMP and serum magnesium level in the morning. Check INR.  Continuous NS.

## 2022-03-21 DIAGNOSIS — F10920 Alcohol use, unspecified with intoxication, uncomplicated: Secondary | ICD-10-CM

## 2022-03-21 DIAGNOSIS — W19XXXA Unspecified fall, initial encounter: Secondary | ICD-10-CM

## 2022-03-21 DIAGNOSIS — Y92009 Unspecified place in unspecified non-institutional (private) residence as the place of occurrence of the external cause: Secondary | ICD-10-CM

## 2022-03-21 DIAGNOSIS — E876 Hypokalemia: Secondary | ICD-10-CM | POA: Diagnosis not present

## 2022-03-21 LAB — PROTIME-INR
INR: 1.1 (ref 0.8–1.2)
Prothrombin Time: 13.7 seconds (ref 11.4–15.2)

## 2022-03-21 LAB — CBC WITH DIFFERENTIAL/PLATELET
Abs Immature Granulocytes: 0.01 10*3/uL (ref 0.00–0.07)
Basophils Absolute: 0 10*3/uL (ref 0.0–0.1)
Basophils Relative: 1 %
Eosinophils Absolute: 0 10*3/uL (ref 0.0–0.5)
Eosinophils Relative: 1 %
HCT: 34 % — ABNORMAL LOW (ref 36.0–46.0)
Hemoglobin: 11.5 g/dL — ABNORMAL LOW (ref 12.0–15.0)
Immature Granulocytes: 0 %
Lymphocytes Relative: 44 %
Lymphs Abs: 1.8 10*3/uL (ref 0.7–4.0)
MCH: 34.4 pg — ABNORMAL HIGH (ref 26.0–34.0)
MCHC: 33.8 g/dL (ref 30.0–36.0)
MCV: 101.8 fL — ABNORMAL HIGH (ref 80.0–100.0)
Monocytes Absolute: 0.5 10*3/uL (ref 0.1–1.0)
Monocytes Relative: 12 %
Neutro Abs: 1.8 10*3/uL (ref 1.7–7.7)
Neutrophils Relative %: 42 %
Platelets: 269 10*3/uL (ref 150–400)
RBC: 3.34 MIL/uL — ABNORMAL LOW (ref 3.87–5.11)
RDW: 14.5 % (ref 11.5–15.5)
WBC: 4.1 10*3/uL (ref 4.0–10.5)
nRBC: 0 % (ref 0.0–0.2)

## 2022-03-21 LAB — COMPREHENSIVE METABOLIC PANEL
ALT: 10 U/L (ref 0–44)
AST: 31 U/L (ref 15–41)
Albumin: 2.8 g/dL — ABNORMAL LOW (ref 3.5–5.0)
Alkaline Phosphatase: 46 U/L (ref 38–126)
Anion gap: 7 (ref 5–15)
BUN: 7 mg/dL (ref 6–20)
CO2: 29 mmol/L (ref 22–32)
Calcium: 7.2 mg/dL — ABNORMAL LOW (ref 8.9–10.3)
Chloride: 107 mmol/L (ref 98–111)
Creatinine, Ser: 0.46 mg/dL (ref 0.44–1.00)
GFR, Estimated: >60 mL/min (ref >60)
Glucose, Bld: 77 mg/dL (ref 70–99)
Potassium: 3.7 mmol/L (ref 3.5–5.1)
Sodium: 143 mmol/L (ref 135–145)
Total Bilirubin: 0.6 mg/dL (ref 0.3–1.2)
Total Protein: 5.1 g/dL — ABNORMAL LOW (ref 6.5–8.1)

## 2022-03-21 LAB — PHOSPHORUS: Phosphorus: 2.5 mg/dL (ref 2.5–4.6)

## 2022-03-21 LAB — MAGNESIUM: Magnesium: 2.1 mg/dL (ref 1.7–2.4)

## 2022-03-21 MED ORDER — HYDROCODONE-ACETAMINOPHEN 5-325 MG PO TABS: 1.0000 | ORAL_TABLET | Freq: Four times a day (QID) | ORAL | 0 refills | Status: DC | PRN

## 2022-03-21 MED ORDER — FENTANYL CITRATE PF 50 MCG/ML IJ SOSY
25.0000 ug | PREFILLED_SYRINGE | Freq: Once | INTRAMUSCULAR | Status: AC
  Administered 2022-03-21: 25 ug via INTRAVENOUS
  Filled 2022-03-21: qty 1

## 2022-03-21 NOTE — Discharge Summary (Signed)
Physician Discharge Summary   Patient: Alexandra Dorsey MRN: [id number] DOB: [birth year]  Admit date:     03/20/2022  Discharge date: 03/21/22  Discharge Physician: Rickey Barbara   PCP: [facility] [place]   Recommendations at discharge:    Follow up with PCP in 1-2 weeks Strongly advise referral to alcohol program if patient would be willing. In hospital, patient remained in denial of her alcohol addiction  Discharge Diagnoses: Principal Problem:   Hypokalemia Active Problems:   Hypertension   Prolonged QT interval   Acute alcohol intoxication (HCC)   Fall at home, initial encounter   Anxiety  Resolved Problems:   * No resolved hospital problems. *  Hospital Course: 46 y.o. female with medical history significant for essential hypertension, generalized anxiety disorder, chronic alcohol abuse, who is admitted to [facility] on 03/20/2022 with hypokalemia after presenting from home to [facility] ED complaining of fall  Assessment and Plan: Hypokalemia -Likely secondary to ongoing alcohol abuse -Replaced -Mag level reviewed, within normal limits  Prolonged QTc -QTc noted to be over 500, likely secondary to electrolyte abnormalities per above -Potassium was corrected, repeat EKG reviewed.  QTc has improved -Patient remained stable this visit  Alcohol abuse -Patient chart reviewed.  History of alcohol abuse going back several years -Patient noted to have macrocytosis, suggesting chronic alcohol abuse -Patient remains in denial of her alcohol addiction -Recommend continue supportive care, follow-up with outpatient alcohol program -Patient otherwise alert, oriented, conversing appropriately at time of discharge  Anxiety -Recommend outpatient therapy  Fall -CT head reviewed, unremarkable for acute process  Hypertension -Blood pressure remained stable this visit      Consultants:  Procedures performed:   Disposition: Home Diet recommendation:   Regular diet DISCHARGE MEDICATION: Allergies as of 03/21/2022       Reactions   Lisinopril Cough   Norvasc [amlodipine Besylate] Other (See Comments)   Feet hurts        Medication List     STOP taking these medications    benzonatate 200 MG capsule Commonly known as: TESSALON   chlordiazePOXIDE 25 MG capsule Commonly known as: LIBRIUM   hydrOXYzine 25 MG tablet Commonly known as: ATARAX   loperamide 2 MG capsule Commonly known as: IMODIUM       TAKE these medications    acetaminophen 325 MG tablet Commonly known as: TYLENOL Take 2 tablets (650 mg total) by mouth every 6 (six) hours as needed for mild pain (or Fever >/= 101).   Adult One Daily Gummies Chew Chew 1 tablet by mouth daily.   amLODipine 5 MG tablet Commonly known as: NORVASC Take 1 tablet (5 mg total) by mouth daily with lunch.   calcium-vitamin D 500-200 MG-UNIT tablet Commonly known as: OSCAL WITH D Take 2 tablets by mouth 3 (three) times daily for 10 days.   folic acid 1 MG tablet Commonly known as: FOLVITE Take 1 tablet (1 mg total) by mouth daily.   HYDROcodone-acetaminophen 5-325 MG tablet Commonly known as: NORCO/VICODIN Take 1 tablet by mouth every 6 (six) hours as needed for severe pain.   ondansetron 4 MG disintegrating tablet Commonly known as: ZOFRAN-ODT Take 1 tablet (4 mg total) by mouth every 8 (eight) hours as needed for nausea or vomiting.   potassium chloride SA 20 MEQ tablet Commonly known as: KLOR-CON M Take 1 tablet (20 mEq total) by mouth 2 (two) times daily for 3 days.   thiamine 100 MG tablet Take 1 tablet (100 mg  total) by mouth daily.        Follow-up Information     [facility] [place] Follow up in 2 week(s).   Specialty: Family Medicine Why: Hospital follow up Contact information: [address] [place] [address] [phone]                Discharge Exam: There were no vitals filed for this visit. General  exam: Awake, laying in bed, in nad Respiratory system: Normal respiratory effort, no wheezing Cardiovascular system: regular rate, s1, s2 Gastrointestinal system: Soft, nondistended, positive BS Central nervous system: CN2-12 grossly intact, strength intact Extremities: Perfused, no clubbing Skin: Normal skin turgor, no notable skin lesions seen Psychiatry: Mood normal // no visual hallucinations   Condition at discharge: fair  The results of significant diagnostics from this hospitalization (including imaging, microbiology, ancillary and laboratory) are listed below for reference.   Imaging Studies: CT Head Wo Contrast  Result Date: 03/20/2022 CLINICAL DATA:  Facial trauma, blunt; Head trauma, moderate-severe EXAM: CT HEAD WITHOUT CONTRAST CT MAXILLOFACIAL WITHOUT CONTRAST TECHNIQUE: Multidetector CT imaging of the head and maxillofacial structures were performed using the standard protocol without intravenous contrast. Multiplanar CT image reconstructions of the maxillofacial structures were also generated. RADIATION DOSE REDUCTION: This exam was performed according to the departmental dose-optimization program which includes automated exposure control, adjustment of the mA and/or kV according to patient size and/or use of iterative reconstruction technique. COMPARISON:  06/06/2017 FINDINGS: CT HEAD FINDINGS Brain: No evidence of acute infarction, hemorrhage, hydrocephalus, extra-axial collection or mass lesion/mass effect. Mild cerebral volume loss, slightly advanced for age. Vascular: No hyperdense vessel or unexpected calcification. Skull: Normal. Negative for fracture or focal lesion. Other: Mild soft tissue swelling in the lower left frontal region. No well-defined scalp hematoma. CT MAXILLOFACIAL FINDINGS Osseous: No acute maxillofacial bone fracture. Bony orbital walls are intact. Mandible intact. Temporomandibular joints are aligned without dislocation. Orbits: Negative. No traumatic or  inflammatory finding. Sinuses: Clear. Soft tissues: Left supraorbital soft tissue swelling with subtle irregularity compatible with reported laceration. Otherwise negative. IMPRESSION: 1. No acute intracranial findings. 2. No acute maxillofacial bone fracture. 3. Left supraorbital soft tissue swelling with superficial laceration. Electronically Signed   By: Duanne Guess D.O.   On: 03/20/2022 15:58   CT Maxillofacial Wo Contrast  Result Date: 03/20/2022 CLINICAL DATA:  Facial trauma, blunt; Head trauma, moderate-severe EXAM: CT HEAD WITHOUT CONTRAST CT MAXILLOFACIAL WITHOUT CONTRAST TECHNIQUE: Multidetector CT imaging of the head and maxillofacial structures were performed using the standard protocol without intravenous contrast. Multiplanar CT image reconstructions of the maxillofacial structures were also generated. RADIATION DOSE REDUCTION: This exam was performed according to the departmental dose-optimization program which includes automated exposure control, adjustment of the mA and/or kV according to patient size and/or use of iterative reconstruction technique. COMPARISON:  06/06/2017 FINDINGS: CT HEAD FINDINGS Brain: No evidence of acute infarction, hemorrhage, hydrocephalus, extra-axial collection or mass lesion/mass effect. Mild cerebral volume loss, slightly advanced for age. Vascular: No hyperdense vessel or unexpected calcification. Skull: Normal. Negative for fracture or focal lesion. Other: Mild soft tissue swelling in the lower left frontal region. No well-defined scalp hematoma. CT MAXILLOFACIAL FINDINGS Osseous: No acute maxillofacial bone fracture. Bony orbital walls are intact. Mandible intact. Temporomandibular joints are aligned without dislocation. Orbits: Negative. No traumatic or inflammatory finding. Sinuses: Clear. Soft tissues: Left supraorbital soft tissue swelling with subtle irregularity compatible with reported laceration. Otherwise negative. IMPRESSION: 1. No acute  intracranial findings. 2. No acute maxillofacial bone  fracture. 3. Left supraorbital soft tissue swelling with superficial laceration. Electronically Signed   By: Duanne GuessNicholas  Plundo D.O.   On: 03/20/2022 15:58    Microbiology: Results for orders placed or performed during the hospital encounter of 11/30/21  Resp Panel by RT-PCR (Flu A&B, Covid) Nasopharyngeal Swab     Status: None   Collection Time: 11/30/21  9:55 AM   Specimen: Nasopharyngeal Swab; Nasopharyngeal(NP) swabs in vial transport medium  Result Value Ref Range Status   SARS Coronavirus 2 by RT PCR NEGATIVE NEGATIVE Final    Comment: (NOTE) SARS-CoV-2 target nucleic acids are NOT DETECTED.  The SARS-CoV-2 RNA is generally detectable in upper respiratory specimens during the acute phase of infection. The lowest concentration of SARS-CoV-2 viral copies this assay can detect is 138 copies/mL. A negative result does not preclude SARS-Cov-2 infection and should not be used as the sole basis for treatment or other patient management decisions. A negative result may occur with  improper specimen collection/handling, submission of specimen other than nasopharyngeal swab, presence of viral mutation(s) within the areas targeted by this assay, and inadequate number of viral copies(<138 copies/mL). A negative result must be combined with clinical observations, patient history, and epidemiological information. The expected result is Negative.  Fact Sheet for Patients:  BloggerCourse.comhttps://www.fda.gov/media/152166/download  Fact Sheet for Healthcare Providers:  SeriousBroker.ithttps://www.fda.gov/media/152162/download  This test is no t yet approved or cleared by the [place] FDA and  has been authorized for detection and/or diagnosis of SARS-CoV-2 by FDA under an Emergency Use Authorization (EUA). This EUA will remain  in effect (meaning this test can be used) for the duration of the COVID-19 declaration under Section 564(b)(1) of the Act, 21 U.S.C.section  360bbb-3(b)(1), unless the authorization is terminated  or revoked sooner.       Influenza A by PCR NEGATIVE NEGATIVE Final   Influenza B by PCR NEGATIVE NEGATIVE Final    Comment: (NOTE) The Xpert Xpress SARS-CoV-2/FLU/RSV plus assay is intended as an aid in the diagnosis of influenza from Nasopharyngeal swab specimens and should not be used as a sole basis for treatment. Nasal washings and aspirates are unacceptable for Xpert Xpress SARS-CoV-2/FLU/RSV testing.  Fact Sheet for Patients: BloggerCourse.comhttps://www.fda.gov/media/152166/download  Fact Sheet for Healthcare Providers: SeriousBroker.ithttps://www.fda.gov/media/152162/download  This test is not yet approved or cleared by the [place] FDA and has been authorized for detection and/or diagnosis of SARS-CoV-2 by FDA under an Emergency Use Authorization (EUA). This EUA will remain in effect (meaning this test can be used) for the duration of the COVID-19 declaration under Section 564(b)(1) of the Act, 21 U.S.C. section 360bbb-3(b)(1), unless the authorization is terminated or revoked.  Performed at [facility], [address] [place], [place] [id number]     Labs: CBC: Recent Labs  Lab 03/20/22 1629 03/21/22 0613  WBC 5.0 4.1  NEUTROABS  --  1.8  HGB 12.2 11.5*  HCT 35.0* 34.0*  MCV 100.3* 101.8*  PLT 284 269   Basic Metabolic Panel: Recent Labs  Lab 03/20/22 1629 03/20/22 1745 03/21/22 0639  NA 141  --  143  K 2.5*  --  3.7  CL 95*  --  107  CO2 34*  --  29  GLUCOSE 110*  --  77  BUN 8  --  7  CREATININE 0.61  --  0.46  CALCIUM 8.3*  --  7.2*  MG  --  1.8 2.1  PHOS  --   --  2.5   Liver Function Tests: Recent Labs  Lab 03/20/22 1629 03/21/22 0639  AST 41 31  ALT 11 10  ALKPHOS 53 46  BILITOT 0.5 0.6  PROT 6.3* 5.1*  ALBUMIN 3.5 2.8*   CBG: No results for input(s): GLUCAP in the last 168 hours.  Discharge time spent: less than 30 minutes.  Signed: Rickey Barbara, MD [facility] 03/21/2022

## 2022-03-21 NOTE — ED Notes (Signed)
Pt requesting to see "doctor on call" and requesting to be discharged and pain meds, Hospitalist notified

## 2022-03-21 NOTE — Progress Notes (Signed)
TOC CSW attached substance abuse resources  to pt's AVS.   Alexandra Dorsey M.Alexandra Dorsey, MSW, LCSWA [facility]  Transitions of Care Clinical Social Worker I Direct Dial: [phone]  Fax: [phone] [email or web address]  

## 2022-03-21 NOTE — Hospital Course (Signed)
46 y.o. female with medical history significant for essential hypertension, generalized anxiety disorder, chronic alcohol abuse, who is admitted to [facility] on 03/20/2022 with hypokalemia after presenting from home to [facility] ED complaining of fall

## 2022-10-31 ENCOUNTER — Encounter: Payer: Self-pay | Admitting: Neurology

## 2022-10-31 ENCOUNTER — Ambulatory Visit: Payer: Self-pay | Admitting: Neurology

## 2023-02-16 IMAGING — CR DG CHEST 2V
2 series · 2 of 2 positions shown · non-contrast
Comparison: Single-view of the chest 06/06/2017.

CLINICAL DATA: Congestion, cough, headache and body aches.

EXAM:
CHEST - 2 VIEW

[w chest pa]
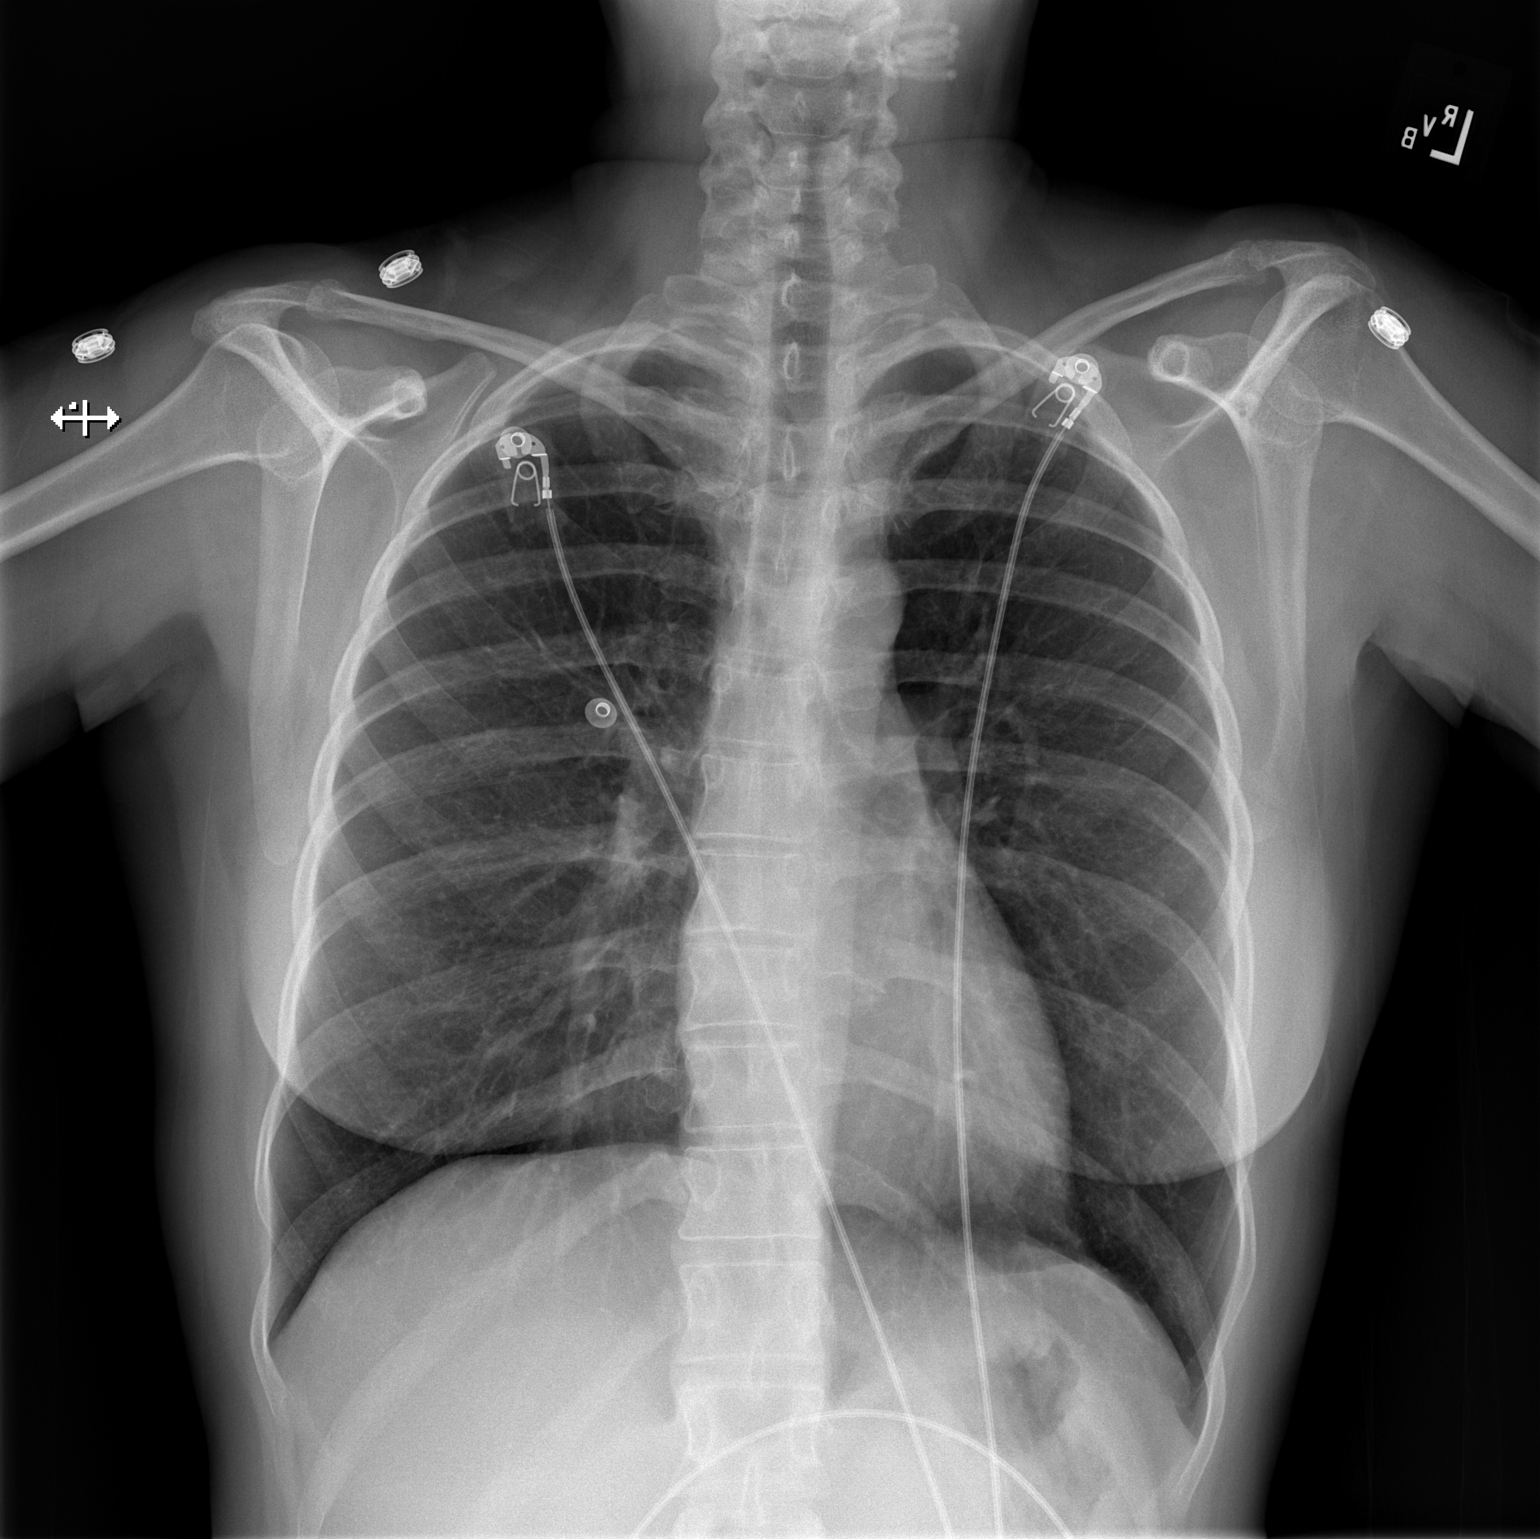

[w chest lat]
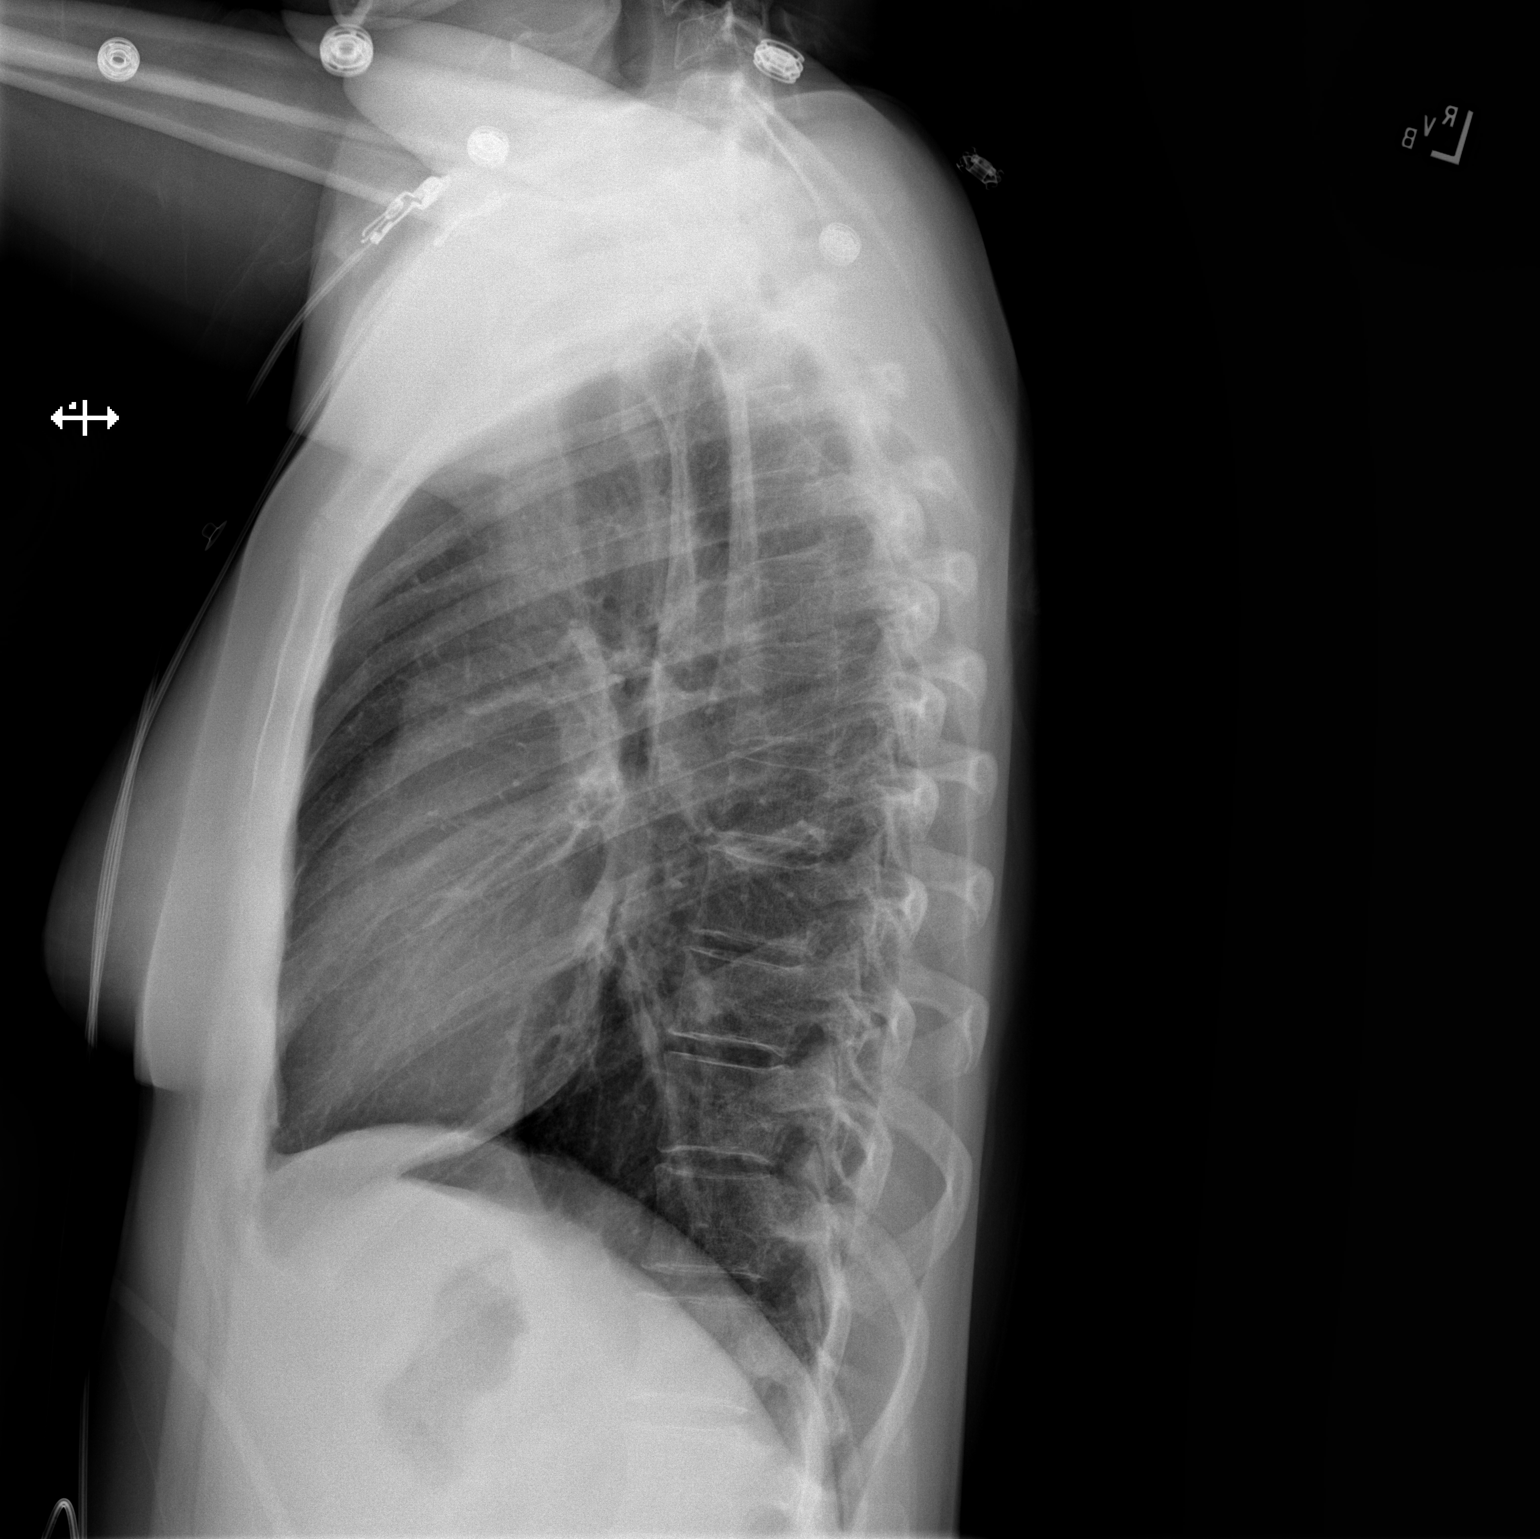

[2 of 2 positions shown; findings below may reference images not displayed]

FINDINGS: The lungs are clear. Heart size is normal. No pneumothorax or
pleural fluid. No acute or focal bony abnormality.
IMPRESSION: Negative chest.

## 2023-02-19 IMAGING — DX DG CHEST 1V PORT
1 series · 1 of 1 positions shown · non-contrast
Comparison: 04/22/2021 and prior exams

CLINICAL DATA: Cough.

EXAM:
PORTABLE CHEST 1 VIEW

[chest ap]
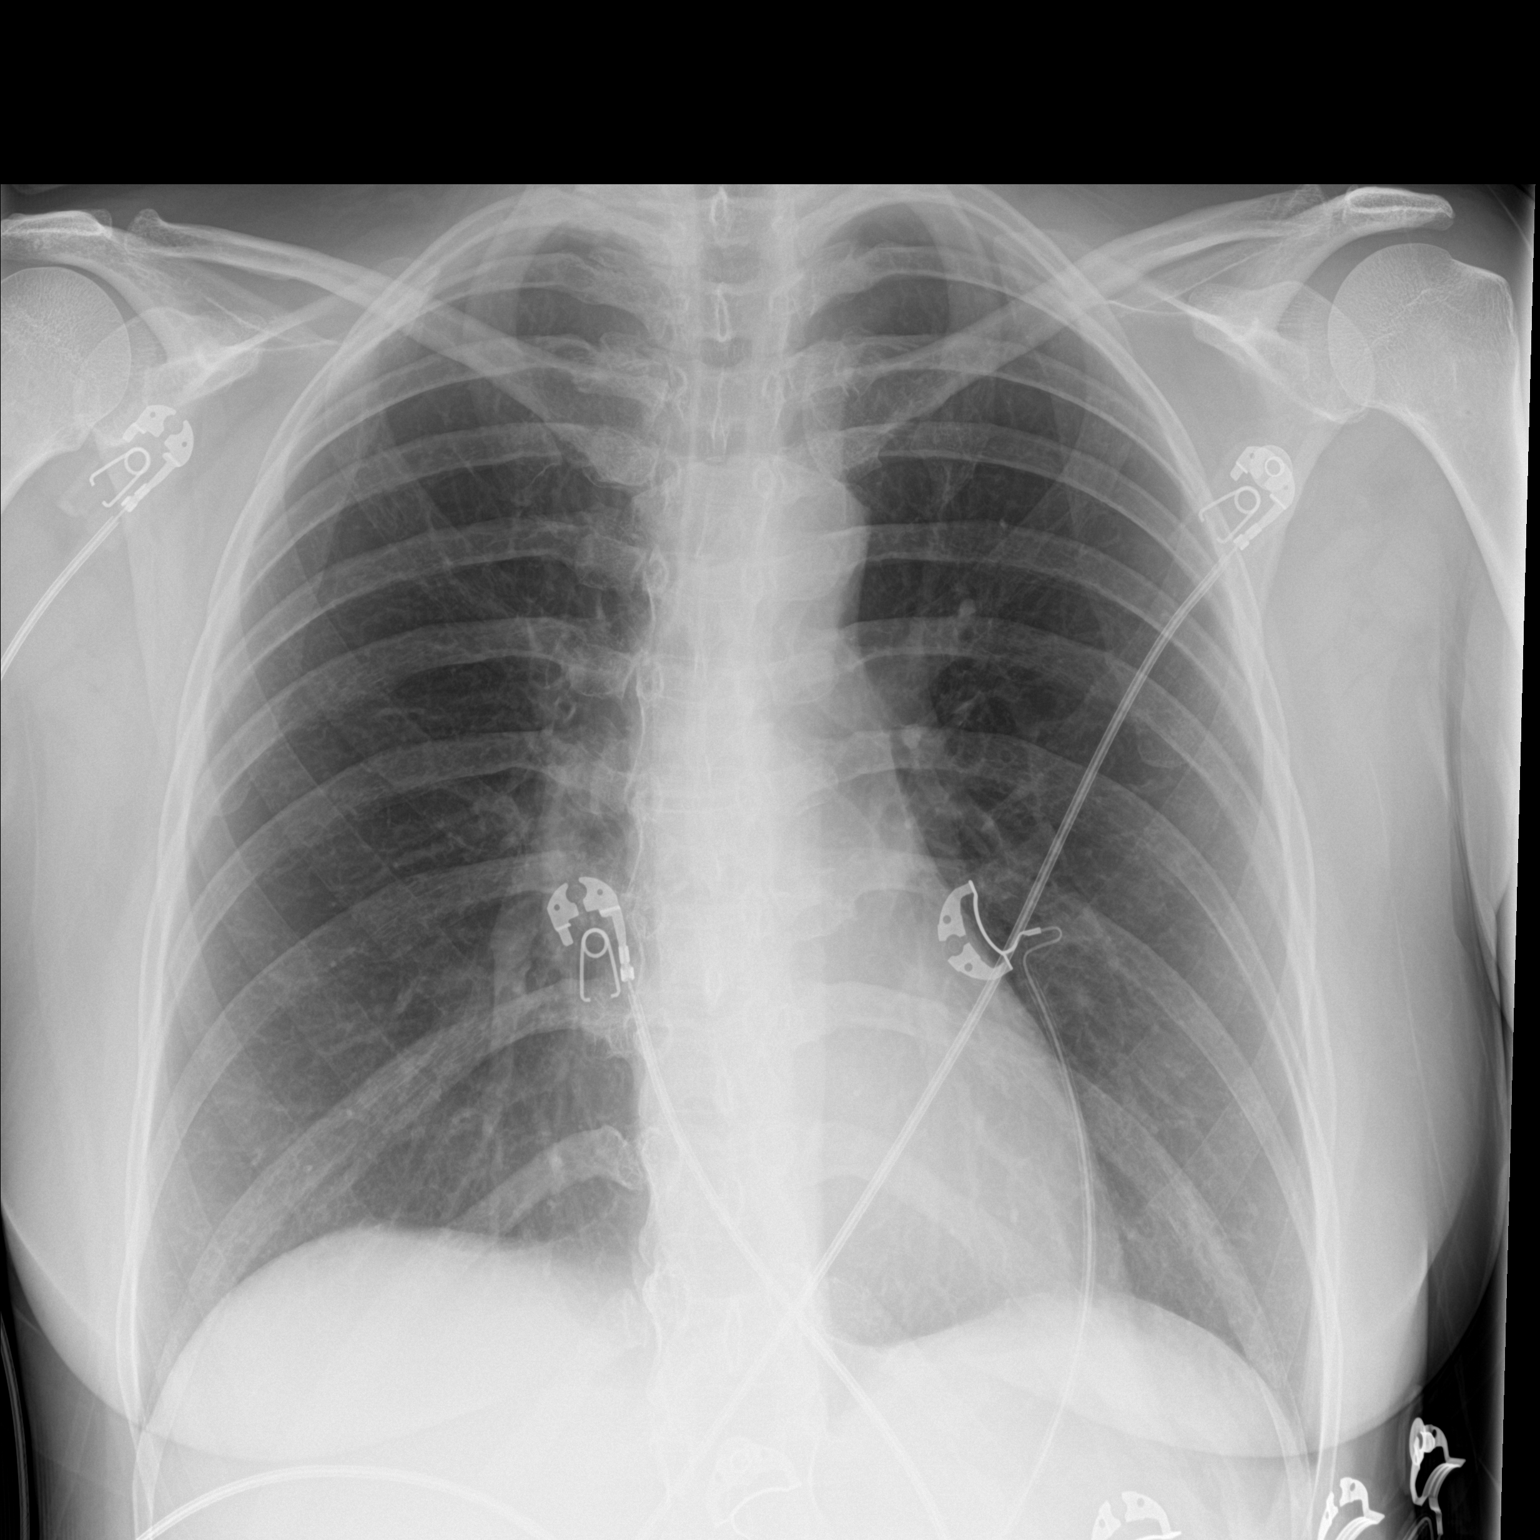

[1 of 1 positions shown; findings below may reference images not displayed]

FINDINGS: The cardiomediastinal silhouette is unremarkable.

There is no evidence of focal airspace disease, pulmonary edema,
suspicious pulmonary nodule/mass, pleural effusion, or pneumothorax.

No acute bony abnormalities are identified.
IMPRESSION: No active disease.

## 2023-03-28 DIAGNOSIS — E039 Hypothyroidism, unspecified: Secondary | ICD-10-CM | POA: Diagnosis not present

## 2023-03-28 DIAGNOSIS — R569 Unspecified convulsions: Secondary | ICD-10-CM | POA: Diagnosis not present

## 2023-03-28 DIAGNOSIS — M109 Gout, unspecified: Secondary | ICD-10-CM | POA: Diagnosis not present

## 2023-03-28 DIAGNOSIS — K219 Gastro-esophageal reflux disease without esophagitis: Secondary | ICD-10-CM | POA: Diagnosis not present

## 2023-03-28 DIAGNOSIS — M79671 Pain in right foot: Secondary | ICD-10-CM | POA: Diagnosis not present

## 2023-03-28 DIAGNOSIS — I10 Essential (primary) hypertension: Secondary | ICD-10-CM | POA: Diagnosis not present

## 2023-03-28 DIAGNOSIS — F5101 Primary insomnia: Secondary | ICD-10-CM | POA: Diagnosis not present

## 2023-03-28 DIAGNOSIS — F411 Generalized anxiety disorder: Secondary | ICD-10-CM | POA: Diagnosis not present

## 2023-03-28 DIAGNOSIS — Z9189 Other specified personal risk factors, not elsewhere classified: Secondary | ICD-10-CM | POA: Diagnosis not present

## 2023-04-05 DIAGNOSIS — F1094 Alcohol use, unspecified with alcohol-induced mood disorder: Secondary | ICD-10-CM | POA: Diagnosis not present

## 2023-04-05 DIAGNOSIS — F411 Generalized anxiety disorder: Secondary | ICD-10-CM | POA: Diagnosis not present

## 2023-04-12 DIAGNOSIS — F1094 Alcohol use, unspecified with alcohol-induced mood disorder: Secondary | ICD-10-CM | POA: Diagnosis not present

## 2023-04-12 DIAGNOSIS — F411 Generalized anxiety disorder: Secondary | ICD-10-CM | POA: Diagnosis not present

## 2023-04-26 DIAGNOSIS — F1094 Alcohol use, unspecified with alcohol-induced mood disorder: Secondary | ICD-10-CM | POA: Diagnosis not present

## 2023-04-26 DIAGNOSIS — F411 Generalized anxiety disorder: Secondary | ICD-10-CM | POA: Diagnosis not present

## 2023-04-26 DIAGNOSIS — F331 Major depressive disorder, recurrent, moderate: Secondary | ICD-10-CM | POA: Diagnosis not present

## 2023-05-10 DIAGNOSIS — F331 Major depressive disorder, recurrent, moderate: Secondary | ICD-10-CM | POA: Diagnosis not present

## 2023-05-10 DIAGNOSIS — F1094 Alcohol use, unspecified with alcohol-induced mood disorder: Secondary | ICD-10-CM | POA: Diagnosis not present

## 2023-05-10 DIAGNOSIS — F411 Generalized anxiety disorder: Secondary | ICD-10-CM | POA: Diagnosis not present

## 2023-05-19 DIAGNOSIS — F331 Major depressive disorder, recurrent, moderate: Secondary | ICD-10-CM | POA: Diagnosis not present

## 2023-05-19 DIAGNOSIS — F411 Generalized anxiety disorder: Secondary | ICD-10-CM | POA: Diagnosis not present

## 2023-05-19 DIAGNOSIS — F1094 Alcohol use, unspecified with alcohol-induced mood disorder: Secondary | ICD-10-CM | POA: Diagnosis not present

## 2023-05-31 DIAGNOSIS — F331 Major depressive disorder, recurrent, moderate: Secondary | ICD-10-CM | POA: Diagnosis not present

## 2023-05-31 DIAGNOSIS — F1094 Alcohol use, unspecified with alcohol-induced mood disorder: Secondary | ICD-10-CM | POA: Diagnosis not present

## 2023-05-31 DIAGNOSIS — F5101 Primary insomnia: Secondary | ICD-10-CM | POA: Diagnosis not present

## 2023-05-31 DIAGNOSIS — F411 Generalized anxiety disorder: Secondary | ICD-10-CM | POA: Diagnosis not present

## 2023-06-15 DIAGNOSIS — F1094 Alcohol use, unspecified with alcohol-induced mood disorder: Secondary | ICD-10-CM | POA: Diagnosis not present

## 2023-06-15 DIAGNOSIS — F411 Generalized anxiety disorder: Secondary | ICD-10-CM | POA: Diagnosis not present

## 2023-06-15 DIAGNOSIS — F331 Major depressive disorder, recurrent, moderate: Secondary | ICD-10-CM | POA: Diagnosis not present

## 2023-06-15 DIAGNOSIS — F5101 Primary insomnia: Secondary | ICD-10-CM | POA: Diagnosis not present

## 2023-06-28 DIAGNOSIS — R569 Unspecified convulsions: Secondary | ICD-10-CM | POA: Diagnosis not present

## 2023-06-28 DIAGNOSIS — M109 Gout, unspecified: Secondary | ICD-10-CM | POA: Diagnosis not present

## 2023-06-28 DIAGNOSIS — F411 Generalized anxiety disorder: Secondary | ICD-10-CM | POA: Diagnosis not present

## 2023-06-28 DIAGNOSIS — M79671 Pain in right foot: Secondary | ICD-10-CM | POA: Diagnosis not present

## 2023-06-28 DIAGNOSIS — R052 Subacute cough: Secondary | ICD-10-CM | POA: Diagnosis not present

## 2023-06-28 DIAGNOSIS — K219 Gastro-esophageal reflux disease without esophagitis: Secondary | ICD-10-CM | POA: Diagnosis not present

## 2023-06-28 DIAGNOSIS — E039 Hypothyroidism, unspecified: Secondary | ICD-10-CM | POA: Diagnosis not present

## 2023-06-28 DIAGNOSIS — F5101 Primary insomnia: Secondary | ICD-10-CM | POA: Diagnosis not present

## 2023-06-28 DIAGNOSIS — Z9189 Other specified personal risk factors, not elsewhere classified: Secondary | ICD-10-CM | POA: Diagnosis not present

## 2023-06-28 DIAGNOSIS — I10 Essential (primary) hypertension: Secondary | ICD-10-CM | POA: Diagnosis not present

## 2023-06-28 DIAGNOSIS — Z79899 Other long term (current) drug therapy: Secondary | ICD-10-CM | POA: Diagnosis not present

## 2023-06-28 DIAGNOSIS — N939 Abnormal uterine and vaginal bleeding, unspecified: Secondary | ICD-10-CM | POA: Diagnosis not present

## 2023-07-20 DIAGNOSIS — F331 Major depressive disorder, recurrent, moderate: Secondary | ICD-10-CM | POA: Diagnosis not present

## 2023-07-20 DIAGNOSIS — F411 Generalized anxiety disorder: Secondary | ICD-10-CM | POA: Diagnosis not present

## 2023-07-20 DIAGNOSIS — F1094 Alcohol use, unspecified with alcohol-induced mood disorder: Secondary | ICD-10-CM | POA: Diagnosis not present

## 2023-07-20 DIAGNOSIS — F5101 Primary insomnia: Secondary | ICD-10-CM | POA: Diagnosis not present

## 2023-08-17 DIAGNOSIS — N950 Postmenopausal bleeding: Secondary | ICD-10-CM | POA: Diagnosis not present

## 2023-09-11 ENCOUNTER — Emergency Department (HOSPITAL_COMMUNITY): Payer: 59

## 2023-09-11 ENCOUNTER — Observation Stay (HOSPITAL_COMMUNITY)
Admission: EM | Admit: 2023-09-11 | Discharge: 2023-09-13 | Disposition: A | Discharge status: Home / Self Care | Payer: 59 | Attending: Internal Medicine | Admitting: Internal Medicine

## 2023-09-11 ENCOUNTER — Other Ambulatory Visit: Payer: Self-pay

## 2023-09-11 ENCOUNTER — Encounter (HOSPITAL_COMMUNITY): Payer: Self-pay | Admitting: Emergency Medicine

## 2023-09-11 DIAGNOSIS — T5191XA Toxic effect of unspecified alcohol, accidental (unintentional), initial encounter: Secondary | ICD-10-CM | POA: Diagnosis not present

## 2023-09-11 DIAGNOSIS — D72828 Other elevated white blood cell count: Secondary | ICD-10-CM

## 2023-09-11 DIAGNOSIS — F10120 Alcohol abuse with intoxication, uncomplicated: Secondary | ICD-10-CM | POA: Diagnosis present

## 2023-09-11 DIAGNOSIS — J9601 Acute respiratory failure with hypoxia: Secondary | ICD-10-CM

## 2023-09-11 DIAGNOSIS — R059 Cough, unspecified: Secondary | ICD-10-CM | POA: Diagnosis present

## 2023-09-11 DIAGNOSIS — R000 Tachycardia, unspecified: Secondary | ICD-10-CM | POA: Diagnosis present

## 2023-09-11 DIAGNOSIS — X58XXXA Exposure to other specified factors, initial encounter: Secondary | ICD-10-CM | POA: Diagnosis not present

## 2023-09-11 DIAGNOSIS — Z03818 Encounter for observation for suspected exposure to other biological agents ruled out: Secondary | ICD-10-CM | POA: Diagnosis not present

## 2023-09-11 DIAGNOSIS — G9341 Metabolic encephalopathy: Secondary | ICD-10-CM | POA: Diagnosis present

## 2023-09-11 DIAGNOSIS — F1090 Alcohol use, unspecified, uncomplicated: Secondary | ICD-10-CM

## 2023-09-11 DIAGNOSIS — T510X1A Toxic effect of ethanol, accidental (unintentional), initial encounter: Secondary | ICD-10-CM | POA: Diagnosis present

## 2023-09-11 DIAGNOSIS — R404 Transient alteration of awareness: Secondary | ICD-10-CM | POA: Diagnosis not present

## 2023-09-11 DIAGNOSIS — Z8249 Family history of ischemic heart disease and other diseases of the circulatory system: Secondary | ICD-10-CM

## 2023-09-11 DIAGNOSIS — R739 Hyperglycemia, unspecified: Secondary | ICD-10-CM | POA: Diagnosis present

## 2023-09-11 DIAGNOSIS — T43591A Poisoning by other antipsychotics and neuroleptics, accidental (unintentional), initial encounter: Principal | ICD-10-CM | POA: Diagnosis present

## 2023-09-11 DIAGNOSIS — F411 Generalized anxiety disorder: Secondary | ICD-10-CM | POA: Diagnosis not present

## 2023-09-11 DIAGNOSIS — R197 Diarrhea, unspecified: Secondary | ICD-10-CM | POA: Diagnosis not present

## 2023-09-11 DIAGNOSIS — Y906 Blood alcohol level of 120-199 mg/100 ml: Secondary | ICD-10-CM | POA: Diagnosis present

## 2023-09-11 DIAGNOSIS — M109 Gout, unspecified: Secondary | ICD-10-CM | POA: Diagnosis not present

## 2023-09-11 DIAGNOSIS — D649 Anemia, unspecified: Secondary | ICD-10-CM | POA: Diagnosis not present

## 2023-09-11 DIAGNOSIS — R112 Nausea with vomiting, unspecified: Secondary | ICD-10-CM | POA: Diagnosis present

## 2023-09-11 DIAGNOSIS — F1010 Alcohol abuse, uncomplicated: Secondary | ICD-10-CM | POA: Insufficient documentation

## 2023-09-11 DIAGNOSIS — R296 Repeated falls: Secondary | ICD-10-CM | POA: Diagnosis present

## 2023-09-11 DIAGNOSIS — T50901A Poisoning by unspecified drugs, medicaments and biological substances, accidental (unintentional), initial encounter: Secondary | ICD-10-CM | POA: Diagnosis not present

## 2023-09-11 DIAGNOSIS — E039 Hypothyroidism, unspecified: Secondary | ICD-10-CM | POA: Insufficient documentation

## 2023-09-11 DIAGNOSIS — T43211A Poisoning by selective serotonin and norepinephrine reuptake inhibitors, accidental (unintentional), initial encounter: Secondary | ICD-10-CM | POA: Diagnosis not present

## 2023-09-11 DIAGNOSIS — T484X1A Poisoning by expectorants, accidental (unintentional), initial encounter: Secondary | ICD-10-CM | POA: Diagnosis present

## 2023-09-11 DIAGNOSIS — R0902 Hypoxemia: Secondary | ICD-10-CM | POA: Diagnosis not present

## 2023-09-11 DIAGNOSIS — E871 Hypo-osmolality and hyponatremia: Secondary | ICD-10-CM | POA: Diagnosis present

## 2023-09-11 DIAGNOSIS — Z823 Family history of stroke: Secondary | ICD-10-CM

## 2023-09-11 DIAGNOSIS — Z888 Allergy status to other drugs, medicaments and biological substances status: Secondary | ICD-10-CM | POA: Diagnosis not present

## 2023-09-11 DIAGNOSIS — F10920 Alcohol use, unspecified with intoxication, uncomplicated: Secondary | ICD-10-CM

## 2023-09-11 DIAGNOSIS — F1210 Cannabis abuse, uncomplicated: Secondary | ICD-10-CM | POA: Insufficient documentation

## 2023-09-11 DIAGNOSIS — Z79899 Other long term (current) drug therapy: Secondary | ICD-10-CM | POA: Diagnosis not present

## 2023-09-11 DIAGNOSIS — T887XXA Unspecified adverse effect of drug or medicament, initial encounter: Secondary | ICD-10-CM | POA: Diagnosis not present

## 2023-09-11 DIAGNOSIS — R9431 Abnormal electrocardiogram [ECG] [EKG]: Secondary | ICD-10-CM | POA: Diagnosis not present

## 2023-09-11 DIAGNOSIS — E876 Hypokalemia: Secondary | ICD-10-CM | POA: Diagnosis not present

## 2023-09-11 DIAGNOSIS — Z87891 Personal history of nicotine dependence: Secondary | ICD-10-CM

## 2023-09-11 DIAGNOSIS — T50904A Poisoning by unspecified drugs, medicaments and biological substances, undetermined, initial encounter: Secondary | ICD-10-CM | POA: Diagnosis present

## 2023-09-11 DIAGNOSIS — R0689 Other abnormalities of breathing: Secondary | ICD-10-CM | POA: Diagnosis not present

## 2023-09-11 DIAGNOSIS — Y92009 Unspecified place in unspecified non-institutional (private) residence as the place of occurrence of the external cause: Secondary | ICD-10-CM | POA: Diagnosis not present

## 2023-09-11 DIAGNOSIS — D72829 Elevated white blood cell count, unspecified: Secondary | ICD-10-CM | POA: Diagnosis present

## 2023-09-11 DIAGNOSIS — I10 Essential (primary) hypertension: Secondary | ICD-10-CM | POA: Diagnosis not present

## 2023-09-11 DIAGNOSIS — E038 Other specified hypothyroidism: Secondary | ICD-10-CM

## 2023-09-11 LAB — CBC WITH DIFFERENTIAL/PLATELET
Abs Immature Granulocytes: 0.07 10*3/uL (ref 0.00–0.07)
Basophils Absolute: 0 10*3/uL (ref 0.0–0.1)
Basophils Relative: 0 %
Eosinophils Absolute: 0 10*3/uL (ref 0.0–0.5)
Eosinophils Relative: 0 %
HCT: 29.1 % — ABNORMAL LOW (ref 36.0–46.0)
Hemoglobin: 10.2 g/dL — ABNORMAL LOW (ref 12.0–15.0)
Immature Granulocytes: 1 %
Lymphocytes Relative: 15 %
Lymphs Abs: 2 10*3/uL (ref 0.7–4.0)
MCH: 35.7 pg — ABNORMAL HIGH (ref 26.0–34.0)
MCHC: 35.1 g/dL (ref 30.0–36.0)
MCV: 101.7 fL — ABNORMAL HIGH (ref 80.0–100.0)
Monocytes Absolute: 0.6 10*3/uL (ref 0.1–1.0)
Monocytes Relative: 5 %
Neutro Abs: 10.3 10*3/uL — ABNORMAL HIGH (ref 1.7–7.7)
Neutrophils Relative %: 79 %
Platelets: 188 10*3/uL (ref 150–400)
RBC: 2.86 MIL/uL — ABNORMAL LOW (ref 3.87–5.11)
RDW: 15.6 % — ABNORMAL HIGH (ref 11.5–15.5)
WBC: 13.1 10*3/uL — ABNORMAL HIGH (ref 4.0–10.5)
nRBC: 0 % (ref 0.0–0.2)

## 2023-09-11 LAB — COMPREHENSIVE METABOLIC PANEL
ALT: 5 U/L (ref 0–44)
AST: 14 U/L — ABNORMAL LOW (ref 15–41)
Albumin: 3.5 g/dL (ref 3.5–5.0)
Alkaline Phosphatase: 72 U/L (ref 38–126)
Anion gap: 18 — ABNORMAL HIGH (ref 5–15)
BUN: 5 mg/dL — ABNORMAL LOW (ref 6–20)
CO2: 24 mmol/L (ref 22–32)
Calcium: 7.2 mg/dL — ABNORMAL LOW (ref 8.9–10.3)
Chloride: 90 mmol/L — ABNORMAL LOW (ref 98–111)
Creatinine, Ser: 0.46 mg/dL (ref 0.44–1.00)
GFR, Estimated: >60 mL/min (ref >60)
Glucose, Bld: 130 mg/dL — ABNORMAL HIGH (ref 70–99)
Potassium: <2 mmol/L — CL (ref 3.5–5.1)
Sodium: 132 mmol/L — ABNORMAL LOW (ref 135–145)
Total Bilirubin: 0.6 mg/dL (ref <1.2)
Total Protein: 7 g/dL (ref 6.5–8.1)

## 2023-09-11 LAB — RAPID URINE DRUG SCREEN, HOSP PERFORMED
Amphetamines: NOT DETECTED
Barbiturates: NOT DETECTED
Benzodiazepines: NOT DETECTED
Cocaine: NOT DETECTED
Opiates: NOT DETECTED
Tetrahydrocannabinol: POSITIVE — AB

## 2023-09-11 LAB — MAGNESIUM: Magnesium: 1.1 mg/dL — ABNORMAL LOW (ref 1.7–2.4)

## 2023-09-11 LAB — CBG MONITORING, ED: Glucose-Capillary: 123 mg/dL — ABNORMAL HIGH (ref 70–99)

## 2023-09-11 LAB — HCG, SERUM, QUALITATIVE: Preg, Serum: NEGATIVE

## 2023-09-11 LAB — ACETAMINOPHEN LEVEL: Acetaminophen (Tylenol), Serum: <10 ug/mL — ABNORMAL LOW (ref 10–30)

## 2023-09-11 LAB — ETHANOL: Alcohol, Ethyl (B): 193 mg/dL — ABNORMAL HIGH (ref <10)

## 2023-09-11 LAB — SALICYLATE LEVEL: Salicylate Lvl: <7 mg/dL — ABNORMAL LOW (ref 7.0–30.0)

## 2023-09-11 MED ORDER — LEVALBUTEROL HCL 0.63 MG/3ML IN NEBU: 0.6300 mg | INHALATION_SOLUTION | Freq: Four times a day (QID) | RESPIRATORY_TRACT | Status: DC | PRN

## 2023-09-11 MED ORDER — SODIUM CHLORIDE 0.9 % IV SOLN: 250.0000 mL | INTRAVENOUS | Status: AC | PRN

## 2023-09-11 MED ORDER — SODIUM CHLORIDE 0.9% FLUSH: 3.0000 mL | INTRAVENOUS | Status: DC | PRN

## 2023-09-11 MED ORDER — THIAMINE HCL 100 MG/ML IJ SOLN: 100.0000 mg | Freq: Every day | INTRAMUSCULAR | Status: DC

## 2023-09-11 MED ORDER — POTASSIUM CHLORIDE 10 MEQ/100ML IV SOLN
10.0000 meq | INTRAVENOUS | Status: AC
  Administered 2023-09-12 (×6): 10 meq via INTRAVENOUS
  Filled 2023-09-11 (×6): qty 100

## 2023-09-11 MED ORDER — NALOXONE HCL 0.4 MG/ML IJ SOLN: 0.4000 mg | INTRAMUSCULAR | Status: DC | PRN

## 2023-09-11 MED ORDER — ACETAMINOPHEN 325 MG PO TABS
650.0000 mg | ORAL_TABLET | Freq: Four times a day (QID) | ORAL | Status: DC | PRN
  Administered 2023-09-12: 650 mg via ORAL
  Filled 2023-09-11: qty 2

## 2023-09-11 MED ORDER — ONDANSETRON HCL 4 MG PO TABS: 4.0000 mg | ORAL_TABLET | Freq: Four times a day (QID) | ORAL | Status: DC | PRN

## 2023-09-11 MED ORDER — SODIUM CHLORIDE 0.9% FLUSH
3.0000 mL | Freq: Two times a day (BID) | INTRAVENOUS | Status: DC
  Administered 2023-09-12 – 2023-09-13 (×3): 3 mL via INTRAVENOUS

## 2023-09-11 MED ORDER — POTASSIUM CHLORIDE 10 MEQ/100ML IV SOLN
10.0000 meq | INTRAVENOUS | Status: AC
  Administered 2023-09-11 – 2023-09-12 (×4): 10 meq via INTRAVENOUS
  Filled 2023-09-11 (×4): qty 100

## 2023-09-11 MED ORDER — ONDANSETRON HCL 4 MG/2ML IJ SOLN: 4.0000 mg | Freq: Four times a day (QID) | INTRAMUSCULAR | Status: DC | PRN

## 2023-09-11 MED ORDER — POTASSIUM CHLORIDE 10 MEQ/100ML IV SOLN: 10.0000 meq | INTRAVENOUS | Status: DC

## 2023-09-11 MED ORDER — LACTATED RINGERS IV SOLN
INTRAVENOUS | Status: AC
Start: 2023-09-11 — End: 2023-09-12

## 2023-09-11 MED ORDER — FOLIC ACID 1 MG PO TABS
1.0000 mg | ORAL_TABLET | Freq: Every day | ORAL | Status: DC
Start: 2023-09-12 — End: 2023-09-13
  Administered 2023-09-12 – 2023-09-13 (×2): 1 mg via ORAL
  Filled 2023-09-11 (×2): qty 1

## 2023-09-11 MED ORDER — LACTATED RINGERS IV BOLUS
1000.0000 mL | Freq: Once | INTRAVENOUS | Status: AC
  Administered 2023-09-11: 1000 mL via INTRAVENOUS

## 2023-09-11 MED ORDER — ADULT MULTIVITAMIN W/MINERALS CH
1.0000 | ORAL_TABLET | Freq: Every day | ORAL | Status: DC
  Administered 2023-09-12 – 2023-09-13 (×2): 1 via ORAL
  Filled 2023-09-11 (×2): qty 1

## 2023-09-11 MED ORDER — THIAMINE MONONITRATE 100 MG PO TABS
100.0000 mg | ORAL_TABLET | Freq: Every day | ORAL | Status: DC
Start: 2023-09-12 — End: 2023-09-13
  Administered 2023-09-12 – 2023-09-13 (×2): 100 mg via ORAL
  Filled 2023-09-11 (×2): qty 1

## 2023-09-11 MED ORDER — MAGNESIUM SULFATE 2 GM/50ML IV SOLN
2.0000 g | Freq: Once | INTRAVENOUS | Status: AC
Start: 2023-09-11 — End: 2023-09-11
  Administered 2023-09-11: 2 g via INTRAVENOUS
  Filled 2023-09-11: qty 50

## 2023-09-11 MED ORDER — ENOXAPARIN SODIUM 40 MG/0.4ML IJ SOSY
40.0000 mg | PREFILLED_SYRINGE | INTRAMUSCULAR | Status: DC
  Administered 2023-09-12 – 2023-09-13 (×2): 40 mg via SUBCUTANEOUS
  Filled 2023-09-11 (×2): qty 0.4

## 2023-09-11 MED ORDER — CALCIUM GLUCONATE-NACL 1-0.675 GM/50ML-% IV SOLN
1.0000 g | Freq: Once | INTRAVENOUS | Status: AC
  Administered 2023-09-12: 1000 mg via INTRAVENOUS
  Filled 2023-09-11: qty 50

## 2023-09-11 MED ORDER — ACETAMINOPHEN 650 MG RE SUPP: 650.0000 mg | Freq: Four times a day (QID) | RECTAL | Status: DC | PRN

## 2023-09-11 NOTE — ED Provider Notes (Signed)
[place] EMERGENCY DEPARTMENT AT [facility] Provider Note   CSN: [id number] Arrival date & time: 09/11/23  2133     History  Chief Complaint  Patient presents with   Ingestion    Alexandra Dorsey is a 47 y.o. female.  Patient is a 47 year old female with a history of hypertension, heavy alcohol use, mental illness who is presenting today with EMS due to concern for unintentional overdose.  Patient's significant other called after she had started vomiting and was less responsive.  He reports she went to the doctor today and received a cough medication.  He reports she drank half of the bottle of a Phenergan/DM medication 30 mg since she got it he also noted she drank alcohol today and he saw her take one of her trazodone.  Unsure if she took any Seroquel.  She then started vomiting and when EMS arrived she was lying facedown on the floor with her pillow and a pool of emesis.  Initially sats were 82% but improved with nasal cannula oxygen.  Patient also became more responsive.  She is slurring her words but denies being suicidal.  She denies feeling nauseated at this time or having any pain.  The history is provided by the patient.  Ingestion       Home Medications Prior to Admission medications   Medication Sig Start Date End Date Taking? Authorizing Provider  acetaminophen (TYLENOL) 325 MG tablet Take 2 tablets (650 mg total) by mouth every 6 (six) hours as needed for mild pain (or Fever >/= 101). 04/27/21   Rodolph Bong, MD  amLODipine (NORVASC) 5 MG tablet Take 1 tablet (5 mg total) by mouth daily with lunch. 05/19/21 03/21/22  Gerhard Munch, MD  calcium-vitamin D (OSCAL WITH D) 500-200 MG-UNIT tablet Take 2 tablets by mouth 3 (three) times daily for 10 days. Patient not taking: Reported on 03/21/2022 04/27/21 03/20/22  Rodolph Bong, MD  folic acid (FOLVITE) 1 MG tablet Take 1 tablet (1 mg total) by mouth daily. Patient not taking: Reported on 03/21/2022 04/28/21    Rodolph Bong, MD  HYDROcodone-acetaminophen (NORCO/VICODIN) 5-325 MG tablet Take 1 tablet by mouth every 6 (six) hours as needed for severe pain. 03/21/22   Jerald Kief, MD  Multiple Vitamins-Minerals (ADULT ONE DAILY GUMMIES) CHEW Chew 1 tablet by mouth daily. Patient not taking: Reported on 03/21/2022    [provider]  ondansetron (ZOFRAN-ODT) 4 MG disintegrating tablet Take 1 tablet (4 mg total) by mouth every 8 (eight) hours as needed for nausea or vomiting. Patient not taking: Reported on 03/21/2022 11/30/21   Pricilla Loveless, MD  potassium chloride SA (KLOR-CON M) 20 MEQ tablet Take 1 tablet (20 mEq total) by mouth 2 (two) times daily for 3 days. 11/30/21 03/20/22  Pricilla Loveless, MD  thiamine 100 MG tablet Take 1 tablet (100 mg total) by mouth daily. Patient not taking: Reported on 03/20/2022 04/28/21   Rodolph Bong, MD      Allergies    Lisinopril and Norvasc [amlodipine besylate]    Review of Systems   Review of Systems  Physical Exam Updated Vital Signs BP 107/86   Pulse (!) 115   Temp 98.1 F (36.7 C) (Oral)   Resp 14   Ht 5\' 5"  (1.651 m)   Wt 55 kg   SpO2 97%   BMI 20.18 kg/m  Physical Exam Vitals and nursing note reviewed.  Constitutional:      General: She is not in  acute distress.    Appearance: She is well-developed.     Comments: Intoxicated  HENT:     Head: Normocephalic and atraumatic.     Mouth/Throat:     Comments: Dried emesis around her mouth and nose Eyes:     Extraocular Movements: Extraocular movements intact.     Pupils: Pupils are equal, round, and reactive to light.     Comments: Pupils are 3 mm bilaterally and reactive  Cardiovascular:     Rate and Rhythm: Regular rhythm. Tachycardia present.     Heart sounds: Normal heart sounds. No murmur heard.    No friction rub.  Pulmonary:     Effort: Pulmonary effort is normal.     Breath sounds: Normal breath sounds. No wheezing or rales.  Abdominal:     General: Bowel sounds  are normal. There is no distension.     Palpations: Abdomen is soft.     Tenderness: There is no abdominal tenderness. There is no guarding or rebound.  Musculoskeletal:        General: No tenderness. Normal range of motion.     Cervical back: Normal range of motion and neck supple.     Right lower leg: No edema.     Left lower leg: No edema.     Comments: No edema  Skin:    General: Skin is warm and dry.     Findings: No rash.  Neurological:     Mental Status: She is lethargic.     Comments: Noted to move all extremities without difficulty.  Can follow simple commands.  Psychiatric:        Behavior: Behavior normal.     ED Results / Procedures / Treatments   Labs (all labs ordered are listed, but only abnormal results are displayed) Labs Reviewed  COMPREHENSIVE METABOLIC PANEL - Abnormal; Notable for the following components:      Result Value   Sodium 132 (*)    Potassium <2.0 (*)    Chloride 90 (*)    Glucose, Bld 130 (*)    BUN 5 (*)    Calcium 7.2 (*)    AST 14 (*)    Anion gap 18 (*)    All other components within normal limits  SALICYLATE LEVEL - Abnormal; Notable for the following components:   Salicylate Lvl <7.0 (*)    All other components within normal limits  ETHANOL - Abnormal; Notable for the following components:   Alcohol, Ethyl (B) 193 (*)    All other components within normal limits  CBC WITH DIFFERENTIAL/PLATELET - Abnormal; Notable for the following components:   WBC 13.1 (*)    RBC 2.86 (*)    Hemoglobin 10.2 (*)    HCT 29.1 (*)    MCV 101.7 (*)    MCH 35.7 (*)    RDW 15.6 (*)    Neutro Abs 10.3 (*)    All other components within normal limits  MAGNESIUM - Abnormal; Notable for the following components:   Magnesium 1.1 (*)    All other components within normal limits  CBG MONITORING, ED - Abnormal; Notable for the following components:   Glucose-Capillary 123 (*)    All other components within normal limits  RAPID URINE DRUG SCREEN, HOSP  PERFORMED  HCG, SERUM, QUALITATIVE    EKG EKG Interpretation Date/Time:  Monday September 11 2023 21:44:54 EST Ventricular Rate:  121 PR Interval:  136 QRS Duration:  95 QT Interval:  340 QTC Calculation: 483 R Axis:  84  Text Interpretation: Sinus tachycardia Low voltage, precordial leads Borderline T abnormalities, anterior leads No significant change since last tracing Confirmed by Gwyneth Sprout ([id number]) on 09/11/2023 10:31:09 PM  Radiology No results found.  Procedures Procedures    Medications Ordered in ED Medications  lactated ringers infusion (has no administration in time range)  potassium chloride 10 mEq in 100 mL IVPB (has no administration in time range)  magnesium sulfate IVPB 2 g 50 mL (has no administration in time range)  lactated ringers bolus 1,000 mL (1,000 mLs Intravenous New Bag/Given 09/11/23 2211)    ED Course/ Medical Decision Making/ A&P                                 Medical Decision Making Amount and/or Complexity of Data Reviewed Independent Historian: EMS External Data Reviewed: notes. Labs: ordered. Decision-making details documented in ED Course. Radiology: ordered and independent interpretation performed. Decision-making details documented in ED Course. ECG/medicine tests: ordered and independent interpretation performed. Decision-making details documented in ED Course.  Risk Prescription drug management. Decision regarding hospitalization.   Pt with multiple medical problems and comorbidities and presenting today with a complaint that caries a high risk for morbidity and mortality.  Patient here after ingesting multiple substances that would cause altered mental status including Phenergan, trazodone, alcohol.  Patient is mentating and protecting her airway at this time.  Oxygen was removed and sats are greater than 90% on room air.  Patient is tachycardic here.  Blood pressure is stable.  Spoke with poison control who recommended  EKG in 6 hours, q4 tylenol lvl. CMP, Mg, Active bowels sounds, check for urine retention, IV Fluids , Watch for 12 hours.  Patient is denying any SI at this time. Patient had COVID test today at PCPs office which was negative, flu and RSV were also negative, she was at her PCPs today for complaint of URI symptoms.  Currently will need to observe the patient, chest x-ray to evaluate for any consolidation.  Potential that she had some aspiration with her emesis today but will continue monitoring vitals. Patient is on continuous cardiac monitoring.  I independently interpreted patient's rhythm and she is in sinus tachycardia at this time.  I independently interpreted her labs and EKG.  EKG with sinus tachycardia with nonspecific ST changes inferior laterally, EtOH is elevated at 193, salicylate level is negative, blood sugar is 123, CBC with leukocytosis of 13, hemoglobin of 10, normal platelet count, CMP with mild hyponatremia of 132 but hypokalemia of less than 2.0, normal creatinine and LFTs, anion gap of 18, hypomagnesemia with a level of 1.1.  Will continue IV hydration.  Patient given potassium and magnesium.  Given significant abnormalities patient will need admission for electrolyte replacements, as well as poison control's recommendation of every 6 hours EKG, every 4 hours Tylenol checks and monitoring urine output.  CRITICAL CARE Performed by: Madell Heino Total critical care time: 30 minutes Critical care time was exclusive of separately billable procedures and treating other patients. Critical care was necessary to treat or prevent imminent or life-threatening deterioration. Critical care was time spent personally by me on the following activities: development of treatment plan with patient and/or surrogate as well as nursing, discussions with consultants, evaluation of patient's response to treatment, examination of patient, obtaining history from patient or surrogate, ordering and performing  treatments and interventions, ordering and review of laboratory studies, ordering and review of radiographic  studies, pulse oximetry and re-evaluation of patient's condition.         Final Clinical Impression(s) / ED Diagnoses Final diagnoses:  Accidental drug ingestion, initial encounter  Hypokalemia  Hypocalcemia  Hypomagnesemia  Alcoholic intoxication without complication [facility])    Rx / DC Orders ED Discharge Orders     None         Gwyneth Sprout, MD 09/11/23 2258

## 2023-09-11 NOTE — H&P (Incomplete)
History and Physical    Alexandra Dorsey OZH:[id number] DOB: 04/22/1976 DOA: 09/11/2023  PCP: Wilfrid Lund, PA   Patient coming from: Home   Chief Complaint:  Chief Complaint  Patient presents with   Ingestion   ED TRIAGE note: Patient BIB EMS c/o ingestion. Per report patient took 60 mls of promethazine, unknown amount of Seroquel and Trazadone. Per report patient was found in the floor covered with emesis. Patient denies Intentional overdose.   HPI:  Alexandra Dorsey is a 47 y.o. female with medical history significant of essential hypertension, generalized anxiety disorder, chronic alcohol use, history of recurrent fall presented to emergency department for evaluation for ingestion of multiple medication/unintentional overdose. Patient's significant other called after she had started vomiting and was less responsive. He reports she went to the doctor today and received a cough medication. He reports she drank half of the bottle of a Phenergan/DM medication 30 mg since she got it he also noted she drank alcohol today and he saw her take one of her trazodone. Unsure if she took any Seroquel. She then started vomiting and when EMS arrived she was lying facedown on the floor with her pillow and a pool of emesis. Initially sats were 82% but improved with nasal cannula oxygen. Patient also became more responsive. She is slurring her words but denies being suicidal. She denies feeling nauseated at this time or having any pain.   ED Course:  I presentation to ED patient is tachycardic 122, tachypneic 25 and blood pressure 104/77.  O2 sats 96% on room air now. Blood acetaminophen level pending.  Normal salicylate level UDS positive with THC. Elevated blood glucose 123. Blood alcohol level 193. Pregnancy test negative. Low mag 1.1 CMP showing low sodium 132, low potassium 2, low chloride 90, and elevated anion gap 18. CBC showing leukocytosis 13.1, stable H&H and platelet count  188.  Pending chest x-ray. EKG sinus tachycardia heart rate 121.  In the ED patient got LR 1 L bolus, currently on LR 125 cc/h.  Mag 2 g, KCl 81M EQ x 4.  ED physician consulted poison control recommended to check Tylenol every 4 hours, EKG every 6 hours and observe for 24 hours.   Hospitalist has been consulted for further management of unintentional drug overdose with (trazodone, Phenergan, alcohol and THC), nausea, vomiting, diarrhea, acute hypoxic respiratory failure, alcohol withdrawal, hypocalcemia, hypomagnesemia, hyponatremia, hypokalemia and acute metabolic encephalopathy.   Significant labs in the ED: Lab Orders         Respiratory (~20 pathogens) panel by PCR         Gastrointestinal Panel by PCR , Stool         C Difficile Quick Screen w PCR reflex         Comprehensive metabolic panel         Salicylate level         Ethanol         Urine rapid drug screen (hosp performed)         CBC WITH DIFFERENTIAL         Magnesium         hCG, serum, qualitative         Acetaminophen level         Blood gas, arterial         Basic metabolic panel         Magnesium         HIV Antibody (routine testing w rflx)  CBC         CBG monitoring, ED       Review of Systems:  ROS  Past Medical History:  Diagnosis Date   Anxiety    Hypertension     History reviewed. No pertinent surgical history.   reports that she has quit smoking. She has never used smokeless tobacco. She reports current alcohol use of about 10.0 standard drinks of alcohol per week. She reports that she does not use drugs.  Allergies  Allergen Reactions   Lisinopril Cough   Norvasc [Amlodipine Besylate] Other (See Comments)    Feet hurts    Family History  Problem Relation Age of Onset   Hypertension Mother    Hypertension Father    Stroke Maternal Grandfather     Prior to Admission medications   Medication Sig Start Date End Date Taking? Authorizing Provider  acetaminophen (TYLENOL) 325  MG tablet Take 2 tablets (650 mg total) by mouth every 6 (six) hours as needed for mild pain (or Fever >/= 101). 04/27/21   Rodolph Bong, MD  amLODipine (NORVASC) 5 MG tablet Take 1 tablet (5 mg total) by mouth daily with lunch. 05/19/21 03/21/22  Gerhard Munch, MD  calcium-vitamin D (OSCAL WITH D) 500-200 MG-UNIT tablet Take 2 tablets by mouth 3 (three) times daily for 10 days. Patient not taking: Reported on 03/21/2022 04/27/21 03/20/22  Rodolph Bong, MD  folic acid (FOLVITE) 1 MG tablet Take 1 tablet (1 mg total) by mouth daily. Patient not taking: Reported on 03/21/2022 04/28/21   Rodolph Bong, MD  HYDROcodone-acetaminophen (NORCO/VICODIN) 5-325 MG tablet Take 1 tablet by mouth every 6 (six) hours as needed for severe pain. 03/21/22   Jerald Kief, MD  Multiple Vitamins-Minerals (ADULT ONE DAILY GUMMIES) CHEW Chew 1 tablet by mouth daily. Patient not taking: Reported on 03/21/2022    [provider]  ondansetron (ZOFRAN-ODT) 4 MG disintegrating tablet Take 1 tablet (4 mg total) by mouth every 8 (eight) hours as needed for nausea or vomiting. Patient not taking: Reported on 03/21/2022 11/30/21   Pricilla Loveless, MD  potassium chloride SA (KLOR-CON M) 20 MEQ tablet Take 1 tablet (20 mEq total) by mouth 2 (two) times daily for 3 days. 11/30/21 03/20/22  Pricilla Loveless, MD  thiamine 100 MG tablet Take 1 tablet (100 mg total) by mouth daily. Patient not taking: Reported on 03/20/2022 04/28/21   Rodolph Bong, MD     Physical Exam: Vitals:   09/11/23 2136 09/11/23 2140 09/11/23 2144 09/11/23 2257  BP:  104/77  107/86  Pulse:  (!) 122  (!) 115  Resp:  (!) 25  14  Temp:   98.1 F (36.7 C)   TempSrc:   Oral   SpO2:  96%  97%  Weight: 55 kg     Height: 5\' 5"  (1.651 m)       Physical Exam   Labs on Admission: I have personally reviewed following labs and imaging studies  CBC: Recent Labs  Lab 09/11/23 2138  WBC 13.1*  NEUTROABS 10.3*  HGB 10.2*  HCT 29.1*  MCV 101.7*   PLT 188   Basic Metabolic Panel: Recent Labs  Lab 09/11/23 2138  NA 132*  K <2.0*  CL 90*  CO2 24  GLUCOSE 130*  BUN 5*  CREATININE 0.46  CALCIUM 7.2*  MG 1.1*   GFR: Estimated Creatinine Clearance: 76.3 mL/min (by C-G formula based on SCr of 0.46 mg/dL). Liver Function Tests: Recent Labs  Lab 09/11/23 2138  AST 14*  ALT 5  ALKPHOS 72  BILITOT 0.6  PROT 7.0  ALBUMIN 3.5   No results for input(s): "LIPASE", "AMYLASE" in the last 168 hours. No results for input(s): "AMMONIA" in the last 168 hours. Coagulation Profile: No results for input(s): "INR", "PROTIME" in the last 168 hours. Cardiac Enzymes: No results for input(s): "CKTOTAL", "CKMB", "CKMBINDEX", "TROPONINI", "TROPONINIHS" in the last 168 hours. BNP (last 3 results) No results for input(s): "BNP" in the last 8760 hours. HbA1C: No results for input(s): "HGBA1C" in the last 72 hours. CBG: Recent Labs  Lab 09/11/23 2209  GLUCAP 123*   Lipid Profile: No results for input(s): "CHOL", "HDL", "LDLCALC", "TRIG", "CHOLHDL", "LDLDIRECT" in the last 72 hours. Thyroid Function Tests: No results for input(s): "TSH", "T4TOTAL", "FREET4", "T3FREE", "THYROIDAB" in the last 72 hours. Anemia Panel: No results for input(s): "VITAMINB12", "FOLATE", "FERRITIN", "TIBC", "IRON", "RETICCTPCT" in the last 72 hours. Urine analysis:    Component Value Date/Time   COLORURINE AMBER (A) 05/19/2021 1030   APPEARANCEUR CLEAR 05/19/2021 1030   LABSPEC 1.018 05/19/2021 1030   PHURINE 5.0 05/19/2021 1030   GLUCOSEU NEGATIVE 05/19/2021 1030   HGBUR NEGATIVE 05/19/2021 1030   BILIRUBINUR NEGATIVE 05/19/2021 1030   BILIRUBINUR negative 09/01/2017 1514   BILIRUBINUR neg 02/05/2015 1111   KETONESUR NEGATIVE 05/19/2021 1030   PROTEINUR 30 (A) 05/19/2021 1030   UROBILINOGEN negative (A) 09/01/2017 1514   UROBILINOGEN 0.2 01/02/2014 0027   NITRITE POSITIVE (A) 05/19/2021 1030   LEUKOCYTESUR NEGATIVE 05/19/2021 1030    Radiological  Exams on Admission: I have personally reviewed images No results found.  EKG: My personal interpretation of EKG shows: ***    Assessment/Plan: Principal Problem:   Drug overdose of undetermined intent Active Problems:   Hypokalemia   Hypomagnesemia   Acute hypoxic respiratory failure (HCC)   Hyponatremia   Chronic alcohol use   Acute metabolic encephalopathy   Essential hypertension   Nausea, vomiting and diarrhea   Tetrahydrocannabinol (THC) use disorder, mild, abuse   Chronic anemia   Leukocytosis   Generalized anxiety disorder   Hypothyroidism    Assessment and Plan: Unintentional drug overdose > Patient Mixed Phenergan albuterol, trazodone, alcohol, THC and Seroquel onion nationally as per patient's significant other at the bedside. -Patient was having nausea, vomiting and diarrhea for last 10 days and then developed cough.  Went to see primary care doctor and got prescription of Phenergan today.  Patient did not have any fever and chill at home. -Poison control recommended check Tylenol level every 4 hour, EKG every 6 hour, and monitor urine output for any retention.  Need to observe patient for 24 hours.  No other intervention at this time  Acute metabolic encephalopathy-secondary to drug overdose  Acute hypoxic respiratory failure - Pending chest x-ray  Hyponatremia Hypokalemia Hypomagnesemia Hypocalcemia  Alcohol use History of chronic alcohol use -Blood alcohol level 193.  Continue CIWA protocol. - Deferring lorazepam/phenobarbital at this time as patient is already obtained and high risk for respiratory decompression.  Generalized anxiety disorder  Chronic anemia   DVT prophylaxis:  Lovenox Code Status:  Full Code-by default. Diet:  Family Communication:  *** Family was present at bedside, at the time of interview.  Opportunity was given to ask question and all questions were answered satisfactorily.  Disposition Plan:  ***  Consults:  ***   Admission status:   Inpatient, Step Down Unit  Severity of Illness: The appropriate patient status for this patient is INPATIENT. Inpatient  status is judged to be reasonable and necessary in order to provide the required intensity of service to ensure the patient's safety. The patient's presenting symptoms, physical exam findings, and initial radiographic and laboratory data in the context of their chronic comorbidities is felt to place them at high risk for further clinical deterioration. Furthermore, it is not anticipated that the patient will be medically stable for discharge from the hospital within 2 midnights of admission.   * I certify that at the point of admission it is my clinical judgment that the patient will require inpatient hospital care spanning beyond 2 midnights from the point of admission due to high intensity of service, high risk for further deterioration and high frequency of surveillance required.Marland Kitchen    Tereasa Coop, MD [facility]  How to contact the [facility] Attending or Consulting provider 7A - 7P or covering provider during after hours 7P -7A, for this patient.  Check the care team in [facility] and look for a) attending/consulting [facility] provider listed and b) the [facility] team listed Log into [email or web address] and use [facility] universal password to access. If you do not have the password, please contact the hospital operator. Locate the [facility] provider you are looking for under [facility] and page to a number that you can be directly reached. If you still have difficulty reaching the provider, please page the [facility] (Director on Call) for the Hospitalists listed on amion for assistance.  09/11/2023, 11:53 PM

## 2023-09-11 NOTE — ED Triage Notes (Signed)
Patient BIB EMS c/o ingestion. Per report patient took 60 mls of promethazine, unknown amount of Seroquel and Trazadone. Per report patient was found in the floor covered with emesis. Patient denies Intentional overdose.

## 2023-09-12 DIAGNOSIS — G9341 Metabolic encephalopathy: Secondary | ICD-10-CM | POA: Diagnosis not present

## 2023-09-12 DIAGNOSIS — J9601 Acute respiratory failure with hypoxia: Secondary | ICD-10-CM | POA: Diagnosis not present

## 2023-09-12 DIAGNOSIS — R059 Cough, unspecified: Secondary | ICD-10-CM | POA: Diagnosis not present

## 2023-09-12 DIAGNOSIS — T50901A Poisoning by unspecified drugs, medicaments and biological substances, accidental (unintentional), initial encounter: Secondary | ICD-10-CM | POA: Diagnosis not present

## 2023-09-12 LAB — BLOOD GAS, ARTERIAL
Acid-Base Excess: 6 mmol/L — ABNORMAL HIGH (ref 0.0–2.0)
Bicarbonate: 30.6 mmol/L — ABNORMAL HIGH (ref 20.0–28.0)
Drawn by: 23281
O2 Saturation: 94.3 %
Patient temperature: 36.9
pCO2 arterial: 43 mm[Hg] (ref 32–48)
pH, Arterial: 7.46 — ABNORMAL HIGH (ref 7.35–7.45)
pO2, Arterial: 77 mm[Hg] — ABNORMAL LOW (ref 83–108)

## 2023-09-12 LAB — GASTROINTESTINAL PANEL BY PCR, STOOL (REPLACES STOOL CULTURE)

## 2023-09-12 LAB — MAGNESIUM
Magnesium: 1.5 mg/dL — ABNORMAL LOW (ref 1.7–2.4)
Magnesium: 1.8 mg/dL (ref 1.7–2.4)

## 2023-09-12 LAB — HEPATIC FUNCTION PANEL
ALT: <5 U/L (ref 0–44)
AST: 14 U/L — ABNORMAL LOW (ref 15–41)
Albumin: 3 g/dL — ABNORMAL LOW (ref 3.5–5.0)
Alkaline Phosphatase: 67 U/L (ref 38–126)
Bilirubin, Direct: 0.1 mg/dL (ref 0.0–0.2)
Indirect Bilirubin: 0.7 mg/dL (ref 0.3–0.9)
Total Bilirubin: 0.8 mg/dL (ref <1.2)
Total Protein: 6.1 g/dL — ABNORMAL LOW (ref 6.5–8.1)

## 2023-09-12 LAB — CBC WITH DIFFERENTIAL/PLATELET
Abs Immature Granulocytes: 0.06 10*3/uL (ref 0.00–0.07)
Basophils Absolute: 0 10*3/uL (ref 0.0–0.1)
Basophils Relative: 0 %
Eosinophils Absolute: 0 10*3/uL (ref 0.0–0.5)
Eosinophils Relative: 0 %
HCT: 27.8 % — ABNORMAL LOW (ref 36.0–46.0)
Hemoglobin: 8.9 g/dL — ABNORMAL LOW (ref 12.0–15.0)
Immature Granulocytes: 1 %
Lymphocytes Relative: 13 %
Lymphs Abs: 1.2 10*3/uL (ref 0.7–4.0)
MCH: 34.1 pg — ABNORMAL HIGH (ref 26.0–34.0)
MCHC: 32 g/dL (ref 30.0–36.0)
MCV: 106.5 fL — ABNORMAL HIGH (ref 80.0–100.0)
Monocytes Absolute: 0.4 10*3/uL (ref 0.1–1.0)
Monocytes Relative: 4 %
Neutro Abs: 7.7 10*3/uL (ref 1.7–7.7)
Neutrophils Relative %: 82 %
Platelets: 181 10*3/uL (ref 150–400)
RBC: 2.61 MIL/uL — ABNORMAL LOW (ref 3.87–5.11)
RDW: 16.2 % — ABNORMAL HIGH (ref 11.5–15.5)
WBC: 9.4 10*3/uL (ref 4.0–10.5)
nRBC: 0 % (ref 0.0–0.2)

## 2023-09-12 LAB — BASIC METABOLIC PANEL
Anion gap: 11 (ref 5–15)
Anion gap: 10 (ref 5–15)
BUN: <5 mg/dL — ABNORMAL LOW (ref 6–20)
BUN: <5 mg/dL — ABNORMAL LOW (ref 6–20)
CO2: 31 mmol/L (ref 22–32)
CO2: 35 mmol/L — ABNORMAL HIGH (ref 22–32)
Calcium: 7.8 mg/dL — ABNORMAL LOW (ref 8.9–10.3)
Calcium: 7.9 mg/dL — ABNORMAL LOW (ref 8.9–10.3)
Chloride: 95 mmol/L — ABNORMAL LOW (ref 98–111)
Chloride: 90 mmol/L — ABNORMAL LOW (ref 98–111)
Creatinine, Ser: 0.36 mg/dL — ABNORMAL LOW (ref 0.44–1.00)
Creatinine, Ser: 0.64 mg/dL (ref 0.44–1.00)
GFR, Estimated: >60 mL/min (ref >60)
GFR, Estimated: >60 mL/min (ref >60)
Glucose, Bld: 84 mg/dL (ref 70–99)
Glucose, Bld: 105 mg/dL — ABNORMAL HIGH (ref 70–99)
Potassium: 2.9 mmol/L — ABNORMAL LOW (ref 3.5–5.1)
Potassium: 3 mmol/L — ABNORMAL LOW (ref 3.5–5.1)
Sodium: 137 mmol/L (ref 135–145)
Sodium: 135 mmol/L (ref 135–145)

## 2023-09-12 LAB — ACETAMINOPHEN LEVEL
Acetaminophen (Tylenol), Serum: <10 ug/mL — ABNORMAL LOW (ref 10–30)
Acetaminophen (Tylenol), Serum: <10 ug/mL — ABNORMAL LOW (ref 10–30)
Acetaminophen (Tylenol), Serum: <10 ug/mL — ABNORMAL LOW (ref 10–30)
Acetaminophen (Tylenol), Serum: 14 ug/mL (ref 10–30)
Acetaminophen (Tylenol), Serum: <10 ug/mL — ABNORMAL LOW (ref 10–30)

## 2023-09-12 LAB — RESPIRATORY PANEL BY PCR

## 2023-09-12 LAB — CLOSTRIDIUM DIFFICILE BY PCR, REFLEXED: Toxigenic C. Difficile by PCR: NEGATIVE

## 2023-09-12 LAB — C DIFFICILE QUICK SCREEN W PCR REFLEX
C Diff antigen: POSITIVE — AB
C Diff toxin: NEGATIVE

## 2023-09-12 LAB — URINALYSIS, ROUTINE W REFLEX MICROSCOPIC
Bilirubin Urine: NEGATIVE
Glucose, UA: NEGATIVE mg/dL
Ketones, ur: NEGATIVE mg/dL
Leukocytes,Ua: NEGATIVE
Nitrite: NEGATIVE
Protein, ur: NEGATIVE mg/dL
Specific Gravity, Urine: 1.002 — ABNORMAL LOW (ref 1.005–1.030)
pH: 6 (ref 5.0–8.0)

## 2023-09-12 LAB — FOLATE: Folate: 33.9 ng/mL (ref >5.9)

## 2023-09-12 LAB — VITAMIN B12: Vitamin B-12: 191 pg/mL (ref 180–914)

## 2023-09-12 LAB — HIV ANTIBODY (ROUTINE TESTING W REFLEX): HIV Screen 4th Generation wRfx: NONREACTIVE

## 2023-09-12 LAB — VITAMIN D 25 HYDROXY (VIT D DEFICIENCY, FRACTURES): Vit D, 25-Hydroxy: 109.82 ng/mL — ABNORMAL HIGH (ref 30–100)

## 2023-09-12 MED ORDER — LEVOTHYROXINE SODIUM 25 MCG PO TABS
25.0000 ug | ORAL_TABLET | Freq: Every day | ORAL | Status: DC
  Administered 2023-09-12 – 2023-09-13 (×2): 25 ug via ORAL
  Filled 2023-09-12 (×2): qty 1

## 2023-09-12 MED ORDER — VITAMIN B-12 1000 MCG PO TABS
1000.0000 ug | ORAL_TABLET | Freq: Every day | ORAL | Status: DC
  Administered 2023-09-13: 1000 ug via ORAL
  Filled 2023-09-12: qty 1

## 2023-09-12 MED ORDER — CYANOCOBALAMIN 1000 MCG/ML IJ SOLN
1000.0000 ug | Freq: Once | INTRAMUSCULAR | Status: AC
  Administered 2023-09-12: 1000 ug via INTRAMUSCULAR
  Filled 2023-09-12: qty 1

## 2023-09-12 MED ORDER — POTASSIUM CHLORIDE 20 MEQ PO PACK
60.0000 meq | PACK | Freq: Once | ORAL | Status: AC
  Administered 2023-09-12: 60 meq via ORAL
  Filled 2023-09-12: qty 3

## 2023-09-12 MED ORDER — MAGNESIUM SULFATE 2 GM/50ML IV SOLN
2.0000 g | Freq: Once | INTRAVENOUS | Status: AC
  Administered 2023-09-12: 2 g via INTRAVENOUS
  Filled 2023-09-12: qty 50

## 2023-09-12 MED ORDER — DEXTROMETHORPHAN POLISTIREX ER 30 MG/5ML PO SUER
30.0000 mg | Freq: Three times a day (TID) | ORAL | Status: DC | PRN
  Administered 2023-09-12 (×2): 30 mg via ORAL
  Filled 2023-09-12 (×4): qty 5

## 2023-09-12 NOTE — Progress Notes (Signed)
  Bedside patient nurse reported that patient is alert and oriented.  Patient passed bedside swallow screen.  Able to drink and swallow without any difficulty and coughing.  Patient is able to follow command. Patient is requesting for ice chips multiple times. - Resumed oral diet.   Tereasa Coop, MD [facility] 09/12/2023, 4:10 AM

## 2023-09-12 NOTE — Progress Notes (Addendum)
Hypokalemia Hypomagnesemia Admitted last night with hyponatremia, hypokalemia hypomagnesemia.  Initially repleted with KCl total 100 mEq IV and mag sulfate 2 g.  Morning labs showing potassium 2.9 and mag 1.5.  Checking stat BMP and mag level if it is low need to replete again. Goal to keep potassium above 3.5 and mag above 2.  -BMP showing low potassium 3.  Giving oral KCl 60 mEq.  Mag 1.9 and normalized.  Tereasa Coop, MD [facility] 09/12/2023, 7:08 PM

## 2023-09-12 NOTE — ED Notes (Signed)
ED TO INPATIENT HANDOFF REPORT  Name/Age/Gender Alexandra Dorsey 47 y.o. female  Code Status    Code Status Orders  (From admission, onward)           Start     Ordered   09/11/23 2349  Full code  Continuous       Question:  By:  Answer:  Default: patient does not have capacity for decision making, no surrogate or prior directive available   09/11/23 2350           Code Status History     Date Active Date Inactive Code Status Order ID Comments User Context   03/20/2022 2212 03/21/2022 1645 Full Code [id number]  Angie Fava, DO ED   04/25/2021 1930 04/28/2021 0156 Full Code [id number]  Rodolph Bong, MD Inpatient   06/06/2017 2027 06/07/2017 2028 Full Code [id number]  Howard Pouch, MD Inpatient       Home/SNF/Other Home  Chief Complaint Drug overdose of undetermined intent [T50.904A]  Level of Care/Admitting Diagnosis ED Disposition     ED Disposition  Admit   Condition  --   Comment  Hospital Area: [facility] [[id number]]  Level of Care: Stepdown [14]  Admit to SDU based on following criteria: Other see comments  Comments: Unintentional drug overdose with Phenergan, trazodone, alcohol, THC and Seroquel.  May admit patient to [facility] or [facility] if equivalent level of care is available:: No  Covid Evaluation: Asymptomatic - no recent exposure (last 10 days) testing not required  Diagnosis: Drug overdose of undetermined intent [[id number]]  Admitting Physician: Tereasa Coop [[id number]]  Attending Physician: Tereasa Coop [[id number]]  Certification:: I certify this patient will need inpatient services for at least 2 midnights  Expected Medical Readiness: 09/16/2023          Medical History Past Medical History:  Diagnosis Date   Anxiety    Hypertension     Allergies Allergies  Allergen Reactions   Lisinopril Cough   Norvasc [Amlodipine Besylate] Other (See Comments)    Feet hurts    IV  Location/Drains/Wounds Patient Lines/Drains/Airways Status     Active Line/Drains/Airways     Name Placement date Placement time Site Days   Peripheral IV 09/11/23 20 G Right Antecubital 09/11/23  2155  Antecubital  1   Peripheral IV 09/11/23 Left;Posterior Hand 09/11/23  2156  Hand  1            Labs/Imaging Results for orders placed or performed during the hospital encounter of 09/11/23 (from the past 48 hour(s))  Comprehensive metabolic panel     Status: Abnormal   Collection Time: 09/11/23  9:38 PM  Result Value Ref Range   Sodium 132 (L) 135 - 145 mmol/L   Potassium <2.0 (LL) 3.5 - 5.1 mmol/L    Comment: CRITICAL RESULT CALLED TO, READ BACK BY AND VERIFIED WITH ROBINSON, J RN @ 2251 ON 09/11/2023 BY MTA    Chloride 90 (L) 98 - 111 mmol/L   CO2 24 22 - 32 mmol/L   Glucose, Bld 130 (H) 70 - 99 mg/dL    Comment: Glucose reference range applies only to samples taken after fasting for at least 8 hours.   BUN 5 (L) 6 - 20 mg/dL   Creatinine, Ser 6.44 0.44 - 1.00 mg/dL   Calcium 7.2 (L) 8.9 - 10.3 mg/dL   Total Protein 7.0 6.5 - 8.1 g/dL   Albumin 3.5 3.5 - 5.0 g/dL   AST 14 (L) 15 -  41 U/L   ALT 5 0 - 44 U/L   Alkaline Phosphatase 72 38 - 126 U/L   Total Bilirubin 0.6 <1.2 mg/dL   GFR, Estimated >57 >32 mL/min    Comment: (NOTE) Calculated using the CKD-EPI Creatinine Equation (2021)    Anion gap 18 (H) 5 - 15    Comment: Performed at [facility], [address] [place], [place] [address]  CBC WITH DIFFERENTIAL     Status: Abnormal   Collection Time: 09/11/23  9:38 PM  Result Value Ref Range   WBC 13.1 (H) 4.0 - 10.5 K/uL   RBC 2.86 (L) 3.87 - 5.11 MIL/uL   Hemoglobin 10.2 (L) 12.0 - 15.0 g/dL   HCT 27.0 (L) 62.3 - 76.2 %   MCV 101.7 (H) 80.0 - 100.0 fL   MCH 35.7 (H) 26.0 - 34.0 pg   MCHC 35.1 30.0 - 36.0 g/dL   RDW 83.1 (H) 51.7 - 61.6 %   Platelets 188 150 - 400 K/uL   nRBC 0.0 0.0 - 0.2 %   Neutrophils Relative % 79 %   Neutro Abs 10.3  (H) 1.7 - 7.7 K/uL   Lymphocytes Relative 15 %   Lymphs Abs 2.0 0.7 - 4.0 K/uL   Monocytes Relative 5 %   Monocytes Absolute 0.6 0.1 - 1.0 K/uL   Eosinophils Relative 0 %   Eosinophils Absolute 0.0 0.0 - 0.5 K/uL   Basophils Relative 0 %   Basophils Absolute 0.0 0.0 - 0.1 K/uL   Immature Granulocytes 1 %   Abs Immature Granulocytes 0.07 0.00 - 0.07 K/uL    Comment: Performed at [facility], [address] [place], [place] [address]  Magnesium     Status: Abnormal   Collection Time: 09/11/23  9:38 PM  Result Value Ref Range   Magnesium 1.1 (L) 1.7 - 2.4 mg/dL    Comment: Performed at [facility], [address], [place], [place] [address]  hCG, serum, qualitative     Status: None   Collection Time: 09/11/23  9:38 PM  Result Value Ref Range   Preg, Serum NEGATIVE NEGATIVE    Comment:        THE SENSITIVITY OF THIS METHODOLOGY IS >10 mIU/mL. Performed at [facility], [address], [place], [place] [address]   Salicylate level     Status: Abnormal   Collection Time: 09/11/23  9:54 PM  Result Value Ref Range   Salicylate Lvl <7.0 (L) 7.0 - 30.0 mg/dL    Comment: Performed at [facility], [address], [place], [place] [address]  Ethanol     Status: Abnormal   Collection Time: 09/11/23  9:54 PM  Result Value Ref Range   Alcohol, Ethyl (B) 193 (H) <10 mg/dL    Comment: (NOTE) Lowest detectable limit for serum alcohol is 10 mg/dL.  For medical purposes only. Performed at [facility], [address], [place], [place] [address]   CBG monitoring, ED     Status: Abnormal   Collection Time: 09/11/23 10:09 PM  Result Value Ref Range   Glucose-Capillary 123 (H) 70 - 99 mg/dL    Comment: Glucose reference range applies only to samples taken after fasting for at least 8 hours.   Comment 1 Notify RN   Urine rapid drug screen (hosp performed)     Status: Abnormal   Collection Time:  09/11/23 10:52 PM  Result Value Ref Range   Opiates  NONE DETECTED NONE DETECTED   Cocaine NONE DETECTED NONE DETECTED   Benzodiazepines NONE DETECTED NONE DETECTED   Amphetamines NONE DETECTED NONE DETECTED   Tetrahydrocannabinol POSITIVE (A) NONE DETECTED   Barbiturates NONE DETECTED NONE DETECTED    Comment: (NOTE) DRUG SCREEN FOR MEDICAL PURPOSES ONLY.  IF CONFIRMATION IS NEEDED FOR ANY PURPOSE, NOTIFY LAB WITHIN 5 DAYS.  LOWEST DETECTABLE LIMITS FOR URINE DRUG SCREEN Drug Class                     Cutoff (ng/mL) Amphetamine and metabolites    1000 Barbiturate and metabolites    200 Benzodiazepine                 200 Opiates and metabolites        300 Cocaine and metabolites        300 THC                            50 Performed at [facility], [address] [place], [place] [address]   Acetaminophen level     Status: Abnormal   Collection Time: 09/11/23 11:11 PM  Result Value Ref Range   Acetaminophen (Tylenol), Serum <10 (L) 10 - 30 ug/mL    Comment: (NOTE) Therapeutic concentrations vary significantly. A range of 10-30 ug/mL  may be an effective concentration for many patients. However, some  are best treated at concentrations outside of this range. Acetaminophen concentrations >150 ug/mL at 4 hours after ingestion  and >50 ug/mL at 12 hours after ingestion are often associated with  toxic reactions.  Performed at [facility], [address] [place], [place] [address]   Blood gas, arterial     Status: Abnormal   Collection Time: 09/11/23 11:58 PM  Result Value Ref Range   O2 Content ROOM AIR L/min   pH, Arterial 7.46 (H) 7.35 - 7.45   pCO2 arterial 43 32 - 48 mmHg   pO2, Arterial 77 (L) 83 - 108 mmHg   Bicarbonate 30.6 (H) 20.0 - 28.0 mmol/L   Acid-Base Excess 6.0 (H) 0.0 - 2.0 mmol/L   O2 Saturation 94.3 %   Patient temperature 36.9    Collection site RIGHT RADIAL    Drawn by [id number]    Allens test (pass/fail) PASS  PASS    Comment: Performed at [facility], [address] [place], [place] [address]  Magnesium     Status: Abnormal   Collection Time: 09/12/23  5:43 AM  Result Value Ref Range   Magnesium 1.5 (L) 1.7 - 2.4 mg/dL    Comment: Performed at [facility], [address], [place], [place] [address]  Acetaminophen level     Status: Abnormal   Collection Time: 09/12/23  5:43 AM  Result Value Ref Range   Acetaminophen (Tylenol), Serum <10 (L) 10 - 30 ug/mL    Comment: (NOTE) Therapeutic concentrations vary significantly. A range of 10-30 ug/mL  may be an effective concentration for many patients. However, some  are best treated at concentrations outside of this range. Acetaminophen concentrations >150 ug/mL at 4 hours after ingestion  and >50 ug/mL at 12 hours after ingestion are often associated with  toxic reactions.  Performed at [facility], [address], [place], [place] [address]   Hepatic function panel     Status: Abnormal   Collection Time: 09/12/23  5:43 AM  Result  Value Ref Range   Total Protein 6.1 (L) 6.5 - 8.1 g/dL   Albumin 3.0 (L) 3.5 - 5.0 g/dL   AST 14 (L) 15 - 41 U/L   ALT <5 0 - 44 U/L   Alkaline Phosphatase 67 38 - 126 U/L   Total Bilirubin 0.8 <1.2 mg/dL   Bilirubin, Direct 0.1 0.0 - 0.2 mg/dL   Indirect Bilirubin 0.7 0.3 - 0.9 mg/dL    Comment: Performed at [facility], [address], [place], [place] [address]  VITAMIN D 25 Hydroxy (Vit-D Deficiency, Fractures)     Status: Abnormal   Collection Time: 09/12/23  5:43 AM  Result Value Ref Range   Vit D, 25-Hydroxy 109.82 (H) 30 - 100 ng/mL    Comment: (NOTE) Vitamin D deficiency has been defined by the Institute of Medicine  and an Endocrine Society practice guideline as a level of serum 25-OH  vitamin D less than 20 ng/mL (1,2). The Endocrine Society went on to  further define vitamin D insufficiency as a level between 21 and 29   ng/mL (2).  1. IOM (Institute of Medicine). 2010. Dietary reference intakes for  calcium and D. [place]: [employer]. 2. Holick MF, Binkley Lyle, Bischoff-Ferrari HA, et al. Evaluation,  treatment, and prevention of vitamin D deficiency: an Endocrine  Society clinical practice guideline, JCEM. 2011 Jul; 96(7): 1911-30.  Performed at [facility], [address] [place], [place] [address]   Basic metabolic panel     Status: Abnormal   Collection Time: 09/12/23  5:43 AM  Result Value Ref Range   Sodium 137 135 - 145 mmol/L   Potassium 2.9 (L) 3.5 - 5.1 mmol/L   Chloride 95 (L) 98 - 111 mmol/L   CO2 31 22 - 32 mmol/L   Glucose, Bld 84 70 - 99 mg/dL    Comment: Glucose reference range applies only to samples taken after fasting for at least 8 hours.   BUN <5 (L) 6 - 20 mg/dL   Creatinine, Ser 6.96 (L) 0.44 - 1.00 mg/dL   Calcium 7.8 (L) 8.9 - 10.3 mg/dL   GFR, Estimated >29 >52 mL/min    Comment: (NOTE) Calculated using the CKD-EPI Creatinine Equation (2021)    Anion gap 11 5 - 15    Comment: Performed at [facility], [address] [place], [place] [address]  Urinalysis, Routine w reflex microscopic -Urine, Clean Catch     Status: Abnormal   Collection Time: 09/12/23  5:43 AM  Result Value Ref Range   Color, Urine COLORLESS (A) YELLOW   APPearance CLEAR CLEAR   Specific Gravity, Urine 1.002 (L) 1.005 - 1.030   pH 6.0 5.0 - 8.0   Glucose, UA NEGATIVE NEGATIVE mg/dL   Hgb urine dipstick SMALL (A) NEGATIVE   Bilirubin Urine NEGATIVE NEGATIVE   Ketones, ur NEGATIVE NEGATIVE mg/dL   Protein, ur NEGATIVE NEGATIVE mg/dL   Nitrite NEGATIVE NEGATIVE   Leukocytes,Ua NEGATIVE NEGATIVE   RBC / HPF 0-5 0 - 5 RBC/hpf   WBC, UA 0-5 0 - 5 WBC/hpf   Bacteria, UA RARE (A) NONE SEEN   Squamous Epithelial / HPF 0-5 0 - 5 /HPF    Comment: Performed at [facility], [address], [place], [place] [address]  Vitamin B12      Status: None   Collection Time: 09/12/23  5:43 AM  Result Value Ref Range   Vitamin B-12 191 180 - 914  pg/mL    Comment: (NOTE) This assay is not validated for testing neonatal or myeloproliferative syndrome specimens for Vitamin B12 levels. Performed at [facility], [address] [place], [place] [address]   Respiratory (~20 pathogens) panel by PCR     Status: None   Collection Time: 09/12/23  8:58 AM   Specimen: Anterior Nasal Swab; Respiratory  Result Value Ref Range   Adenovirus NOT DETECTED NOT DETECTED   Coronavirus 229E NOT DETECTED NOT DETECTED    Comment: (NOTE) The Coronavirus on the Respiratory Panel, DOES NOT test for the novel  Coronavirus (2019 nCoV)    Coronavirus HKU1 NOT DETECTED NOT DETECTED   Coronavirus NL63 NOT DETECTED NOT DETECTED   Coronavirus OC43 NOT DETECTED NOT DETECTED   Metapneumovirus NOT DETECTED NOT DETECTED   Rhinovirus / Enterovirus NOT DETECTED NOT DETECTED   Influenza A NOT DETECTED NOT DETECTED   Influenza B NOT DETECTED NOT DETECTED   Parainfluenza Virus 1 NOT DETECTED NOT DETECTED   Parainfluenza Virus 2 NOT DETECTED NOT DETECTED   Parainfluenza Virus 3 NOT DETECTED NOT DETECTED   Parainfluenza Virus 4 NOT DETECTED NOT DETECTED   Respiratory Syncytial Virus NOT DETECTED NOT DETECTED   Bordetella pertussis NOT DETECTED NOT DETECTED   Bordetella Parapertussis NOT DETECTED NOT DETECTED   Chlamydophila pneumoniae NOT DETECTED NOT DETECTED   Mycoplasma pneumoniae NOT DETECTED NOT DETECTED    Comment: Performed at [facility], [address], [place], [place] [address]  C Difficile Quick Screen w PCR reflex     Status: Abnormal   Collection Time: 09/12/23  9:22 AM   Specimen: Stool  Result Value Ref Range   C Diff antigen POSITIVE (A) NEGATIVE   C Diff toxin NEGATIVE NEGATIVE   C Diff interpretation Results are indeterminate. See PCR results.     Comment: Performed at [facility], [address], [place], [place] [address]  Acetaminophen level     Status: Abnormal   Collection Time: 09/12/23  9:22 AM  Result Value Ref Range   Acetaminophen (Tylenol), Serum <10 (L) 10 - 30 ug/mL    Comment: (NOTE) Therapeutic concentrations vary significantly. A range of 10-30 ug/mL  may be an effective concentration for many patients. However, some  are best treated at concentrations outside of this range. Acetaminophen concentrations >150 ug/mL at 4 hours after ingestion  and >50 ug/mL at 12 hours after ingestion are often associated with  toxic reactions.  Performed at [facility], [address], [place], [place] [address]   CBC with Differential/Platelet     Status: Abnormal   Collection Time: 09/12/23  9:22 AM  Result Value Ref Range   WBC 9.4 4.0 - 10.5 K/uL   RBC 2.61 (L) 3.87 - 5.11 MIL/uL   Hemoglobin 8.9 (L) 12.0 - 15.0 g/dL   HCT 13.0 (L) 86.5 - 78.4 %   MCV 106.5 (H) 80.0 - 100.0 fL   MCH 34.1 (H) 26.0 - 34.0 pg   MCHC 32.0 30.0 - 36.0 g/dL   RDW 69.6 (H) 29.5 - 28.4 %   Platelets 181 150 - 400 K/uL   nRBC 0.0 0.0 - 0.2 %   Neutrophils Relative % 82 %   Neutro Abs 7.7 1.7 - 7.7 K/uL   Lymphocytes Relative 13 %   Lymphs Abs 1.2 0.7 - 4.0 K/uL   Monocytes Relative 4 %   Monocytes Absolute 0.4 0.1 - 1.0 K/uL   Eosinophils Relative 0 %  Eosinophils Absolute 0.0 0.0 - 0.5 K/uL   Basophils Relative 0 %   Basophils Absolute 0.0 0.0 - 0.1 K/uL   Immature Granulocytes 1 %   Abs Immature Granulocytes 0.06 0.00 - 0.07 K/uL    Comment: Performed at [facility], [address], [place], [place] [address]   DG Chest Port 1 View  Result Date: 09/11/2023 CLINICAL DATA:  Hypoxia EXAM: PORTABLE CHEST 1 VIEW COMPARISON:  04/25/2021 FINDINGS: The heart size and mediastinal contours are within normal limits. Both lungs are clear. The visualized skeletal structures are unremarkable. IMPRESSION: No active disease. Electronically Signed   By:  Jasmine Pang M.D.   On: 09/11/2023 23:52    Pending Labs Unresulted Labs (From admission, onward)     Start     Ordered   09/13/23 0500  Basic metabolic panel  Daily,   R     Question:  Specimen collection method  Answer:  Lab=Lab collect   09/12/23 0823   09/13/23 0500  Magnesium  Daily,   R     Question:  Specimen collection method  Answer:  Lab=Lab collect   09/12/23 0823   09/12/23 0922  C. Diff by PCR, Reflexed  Once,   TIMED        09/12/23 0922   09/12/23 0851  Folate  Add-on,   AD        09/12/23 0850   09/12/23 0849  SARS Coronavirus 2 by RT PCR (hospital order, performed in [facility] hospital lab) *cepheid single result test* Anterior Nasal Swab  (Tier 2 - SARS Coronavirus 2 by RT PCR (hospital order, performed in [facility] hospital lab) *cepheid single result test*)  Once,   R        09/12/23 0849   09/12/23 0514  Acetaminophen level  Now then every 4 hours,   R (with TIMED occurrences)      09/12/23 0513   09/11/23 2351  Gastrointestinal Panel by PCR , Stool  (Gastrointestinal Panel by PCR, Stool                                                                                                                                                     **Does Not include CLOSTRIDIUM DIFFICILE testing. **If CDIFF testing is needed, place order from the "C Difficile Testing" order set.**)  Once,   R        09/11/23 2350   09/11/23 2348  HIV Antibody (routine testing w rflx)  (HIV Antibody (Routine testing w reflex) panel)  Once,   R        09/11/23 2350            Vitals/Pain Today's Vitals   09/12/23 1315 09/12/23 1330 09/12/23 1345 09/12/23 1400  BP: 115/82 120/85 124/89 (!) 118/90  Pulse: 90 88 85 94  Resp: 16 17 13 13   Temp:      TempSrc:      SpO2: 99% 100% 99% 100%  Weight:      Height:      PainSc:        Isolation Precautions Droplet precaution  Medications Medications  lactated ringers infusion (0 mLs Intravenous Stopped 09/12/23 1110)  enoxaparin  (LOVENOX) injection 40 mg (40 mg Subcutaneous Given 09/12/23 1111)  sodium chloride flush (NS) 0.9 % injection 3 mL (3 mLs Intravenous Not Given 09/12/23 1038)  sodium chloride flush (NS) 0.9 % injection 3 mL (has no administration in time range)  0.9 %  sodium chloride infusion (has no administration in time range)  levalbuterol (XOPENEX) nebulizer solution 0.63 mg (has no administration in time range)  acetaminophen (TYLENOL) tablet 650 mg (has no administration in time range)    Or  acetaminophen (TYLENOL) suppository 650 mg (has no administration in time range)  naloxone [facility]) injection 0.4 mg (has no administration in time range)  thiamine (VITAMIN B1) tablet 100 mg (100 mg Oral Given 09/12/23 1111)    Or  thiamine (VITAMIN B1) injection 100 mg ( Intravenous See Alternative 09/12/23 1111)  folic acid (FOLVITE) tablet 1 mg (1 mg Oral Given 09/12/23 1111)  multivitamin with minerals tablet 1 tablet (1 tablet Oral Given 09/12/23 1111)  levothyroxine (SYNTHROID) tablet 25 mcg (25 mcg Oral Given 09/12/23 0550)  lactated ringers bolus 1,000 mL (0 mLs Intravenous Stopped 09/12/23 0023)  potassium chloride 10 mEq in 100 mL IVPB (0 mEq Intravenous Stopped 09/12/23 0725)  magnesium sulfate IVPB 2 g 50 mL (0 g Intravenous Stopped 09/11/23 2353)  calcium gluconate 1 g/ 50 mL sodium chloride IVPB (0 mg Intravenous Stopped 09/12/23 0121)  potassium chloride 10 mEq in 100 mL IVPB (0 mEq Intravenous Stopped 09/12/23 0942)  magnesium sulfate IVPB 2 g 50 mL (0 g Intravenous Stopped 09/12/23 1001)    Mobility walks

## 2023-09-12 NOTE — Assessment & Plan Note (Signed)
Replete as needed

## 2023-09-12 NOTE — ED Notes (Signed)
Swallow screen complete, pt passed. Pt able to drink/swallow without difficulty. MD aware.

## 2023-09-12 NOTE — Assessment & Plan Note (Signed)
-   Follow-up GI pathogen panel - Follow-up C. difficile PCR.  Currently Ag positive but toxin negative

## 2023-09-12 NOTE — Assessment & Plan Note (Signed)
-   check covid and RVP swabs

## 2023-09-12 NOTE — Progress Notes (Signed)
Progress Note    Alexandra Dorsey   HQI:[id number]  DOB: January 22, 1976  DOA: 09/11/2023     1 PCP: Wilfrid Lund, PA  Initial CC: found unresponsive   Hospital Course: Alexandra Dorsey is a 47 yo female with PMH alcohol abuse, HTN, anxiety who presented after being found unresponsive by EMS. She has had ongoing cough for several days and presented to her PCP for evaluation and was given prescription cough medication.  She then apparently took her Seroquel, trazodone, and also drink alcohol. Appears that she lost consciousness and had vomited.  She was found in a pool of emesis and hypoxic, 82% by EMS.  They were called by her significant other who had witnessed the event. Due to some concern for overdose, poison control was also called on admission. Patient had stated multiple times she had not overdosed intentionally nor did she have any suicidal thoughts or ideation. CXR was clear on admission.  She was continued on oxygen. She was afebrile with leukocytosis on admission which normalized without antibiotics.  Interval History:  Awake and alert in the ER this morning.  Does not remember the events at home or how she got to the ER.  Adamantly denies any suicidal thoughts or intent.  Was not attempting to overdose.  Mostly states her cough has been incessant and she was attempting to take enough cough syrup to help suppress. Counseled her on the dangers of mixing cough medicine with alcohol use.  Assessment and Plan: * Drug overdose, accidental or unintentional, initial encounter - Patient drink more than prescribed cough syrup along with also taking her scheduled trazodone, Seroquel.  She then proceeded to also consume alcohol.  Combination likely accounting for her unresponsive episode -She denies suicidal intent or ideation.  Overdose nonintentional.  Do not feel patient indicates having sitter or psychiatry evaluation -Have strongly counseled patient on dangers of combination of  medications and alcohol - poison control involved; serial EKGs and labs as needed - hold sedating agents for at least today  Acute hypoxic respiratory failure (HCC) - no obvious signs of aspiration of CXR - WBC has normalized without antibiotics and remains afebrile - Continue trending vitals and labs - Wean oxygen as able  Cough - check covid and RVP swabs  Nausea, vomiting and diarrhea - Follow-up GI pathogen panel - Follow-up C. difficile PCR.  Currently Ag positive but toxin negative   Acute metabolic encephalopathy-resolved as of 09/12/2023 - Due to medication side effects and accumulation - Patient awake, alert, and oriented this morning.  Back to normal baseline  Hyponatremia - Very mild and asymptomatic - Continue fluids - BMP in a.m.  Hypomagnesemia - Replete as needed  Hypokalemia - Replete as needed  QT prolongation - serial EKGs - replete electrolytes as needed  Chronic alcohol use - Ethanol level 193 on admission - Monitor for any developing signs of withdrawal  Hypothyroidism - Continue Synthroid  Generalized anxiety disorder - On chronic Klonopin.  Verified on database - Holding for now in setting of overdose and encephalopathy on admission  Essential hypertension - BP normal -Hold home meds for now   Old records reviewed in assessment of this patient  Antimicrobials:   DVT prophylaxis:  enoxaparin (LOVENOX) injection 40 mg Start: 09/12/23 1000 SCDs Start: 09/11/23 2349   Code Status:   Code Status: Full Code  Mobility Assessment (Last 72 Hours)     Mobility Assessment   No documentation.  Barriers to discharge: none Disposition Plan:  Home Status is: Inpt  Objective: Blood pressure 103/74, pulse 98, temperature 98.2 F (36.8 C), resp. rate 20, height 5\' 5"  (1.651 m), weight 55 kg, SpO2 95%.  Examination:  Physical Exam Constitutional:      Appearance: Normal appearance.  HENT:     Head: Normocephalic and  atraumatic.     Mouth/Throat:     Mouth: Mucous membranes are moist.  Eyes:     Extraocular Movements: Extraocular movements intact.  Cardiovascular:     Rate and Rhythm: Normal rate and regular rhythm.  Pulmonary:     Effort: Pulmonary effort is normal. No respiratory distress.     Breath sounds: Normal breath sounds. No wheezing.  Abdominal:     General: Bowel sounds are normal. There is no distension.     Palpations: Abdomen is soft.     Tenderness: There is no abdominal tenderness.  Musculoskeletal:        General: Normal range of motion.     Cervical back: Normal range of motion and neck supple.  Skin:    General: Skin is warm and dry.  Neurological:     General: No focal deficit present.     Mental Status: She is alert.  Psychiatric:        Mood and Affect: Mood normal.      Consultants:    Procedures:    Data Reviewed: Results for orders placed or performed during the hospital encounter of 09/11/23 (from the past 24 hour(s))  Comprehensive metabolic panel     Status: Abnormal   Collection Time: 09/11/23  9:38 PM  Result Value Ref Range   Sodium 132 (L) 135 - 145 mmol/L   Potassium <2.0 (LL) 3.5 - 5.1 mmol/L   Chloride 90 (L) 98 - 111 mmol/L   CO2 24 22 - 32 mmol/L   Glucose, Bld 130 (H) 70 - 99 mg/dL   BUN 5 (L) 6 - 20 mg/dL   Creatinine, Ser 5.40 0.44 - 1.00 mg/dL   Calcium 7.2 (L) 8.9 - 10.3 mg/dL   Total Protein 7.0 6.5 - 8.1 g/dL   Albumin 3.5 3.5 - 5.0 g/dL   AST 14 (L) 15 - 41 U/L   ALT 5 0 - 44 U/L   Alkaline Phosphatase 72 38 - 126 U/L   Total Bilirubin 0.6 <1.2 mg/dL   GFR, Estimated >98 >11 mL/min   Anion gap 18 (H) 5 - 15  CBC WITH DIFFERENTIAL     Status: Abnormal   Collection Time: 09/11/23  9:38 PM  Result Value Ref Range   WBC 13.1 (H) 4.0 - 10.5 K/uL   RBC 2.86 (L) 3.87 - 5.11 MIL/uL   Hemoglobin 10.2 (L) 12.0 - 15.0 g/dL   HCT 91.4 (L) 78.2 - 95.6 %   MCV 101.7 (H) 80.0 - 100.0 fL   MCH 35.7 (H) 26.0 - 34.0 pg   MCHC 35.1 30.0 -  36.0 g/dL   RDW 21.3 (H) 08.6 - 57.8 %   Platelets 188 150 - 400 K/uL   nRBC 0.0 0.0 - 0.2 %   Neutrophils Relative % 79 %   Neutro Abs 10.3 (H) 1.7 - 7.7 K/uL   Lymphocytes Relative 15 %   Lymphs Abs 2.0 0.7 - 4.0 K/uL   Monocytes Relative 5 %   Monocytes Absolute 0.6 0.1 - 1.0 K/uL   Eosinophils Relative 0 %   Eosinophils Absolute 0.0 0.0 - 0.5 K/uL   Basophils Relative  0 %   Basophils Absolute 0.0 0.0 - 0.1 K/uL   Immature Granulocytes 1 %   Abs Immature Granulocytes 0.07 0.00 - 0.07 K/uL  Magnesium     Status: Abnormal   Collection Time: 09/11/23  9:38 PM  Result Value Ref Range   Magnesium 1.1 (L) 1.7 - 2.4 mg/dL  hCG, serum, qualitative     Status: None   Collection Time: 09/11/23  9:38 PM  Result Value Ref Range   Preg, Serum NEGATIVE NEGATIVE  Salicylate level     Status: Abnormal   Collection Time: 09/11/23  9:54 PM  Result Value Ref Range   Salicylate Lvl <7.0 (L) 7.0 - 30.0 mg/dL  Ethanol     Status: Abnormal   Collection Time: 09/11/23  9:54 PM  Result Value Ref Range   Alcohol, Ethyl (B) 193 (H) <10 mg/dL  CBG monitoring, ED     Status: Abnormal   Collection Time: 09/11/23 10:09 PM  Result Value Ref Range   Glucose-Capillary 123 (H) 70 - 99 mg/dL   Comment 1 Notify RN   Urine rapid drug screen (hosp performed)     Status: Abnormal   Collection Time: 09/11/23 10:52 PM  Result Value Ref Range   Opiates NONE DETECTED NONE DETECTED   Cocaine NONE DETECTED NONE DETECTED   Benzodiazepines NONE DETECTED NONE DETECTED   Amphetamines NONE DETECTED NONE DETECTED   Tetrahydrocannabinol POSITIVE (A) NONE DETECTED   Barbiturates NONE DETECTED NONE DETECTED  Acetaminophen level     Status: Abnormal   Collection Time: 09/11/23 11:11 PM  Result Value Ref Range   Acetaminophen (Tylenol), Serum <10 (L) 10 - 30 ug/mL  Blood gas, arterial     Status: Abnormal   Collection Time: 09/11/23 11:58 PM  Result Value Ref Range   O2 Content ROOM AIR L/min   pH, Arterial 7.46 (H)  7.35 - 7.45   pCO2 arterial 43 32 - 48 mmHg   pO2, Arterial 77 (L) 83 - 108 mmHg   Bicarbonate 30.6 (H) 20.0 - 28.0 mmol/L   Acid-Base Excess 6.0 (H) 0.0 - 2.0 mmol/L   O2 Saturation 94.3 %   Patient temperature 36.9    Collection site RIGHT RADIAL    Drawn by [id number]    Allens test (pass/fail) PASS PASS  Magnesium     Status: Abnormal   Collection Time: 09/12/23  5:43 AM  Result Value Ref Range   Magnesium 1.5 (L) 1.7 - 2.4 mg/dL  Acetaminophen level     Status: Abnormal   Collection Time: 09/12/23  5:43 AM  Result Value Ref Range   Acetaminophen (Tylenol), Serum <10 (L) 10 - 30 ug/mL  Hepatic function panel     Status: Abnormal   Collection Time: 09/12/23  5:43 AM  Result Value Ref Range   Total Protein 6.1 (L) 6.5 - 8.1 g/dL   Albumin 3.0 (L) 3.5 - 5.0 g/dL   AST 14 (L) 15 - 41 U/L   ALT <5 0 - 44 U/L   Alkaline Phosphatase 67 38 - 126 U/L   Total Bilirubin 0.8 <1.2 mg/dL   Bilirubin, Direct 0.1 0.0 - 0.2 mg/dL   Indirect Bilirubin 0.7 0.3 - 0.9 mg/dL  VITAMIN D 25 Hydroxy (Vit-D Deficiency, Fractures)     Status: Abnormal   Collection Time: 09/12/23  5:43 AM  Result Value Ref Range   Vit D, 25-Hydroxy 109.82 (H) 30 - 100 ng/mL  Basic metabolic panel     Status: Abnormal  Collection Time: 09/12/23  5:43 AM  Result Value Ref Range   Sodium 137 135 - 145 mmol/L   Potassium 2.9 (L) 3.5 - 5.1 mmol/L   Chloride 95 (L) 98 - 111 mmol/L   CO2 31 22 - 32 mmol/L   Glucose, Bld 84 70 - 99 mg/dL   BUN <5 (L) 6 - 20 mg/dL   Creatinine, Ser 7.82 (L) 0.44 - 1.00 mg/dL   Calcium 7.8 (L) 8.9 - 10.3 mg/dL   GFR, Estimated >95 >62 mL/min   Anion gap 11 5 - 15  Urinalysis, Routine w reflex microscopic -Urine, Clean Catch     Status: Abnormal   Collection Time: 09/12/23  5:43 AM  Result Value Ref Range   Color, Urine COLORLESS (A) YELLOW   APPearance CLEAR CLEAR   Specific Gravity, Urine 1.002 (L) 1.005 - 1.030   pH 6.0 5.0 - 8.0   Glucose, UA NEGATIVE NEGATIVE mg/dL   Hgb urine  dipstick SMALL (A) NEGATIVE   Bilirubin Urine NEGATIVE NEGATIVE   Ketones, ur NEGATIVE NEGATIVE mg/dL   Protein, ur NEGATIVE NEGATIVE mg/dL   Nitrite NEGATIVE NEGATIVE   Leukocytes,Ua NEGATIVE NEGATIVE   RBC / HPF 0-5 0 - 5 RBC/hpf   WBC, UA 0-5 0 - 5 WBC/hpf   Bacteria, UA RARE (A) NONE SEEN   Squamous Epithelial / HPF 0-5 0 - 5 /HPF  Vitamin B12     Status: None   Collection Time: 09/12/23  5:43 AM  Result Value Ref Range   Vitamin B-12 191 180 - 914 pg/mL  C Difficile Quick Screen w PCR reflex     Status: Abnormal   Collection Time: 09/12/23  9:22 AM   Specimen: Stool  Result Value Ref Range   C Diff antigen POSITIVE (A) NEGATIVE   C Diff toxin NEGATIVE NEGATIVE   C Diff interpretation Results are indeterminate. See PCR results.   Acetaminophen level     Status: Abnormal   Collection Time: 09/12/23  9:22 AM  Result Value Ref Range   Acetaminophen (Tylenol), Serum <10 (L) 10 - 30 ug/mL  CBC with Differential/Platelet     Status: Abnormal   Collection Time: 09/12/23  9:22 AM  Result Value Ref Range   WBC 9.4 4.0 - 10.5 K/uL   RBC 2.61 (L) 3.87 - 5.11 MIL/uL   Hemoglobin 8.9 (L) 12.0 - 15.0 g/dL   HCT 13.0 (L) 86.5 - 78.4 %   MCV 106.5 (H) 80.0 - 100.0 fL   MCH 34.1 (H) 26.0 - 34.0 pg   MCHC 32.0 30.0 - 36.0 g/dL   RDW 69.6 (H) 29.5 - 28.4 %   Platelets 181 150 - 400 K/uL   nRBC 0.0 0.0 - 0.2 %   Neutrophils Relative % 82 %   Neutro Abs 7.7 1.7 - 7.7 K/uL   Lymphocytes Relative 13 %   Lymphs Abs 1.2 0.7 - 4.0 K/uL   Monocytes Relative 4 %   Monocytes Absolute 0.4 0.1 - 1.0 K/uL   Eosinophils Relative 0 %   Eosinophils Absolute 0.0 0.0 - 0.5 K/uL   Basophils Relative 0 %   Basophils Absolute 0.0 0.0 - 0.1 K/uL   Immature Granulocytes 1 %   Abs Immature Granulocytes 0.06 0.00 - 0.07 K/uL    I have reviewed pertinent nursing notes, vitals, labs, and images as necessary. I have ordered labwork to follow up on as indicated.  I have reviewed the last notes from staff  over past 24 hours.  I have discussed patient's care plan and test results with nursing staff, CM/SW, and other staff as appropriate.  Time spent: Greater than 50% of the 55 minute visit was spent in counseling/coordination of care for the patient as laid out in the A&P.   LOS: 1 day   Lewie Chamber, MD [facility] 09/12/2023, 1:36 PM

## 2023-09-12 NOTE — Assessment & Plan Note (Signed)
-   BP normal -Hold home meds for now

## 2023-09-12 NOTE — ED Notes (Signed)
Pt's oxygen decreased to 2L [place].  Will reassesses shortly.

## 2023-09-12 NOTE — ED Notes (Signed)
Sitter order entered d/t Pt taking an unknown amount of multiple different medications.

## 2023-09-12 NOTE — Assessment & Plan Note (Signed)
-   On chronic Klonopin.  Verified on database - Holding for now in setting of overdose and encephalopathy on admission

## 2023-09-12 NOTE — Hospital Course (Signed)
Alexandra Dorsey is a 47 yo female with PMH alcohol abuse, HTN, anxiety who presented after being found unresponsive by EMS. She has had ongoing cough for several days and presented to her PCP for evaluation and was given prescription cough medication.  She then apparently took her Seroquel, trazodone, and also drink alcohol. Appears that she lost consciousness and had vomited.  She was found in a pool of emesis and hypoxic, 82% by EMS.  They were called by her significant other who had witnessed the event. Due to some concern for overdose, poison control was also called on admission. Patient had stated multiple times she had not overdosed intentionally nor did she have any suicidal thoughts or ideation.CXR was clear on admission.  She was continued on oxygen. She was afebrile with leukocytosis on admission which normalized without antibiotics. Overnight afebrile BP stable Labs shows improving potassium 3.4 bicarb 34 CBC with hemoglobin up to 9.4 g, Tylenol level less than 10 x 2, LFTs remain normal.  The patient having some cough otherwise no new complaints denies any suicidal ideation or intent and doing well.  Poison control has signed off, she is now on room air at this time she is stable for discharge home

## 2023-09-12 NOTE — Assessment & Plan Note (Signed)
-   Due to medication side effects and accumulation - Patient awake, alert, and oriented this morning.  Back to normal baseline

## 2023-09-12 NOTE — ED Notes (Signed)
Pt's nasal cannula noted to not be in nose and O2 sat 100%.  Nasal cannula removed.

## 2023-09-12 NOTE — Assessment & Plan Note (Signed)
-   serial EKGs - replete electrolytes as needed

## 2023-09-12 NOTE — Assessment & Plan Note (Signed)
-   Patient drink more than prescribed cough syrup along with also taking her scheduled trazodone, Seroquel.  She then proceeded to also consume alcohol.  Combination likely accounting for her unresponsive episode -She denies suicidal intent or ideation.  Overdose nonintentional.  Do not feel patient indicates having sitter or psychiatry evaluation -Have strongly counseled patient on dangers of combination of medications and alcohol - poison control involved; serial EKGs and labs as needed - hold sedating agents for at least today

## 2023-09-12 NOTE — Assessment & Plan Note (Signed)
-   no obvious signs of aspiration of CXR - WBC has normalized without antibiotics and remains afebrile - Continue trending vitals and labs - Wean oxygen as able

## 2023-09-12 NOTE — ED Notes (Signed)
Pt's O2 noted to drop to 89-90% RA.  Pt placed on 3L [place].

## 2023-09-12 NOTE — ED Notes (Signed)
Poison control called for update on pt, recommended to continue current care orders, repeat EKG in 4 hours,  spoke with Caryn Bee.

## 2023-09-12 NOTE — Assessment & Plan Note (Signed)
- 

## 2023-09-12 NOTE — Assessment & Plan Note (Signed)
Continue Synthroid °

## 2023-09-12 NOTE — Assessment & Plan Note (Signed)
-   Very mild and asymptomatic - Continue fluids - BMP in a.m.

## 2023-09-12 NOTE — Assessment & Plan Note (Signed)
-   Ethanol level 193 on admission - Monitor for any developing signs of withdrawal

## 2023-09-13 DIAGNOSIS — T43211A Poisoning by selective serotonin and norepinephrine reuptake inhibitors, accidental (unintentional), initial encounter: Secondary | ICD-10-CM | POA: Diagnosis not present

## 2023-09-13 DIAGNOSIS — T5191XA Toxic effect of unspecified alcohol, accidental (unintentional), initial encounter: Secondary | ICD-10-CM | POA: Diagnosis not present

## 2023-09-13 DIAGNOSIS — J9601 Acute respiratory failure with hypoxia: Secondary | ICD-10-CM | POA: Diagnosis not present

## 2023-09-13 DIAGNOSIS — T484X1A Poisoning by expectorants, accidental (unintentional), initial encounter: Secondary | ICD-10-CM | POA: Diagnosis not present

## 2023-09-13 DIAGNOSIS — T50901A Poisoning by unspecified drugs, medicaments and biological substances, accidental (unintentional), initial encounter: Secondary | ICD-10-CM | POA: Diagnosis not present

## 2023-09-13 LAB — CBC WITH DIFFERENTIAL/PLATELET
Abs Immature Granulocytes: 0.04 10*3/uL (ref 0.00–0.07)
Basophils Absolute: 0 10*3/uL (ref 0.0–0.1)
Basophils Relative: 0 %
Eosinophils Absolute: 0 10*3/uL (ref 0.0–0.5)
Eosinophils Relative: 1 %
HCT: 28.5 % — ABNORMAL LOW (ref 36.0–46.0)
Hemoglobin: 9.4 g/dL — ABNORMAL LOW (ref 12.0–15.0)
Immature Granulocytes: 1 %
Lymphocytes Relative: 20 %
Lymphs Abs: 1.4 10*3/uL (ref 0.7–4.0)
MCH: 34.9 pg — ABNORMAL HIGH (ref 26.0–34.0)
MCHC: 33 g/dL (ref 30.0–36.0)
MCV: 105.9 fL — ABNORMAL HIGH (ref 80.0–100.0)
Monocytes Absolute: 0.3 10*3/uL (ref 0.1–1.0)
Monocytes Relative: 5 %
Neutro Abs: 5.2 10*3/uL (ref 1.7–7.7)
Neutrophils Relative %: 73 %
Platelets: 188 10*3/uL (ref 150–400)
RBC: 2.69 MIL/uL — ABNORMAL LOW (ref 3.87–5.11)
RDW: 16.1 % — ABNORMAL HIGH (ref 11.5–15.5)
WBC: 7 10*3/uL (ref 4.0–10.5)
nRBC: 0 % (ref 0.0–0.2)

## 2023-09-13 LAB — HEPATIC FUNCTION PANEL
ALT: 6 U/L (ref 0–44)
AST: 17 U/L (ref 15–41)
Albumin: 3.1 g/dL — ABNORMAL LOW (ref 3.5–5.0)
Alkaline Phosphatase: 78 U/L (ref 38–126)
Bilirubin, Direct: <0.1 mg/dL (ref 0.0–0.2)
Total Bilirubin: 0.3 mg/dL (ref <1.2)
Total Protein: 6.2 g/dL — ABNORMAL LOW (ref 6.5–8.1)

## 2023-09-13 LAB — BASIC METABOLIC PANEL
Anion gap: 10 (ref 5–15)
BUN: 5 mg/dL — ABNORMAL LOW (ref 6–20)
CO2: 34 mmol/L — ABNORMAL HIGH (ref 22–32)
Calcium: 8.1 mg/dL — ABNORMAL LOW (ref 8.9–10.3)
Chloride: 91 mmol/L — ABNORMAL LOW (ref 98–111)
Creatinine, Ser: 0.32 mg/dL — ABNORMAL LOW (ref 0.44–1.00)
GFR, Estimated: >60 mL/min (ref >60)
Glucose, Bld: 102 mg/dL — ABNORMAL HIGH (ref 70–99)
Potassium: 3.4 mmol/L — ABNORMAL LOW (ref 3.5–5.1)
Sodium: 135 mmol/L (ref 135–145)

## 2023-09-13 LAB — MAGNESIUM: Magnesium: 1.8 mg/dL (ref 1.7–2.4)

## 2023-09-13 LAB — ACETAMINOPHEN LEVEL: Acetaminophen (Tylenol), Serum: <10 ug/mL — ABNORMAL LOW (ref 10–30)

## 2023-09-13 MED ORDER — ORAL CARE MOUTH RINSE: 15.0000 mL | OROMUCOSAL | Status: DC | PRN

## 2023-09-13 MED ORDER — BENZONATATE 100 MG PO CAPS
100.0000 mg | ORAL_CAPSULE | Freq: Once | ORAL | Status: AC
  Administered 2023-09-13: 100 mg via ORAL
  Filled 2023-09-13: qty 1

## 2023-09-13 MED ORDER — DM-GUAIFENESIN ER 30-600 MG PO TB12
1.0000 | ORAL_TABLET | Freq: Two times a day (BID) | ORAL | Status: DC
  Administered 2023-09-13: 1 via ORAL
  Filled 2023-09-13: qty 1

## 2023-09-13 NOTE — TOC CM/SW Note (Signed)
Patient will f/u with own resources.No further CM needs.

## 2023-09-13 NOTE — Discharge Summary (Signed)
Physician Discharge Summary  Alexandra Dorsey MWU:[id number] DOB: [birth year] DOA: 09/11/2023  PCP: Alexandra Lund, PA  Admit date: 09/11/2023 Discharge date: 09/13/2023 Recommendations for Outpatient Follow-up:  Follow up with PCP in 1 weeks-call for appointment Please obtain BMP/CBC in one week  Discharge Dispo: home Discharge Condition: Stable Code Status:   Code Status: Full Code Diet recommendation:  Diet Order             Diet regular Fluid consistency: Thin  Diet effective now                    Brief/Interim Summary: [name] is a 47 yo female with PMH alcohol abuse, HTN, anxiety who presented after being found unresponsive by EMS. She has had ongoing cough for several days and presented to her PCP for evaluation and was given prescription cough medication.  She then apparently took her Seroquel, trazodone, and also drink alcohol. Appears that she lost consciousness and had vomited.  She was found in a pool of emesis and hypoxic, 82% by EMS.  They were called by her significant other who had witnessed the event. Due to some concern for overdose, poison control was also called on admission. Patient had stated multiple times she had not overdosed intentionally nor did she have any suicidal thoughts or ideation.CXR was clear on admission.  She was continued on oxygen. She was afebrile with leukocytosis on admission which normalized without antibiotics. Overnight afebrile BP stable Labs shows improving potassium 3.4 bicarb 34 CBC with hemoglobin up to 9.4 g, Tylenol level less than 10 x 2, LFTs remain normal.  The patient having some cough otherwise no new complaints denies any suicidal ideation or intent and doing well.  Poison control has signed off, she is now on room air at this time she is stable for discharge home    Discharge Diagnoses:  Principal Problem:   Drug overdose, accidental or unintentional, initial encounter Active Problems:   Acute hypoxic  respiratory failure (HCC)   Nausea, vomiting and diarrhea   Cough   Hyponatremia   Hypokalemia   Hypomagnesemia   QT prolongation   Chronic alcohol use   Essential hypertension   Tetrahydrocannabinol (THC) use disorder, mild, abuse   Chronic anemia   Generalized anxiety disorder   Hypothyroidism   --- Drug overdose, accidental or unintentional, initial encounter: patient drink more than prescribed cough syrup along with also taking her scheduled trazodone, Seroquel.  She then proceeded to also consume alcohol. Combination likely accounting for her unresponsive episode Patient denies any suicidal ideation or intent and no depression.  Strongly encouraged to take medication as prescribed.  She is alert awake oriented x 3 and doing well.  Poison control has followed and at this time we will call PC o make sure no further recommendation Tylenol level normal   Acute hypoxic respiratory failure  Chest x-ray unremarkable no fever or leukocytosis today wean off oxygen, needs sleep apnea evaluation likely the cause   Cough RVP negative . Cont antitussives    Nausea, vomiting and diarrhea C. difficile antigen positive but toxin negative-subsequent C. difficile PCR negative   Acute metabolic encephalopathy-resolved as of 09/12/2023   Hyponatremia Resolved   Hypomagnesemia Resolved   Hypokalemia Resolved   QT prolongation EKG 11/27 AM shows QTc 482 improved   Chronic alcohol use:  Ethanol level 193 on admission. Aaox3.  No signs of withdrawal   Hypothyroidism Continue Synthroid   Generalized anxiety disorder Resume her home meds as  able   Essential hypertension - BP normal   Consults: Poison control Subjective: Alert awake oriented not depressed, not having any suicidal ideation  Discharge Exam: Vitals:   09/13/23 0745 09/13/23 1324  BP: 105/76 113/88  Pulse: 88 94  Resp: 16 18  Temp: 98.8 F (37.1 C) 98.7 F (37.1 C)  SpO2: 91% 91%   General: Pt is alert,  awake, not in acute distress Cardiovascular: RRR, S1/S2 +, no rubs, no gallops Respiratory: CTA bilaterally, no wheezing, no rhonchi Abdominal: Soft, NT, ND, bowel sounds + Extremities: no edema, no cyanosis  Discharge Instructions  Discharge Instructions     Discharge instructions   Complete by: As directed    Please call call MD or return to ER for similar or worsening recurring problem that brought you to hospital or if any fever,nausea/vomiting,abdominal pain, uncontrolled pain, chest pain,  shortness of breath or any other alarming symptoms.  Please follow-up your doctor as instructed in a week time and call the office for appointment.  Please avoid alcohol, smoking, or any other illicit substance and maintain healthy habits including taking your regular medications as prescribed.  You were cared for by a hospitalist during your hospital stay. If you have any questions about your discharge medications or the care you received while you were in the hospital after you are discharged, you can call the unit and ask to speak with the hospitalist on call if the hospitalist that took care of you is not available.  Once you are discharged, your primary care physician will handle any further medical issues. Please note that NO REFILLS for any discharge medications will be authorized once you are discharged, as it is imperative that you return to your primary care physician (or establish a relationship with a primary care physician if you do not have one) for your aftercare needs so that they can reassess your need for medications and monitor your lab values   Increase activity slowly   Complete by: As directed       Allergies as of 09/13/2023       Reactions   Lisinopril Cough   Norvasc [amlodipine Besylate] Other (See Comments)   Feet hurts        Medication List     STOP taking these medications    calcium-vitamin D 500-200 MG-UNIT tablet Commonly known as: OSCAL WITH D    HYDROcodone-acetaminophen 5-325 MG tablet Commonly known as: NORCO/VICODIN   potassium chloride SA 20 MEQ tablet Commonly known as: KLOR-CON M       TAKE these medications    acetaminophen 325 MG tablet Commonly known as: TYLENOL Take 2 tablets (650 mg total) by mouth every 6 (six) hours as needed for mild pain (or Fever >/= 101).   albuterol 108 (90 Base) MCG/ACT inhaler Commonly known as: VENTOLIN HFA 1 puff as needed Inhalation every 4 hrs for 30 days   allopurinol 300 MG tablet Commonly known as: ZYLOPRIM TAKE 1 TABLET BY MOUTH DAILY Orally Once a day   amLODipine 10 MG tablet Commonly known as: NORVASC Take 10 mg by mouth daily. What changed: Another medication with the same name was removed. Continue taking this medication, and follow the directions you see here.   BC HEADACHE PO Take 1 packet by mouth daily as needed.   clonazePAM 1 MG tablet Commonly known as: KLONOPIN Take 1 mg by mouth 2 (two) times daily as needed for anxiety.   colchicine 0.6 MG tablet Take 0.3 mg by mouth  daily.   escitalopram 20 MG tablet Commonly known as: LEXAPRO Take 20 mg by mouth daily.   folic acid 1 MG tablet Commonly known as: FOLVITE Take 1 tablet (1 mg total) by mouth daily.   levothyroxine 25 MCG tablet Commonly known as: SYNTHROID 1 tablet in the morning on an empty stomach Orally Once a day   omeprazole 40 MG capsule Commonly known as: PRILOSEC 1 capsule 30 minutes before morning meal Orally Once a day   ondansetron 4 MG disintegrating tablet Commonly known as: ZOFRAN-ODT Take 1 tablet (4 mg total) by mouth every 8 (eight) hours as needed for nausea or vomiting.   promethazine-dextromethorphan 6.25-15 MG/5ML syrup Commonly known as: PROMETHAZINE-DM Take 5 mLs by mouth 4 (four) times daily as needed for cough.   QUEtiapine 200 MG tablet Commonly known as: SEROQUEL Take 200 mg by mouth at bedtime.   thiamine 100 MG tablet Commonly known as: VITAMIN B1 Take  1 tablet (100 mg total) by mouth daily.   traZODone 50 MG tablet Commonly known as: DESYREL Take 50 mg by mouth at bedtime.        Follow-up Information     Alexandra Lund, PA Follow up in 1 week(s).   Specialty: Family Medicine Contact information: [phone] [place] [address] [phone]                Allergies  Allergen Reactions   Lisinopril Cough   Norvasc [Amlodipine Besylate] Other (See Comments)    Feet hurts    The results of significant diagnostics from this hospitalization (including imaging, microbiology, ancillary and laboratory) are listed below for reference.    Microbiology: Recent Results (from the past 240 hour(s))  Respiratory (~20 pathogens) panel by PCR     Status: None   Collection Time: 09/12/23  8:58 AM   Specimen: Anterior Nasal Swab; Respiratory  Result Value Ref Range Status   Adenovirus NOT DETECTED NOT DETECTED Final   Coronavirus 229E NOT DETECTED NOT DETECTED Final    Comment: (NOTE) The Coronavirus on the Respiratory Panel, DOES NOT test for the novel  Coronavirus (2019 nCoV)    Coronavirus HKU1 NOT DETECTED NOT DETECTED Final   Coronavirus NL63 NOT DETECTED NOT DETECTED Final   Coronavirus OC43 NOT DETECTED NOT DETECTED Final   Metapneumovirus NOT DETECTED NOT DETECTED Final   Rhinovirus / Enterovirus NOT DETECTED NOT DETECTED Final   Influenza A NOT DETECTED NOT DETECTED Final   Influenza B NOT DETECTED NOT DETECTED Final   Parainfluenza Virus 1 NOT DETECTED NOT DETECTED Final   Parainfluenza Virus 2 NOT DETECTED NOT DETECTED Final   Parainfluenza Virus 3 NOT DETECTED NOT DETECTED Final   Parainfluenza Virus 4 NOT DETECTED NOT DETECTED Final   Respiratory Syncytial Virus NOT DETECTED NOT DETECTED Final   Bordetella pertussis NOT DETECTED NOT DETECTED Final   Bordetella Parapertussis NOT DETECTED NOT DETECTED Final   Chlamydophila pneumoniae NOT DETECTED NOT DETECTED Final   Mycoplasma pneumoniae  NOT DETECTED NOT DETECTED Final    Comment: Performed at [facility], [address], [place], [place] [address]  Gastrointestinal Panel by PCR , Stool     Status: None   Collection Time: 09/12/23  9:22 AM   Specimen: Stool  Result Value Ref Range Status   Campylobacter species NOT DETECTED NOT DETECTED Final   Plesimonas shigelloides NOT DETECTED NOT DETECTED Final   Salmonella species NOT DETECTED NOT DETECTED Final   Yersinia enterocolitica NOT DETECTED  NOT DETECTED Final   Vibrio species NOT DETECTED NOT DETECTED Final   Vibrio cholerae NOT DETECTED NOT DETECTED Final   Enteroaggregative E coli (EAEC) NOT DETECTED NOT DETECTED Final   Enteropathogenic E coli (EPEC) NOT DETECTED NOT DETECTED Final   Enterotoxigenic E coli (ETEC) NOT DETECTED NOT DETECTED Final   Shiga like toxin producing E coli (STEC) NOT DETECTED NOT DETECTED Final   Shigella/Enteroinvasive E coli (EIEC) NOT DETECTED NOT DETECTED Final   Cryptosporidium NOT DETECTED NOT DETECTED Final   Cyclospora cayetanensis NOT DETECTED NOT DETECTED Final   Entamoeba histolytica NOT DETECTED NOT DETECTED Final   Giardia lamblia NOT DETECTED NOT DETECTED Final   Adenovirus F40/41 NOT DETECTED NOT DETECTED Final   Astrovirus NOT DETECTED NOT DETECTED Final   Norovirus GI/GII NOT DETECTED NOT DETECTED Final   Rotavirus A NOT DETECTED NOT DETECTED Final   Sapovirus (I, II, IV, and V) NOT DETECTED NOT DETECTED Final    Comment: Performed at [facility], [address] [place], [place] [address]  C Difficile Quick Screen w PCR reflex     Status: Abnormal   Collection Time: 09/12/23  9:22 AM   Specimen: Stool  Result Value Ref Range Status   C Diff antigen POSITIVE (A) NEGATIVE Final   C Diff toxin NEGATIVE NEGATIVE Final   C Diff interpretation Results are indeterminate. See PCR results.  Final    Comment: Performed at [facility], [address] [place], [place] [address]  C. Diff by PCR,  Reflexed     Status: None   Collection Time: 09/12/23  9:22 AM  Result Value Ref Range Status   Toxigenic C. Difficile by PCR NEGATIVE NEGATIVE Final    Comment: Patient is colonized with non toxigenic C. difficile. May not need treatment unless significant symptoms are present. Performed at [facility], [address] [place], [place] [address]     Procedures/Studies: DG Chest Port 1 View  Result Date: 09/11/2023 CLINICAL DATA:  Hypoxia EXAM: PORTABLE CHEST 1 VIEW COMPARISON:  04/25/2021 FINDINGS: The heart size and mediastinal contours are within normal limits. Both lungs are clear. The visualized skeletal structures are unremarkable. IMPRESSION: No active disease. Electronically Signed   By: Jasmine Pang M.D.   On: 09/11/2023 23:52    Labs: BNP (last 3 results) No results for input(s): "BNP" in the last 8760 hours. Basic Metabolic Panel: Recent Labs  Lab 09/11/23 2138 09/12/23 0543 09/12/23 1830 09/13/23 0050  NA 132* 137 135 135  K <2.0* 2.9* 3.0* 3.4*  CL 90* 95* 90* 91*  CO2 24 31 35* 34*  GLUCOSE 130* 84 105* 102*  BUN 5* <5* <5* 5*  CREATININE 0.46 0.36* 0.64 0.32*  CALCIUM 7.2* 7.8* 7.9* 8.1*  MG 1.1* 1.5* 1.8 1.8   Liver Function Tests: Recent Labs  Lab 09/11/23 2138 09/12/23 0543 09/13/23 1000  AST 14* 14* 17  ALT 5 <5 6  ALKPHOS 72 67 78  BILITOT 0.6 0.8 0.3  PROT 7.0 6.1* 6.2*  ALBUMIN 3.5 3.0* 3.1*   No results for input(s): "LIPASE", "AMYLASE" in the last 168 hours. No results for input(s): "AMMONIA" in the last 168 hours. CBC: Recent Labs  Lab 09/11/23 2138 09/12/23 0922 09/13/23 0050  WBC 13.1* 9.4 7.0  NEUTROABS 10.3* 7.7 5.2  HGB 10.2* 8.9* 9.4*  HCT 29.1* 27.8* 28.5*  MCV 101.7* 106.5* 105.9*  PLT 188 181 188   Cardiac Enzymes: No results for input(s): "CKTOTAL", "CKMB", "CKMBINDEX", "  TROPONINI" in the last 168 hours. BNP: Invalid input(s): "POCBNP" CBG: Recent Labs  Lab 09/11/23 2209  GLUCAP 123*   D-Dimer No  results for input(s): "DDIMER" in the last 72 hours. Hgb A1c No results for input(s): "HGBA1C" in the last 72 hours. Lipid Profile No results for input(s): "CHOL", "HDL", "LDLCALC", "TRIG", "CHOLHDL", "LDLDIRECT" in the last 72 hours. Thyroid function studies No results for input(s): "TSH", "T4TOTAL", "T3FREE", "THYROIDAB" in the last 72 hours.  Invalid input(s): "FREET3" Anemia work up Recent Labs    09/12/23 0543  VITAMINB12 191  FOLATE 33.9   Urinalysis    Component Value Date/Time   COLORURINE COLORLESS (A) 09/12/2023 0543   APPEARANCEUR CLEAR 09/12/2023 0543   LABSPEC 1.002 (L) 09/12/2023 0543   PHURINE 6.0 09/12/2023 0543   GLUCOSEU NEGATIVE 09/12/2023 0543   HGBUR SMALL (A) 09/12/2023 0543   BILIRUBINUR NEGATIVE 09/12/2023 0543   BILIRUBINUR negative 09/01/2017 1514   BILIRUBINUR neg 02/05/2015 1111   KETONESUR NEGATIVE 09/12/2023 0543   PROTEINUR NEGATIVE 09/12/2023 0543   UROBILINOGEN negative (A) 09/01/2017 1514   UROBILINOGEN 0.2 01/02/2014 0027   NITRITE NEGATIVE 09/12/2023 0543   LEUKOCYTESUR NEGATIVE 09/12/2023 0543   Sepsis Labs Recent Labs  Lab 09/11/23 2138 09/12/23 0922 09/13/23 0050  WBC 13.1* 9.4 7.0   Microbiology Recent Results (from the past 240 hour(s))  Respiratory (~20 pathogens) panel by PCR     Status: None   Collection Time: 09/12/23  8:58 AM   Specimen: Anterior Nasal Swab; Respiratory  Result Value Ref Range Status   Adenovirus NOT DETECTED NOT DETECTED Final   Coronavirus 229E NOT DETECTED NOT DETECTED Final    Comment: (NOTE) The Coronavirus on the Respiratory Panel, DOES NOT test for the novel  Coronavirus (2019 nCoV)    Coronavirus HKU1 NOT DETECTED NOT DETECTED Final   Coronavirus NL63 NOT DETECTED NOT DETECTED Final   Coronavirus OC43 NOT DETECTED NOT DETECTED Final   Metapneumovirus NOT DETECTED NOT DETECTED Final   Rhinovirus / Enterovirus NOT DETECTED NOT DETECTED Final   Influenza A NOT DETECTED NOT DETECTED Final    Influenza B NOT DETECTED NOT DETECTED Final   Parainfluenza Virus 1 NOT DETECTED NOT DETECTED Final   Parainfluenza Virus 2 NOT DETECTED NOT DETECTED Final   Parainfluenza Virus 3 NOT DETECTED NOT DETECTED Final   Parainfluenza Virus 4 NOT DETECTED NOT DETECTED Final   Respiratory Syncytial Virus NOT DETECTED NOT DETECTED Final   Bordetella pertussis NOT DETECTED NOT DETECTED Final   Bordetella Parapertussis NOT DETECTED NOT DETECTED Final   Chlamydophila pneumoniae NOT DETECTED NOT DETECTED Final   Mycoplasma pneumoniae NOT DETECTED NOT DETECTED Final    Comment: Performed at [facility], [address] [place], [place] [address]  Gastrointestinal Panel by PCR , Stool     Status: None   Collection Time: 09/12/23  9:22 AM   Specimen: Stool  Result Value Ref Range Status   Campylobacter species NOT DETECTED NOT DETECTED Final   Plesimonas shigelloides NOT DETECTED NOT DETECTED Final   Salmonella species NOT DETECTED NOT DETECTED Final   Yersinia enterocolitica NOT DETECTED NOT DETECTED Final   Vibrio species NOT DETECTED NOT DETECTED Final   Vibrio cholerae NOT DETECTED NOT DETECTED Final   Enteroaggregative E coli (EAEC) NOT DETECTED NOT DETECTED Final   Enteropathogenic E coli (EPEC) NOT DETECTED NOT DETECTED Final   Enterotoxigenic E coli (ETEC) NOT DETECTED NOT DETECTED Final   Shiga like toxin producing E coli (STEC) NOT DETECTED  NOT DETECTED Final   Shigella/Enteroinvasive E coli (EIEC) NOT DETECTED NOT DETECTED Final   Cryptosporidium NOT DETECTED NOT DETECTED Final   Cyclospora cayetanensis NOT DETECTED NOT DETECTED Final   Entamoeba histolytica NOT DETECTED NOT DETECTED Final   Giardia lamblia NOT DETECTED NOT DETECTED Final   Adenovirus F40/41 NOT DETECTED NOT DETECTED Final   Astrovirus NOT DETECTED NOT DETECTED Final   Norovirus GI/GII NOT DETECTED NOT DETECTED Final   Rotavirus A NOT DETECTED NOT DETECTED Final   Sapovirus (I, II, IV, and V) NOT DETECTED NOT  DETECTED Final    Comment: Performed at [facility] [address] [place], [place] [address]  C Difficile Quick Screen w PCR reflex     Status: Abnormal   Collection Time: 09/12/23  9:22 AM   Specimen: Stool  Result Value Ref Range Status   C Diff antigen POSITIVE (A) NEGATIVE Final   C Diff toxin NEGATIVE NEGATIVE Final   C Diff interpretation Results are indeterminate. See PCR results.  Final    Comment: Performed at [facility], [address], [place], [place] [address]  C. Diff by PCR, Reflexed     Status: None   Collection Time: 09/12/23  9:22 AM  Result Value Ref Range Status   Toxigenic C. Difficile by PCR NEGATIVE NEGATIVE Final    Comment: Patient is colonized with non toxigenic C. difficile. May not need treatment unless significant symptoms are present. Performed at [facility], [address] [place], [place] [address]    Time coordinating discharge: 25 minutes  SIGNED: Lanae Boast, MD  [facility] 09/13/2023, 1:25 PM  If 7PM-7AM, please contact night-coverage [email or web address]

## 2023-10-14 DIAGNOSIS — S4992XA Unspecified injury of left shoulder and upper arm, initial encounter: Secondary | ICD-10-CM | POA: Diagnosis not present

## 2023-10-14 DIAGNOSIS — R053 Chronic cough: Secondary | ICD-10-CM | POA: Diagnosis not present

## 2023-10-14 DIAGNOSIS — R062 Wheezing: Secondary | ICD-10-CM | POA: Diagnosis not present

## 2023-10-14 DIAGNOSIS — J209 Acute bronchitis, unspecified: Secondary | ICD-10-CM | POA: Diagnosis not present

## 2023-11-14 ENCOUNTER — Encounter (HOSPITAL_COMMUNITY): Payer: Self-pay | Admitting: Emergency Medicine

## 2023-11-14 ENCOUNTER — Inpatient Hospital Stay (HOSPITAL_COMMUNITY)
Admission: EM | Admit: 2023-11-14 | Discharge: 2023-11-17 | DRG: 683 | Disposition: A | Discharge status: Home / Self Care | Payer: Self-pay | Attending: Family Medicine | Admitting: Family Medicine

## 2023-11-14 DIAGNOSIS — I10 Essential (primary) hypertension: Secondary | ICD-10-CM | POA: Diagnosis present

## 2023-11-14 DIAGNOSIS — K219 Gastro-esophageal reflux disease without esophagitis: Secondary | ICD-10-CM | POA: Diagnosis present

## 2023-11-14 DIAGNOSIS — E876 Hypokalemia: Principal | ICD-10-CM | POA: Diagnosis present

## 2023-11-14 DIAGNOSIS — F411 Generalized anxiety disorder: Secondary | ICD-10-CM | POA: Diagnosis present

## 2023-11-14 DIAGNOSIS — F419 Anxiety disorder, unspecified: Secondary | ICD-10-CM | POA: Diagnosis present

## 2023-11-14 DIAGNOSIS — E039 Hypothyroidism, unspecified: Secondary | ICD-10-CM | POA: Diagnosis present

## 2023-11-14 DIAGNOSIS — E860 Dehydration: Secondary | ICD-10-CM | POA: Diagnosis present

## 2023-11-14 DIAGNOSIS — Z7982 Long term (current) use of aspirin: Secondary | ICD-10-CM

## 2023-11-14 DIAGNOSIS — Z8249 Family history of ischemic heart disease and other diseases of the circulatory system: Secondary | ICD-10-CM

## 2023-11-14 DIAGNOSIS — Z87891 Personal history of nicotine dependence: Secondary | ICD-10-CM

## 2023-11-14 DIAGNOSIS — Z823 Family history of stroke: Secondary | ICD-10-CM

## 2023-11-14 DIAGNOSIS — N179 Acute kidney failure, unspecified: Principal | ICD-10-CM | POA: Diagnosis present

## 2023-11-14 DIAGNOSIS — G4700 Insomnia, unspecified: Secondary | ICD-10-CM | POA: Diagnosis present

## 2023-11-14 DIAGNOSIS — Z79890 Hormone replacement therapy (postmenopausal): Secondary | ICD-10-CM

## 2023-11-14 DIAGNOSIS — R9431 Abnormal electrocardiogram [ECG] [EKG]: Secondary | ICD-10-CM | POA: Diagnosis present

## 2023-11-14 DIAGNOSIS — F10239 Alcohol dependence with withdrawal, unspecified: Secondary | ICD-10-CM | POA: Diagnosis present

## 2023-11-14 DIAGNOSIS — Z79899 Other long term (current) drug therapy: Secondary | ICD-10-CM

## 2023-11-14 HISTORY — DX: Gout, unspecified: M10.9

## 2023-11-14 HISTORY — DX: Alcohol abuse, uncomplicated: F10.10

## 2023-11-14 LAB — I-STAT CHEM 8, ED
BUN: 8 mg/dL (ref 6–20)
BUN: 9 mg/dL (ref 6–20)
Calcium, Ion: 0.71 mmol/L — CL (ref 1.15–1.40)
Calcium, Ion: 0.74 mmol/L — CL (ref 1.15–1.40)
Chloride: 77 mmol/L — ABNORMAL LOW (ref 98–111)
Chloride: 87 mmol/L — ABNORMAL LOW (ref 98–111)
Creatinine, Ser: 2.5 mg/dL — ABNORMAL HIGH (ref 0.44–1.00)
Creatinine, Ser: 2.2 mg/dL — ABNORMAL HIGH (ref 0.44–1.00)
Glucose, Bld: 153 mg/dL — ABNORMAL HIGH (ref 70–99)
Glucose, Bld: 122 mg/dL — ABNORMAL HIGH (ref 70–99)
HCT: 54 % — ABNORMAL HIGH (ref 36.0–46.0)
HCT: 45 % (ref 36.0–46.0)
Hemoglobin: 18.4 g/dL — ABNORMAL HIGH (ref 12.0–15.0)
Hemoglobin: 15.3 g/dL — ABNORMAL HIGH (ref 12.0–15.0)
Potassium: 3.1 mmol/L — ABNORMAL LOW (ref 3.5–5.1)
Potassium: 4.9 mmol/L (ref 3.5–5.1)
Sodium: 128 mmol/L — ABNORMAL LOW (ref 135–145)
Sodium: 130 mmol/L — ABNORMAL LOW (ref 135–145)
TCO2: 38 mmol/L — ABNORMAL HIGH (ref 22–32)
TCO2: 38 mmol/L — ABNORMAL HIGH (ref 22–32)

## 2023-11-14 LAB — ETHANOL: Alcohol, Ethyl (B): <10 mg/dL (ref <10)

## 2023-11-14 LAB — COMPREHENSIVE METABOLIC PANEL
ALT: 12 U/L (ref 0–44)
AST: 61 U/L — ABNORMAL HIGH (ref 15–41)
Albumin: 4.8 g/dL (ref 3.5–5.0)
Alkaline Phosphatase: 85 U/L (ref 38–126)
Anion gap: 28 — ABNORMAL HIGH (ref 5–15)
BUN: 9 mg/dL (ref 6–20)
CO2: 31 mmol/L (ref 22–32)
Calcium: 7.9 mg/dL — ABNORMAL LOW (ref 8.9–10.3)
Chloride: 73 mmol/L — ABNORMAL LOW (ref 98–111)
Creatinine, Ser: 2.53 mg/dL — ABNORMAL HIGH (ref 0.44–1.00)
GFR, Estimated: 23 mL/min — ABNORMAL LOW (ref >60)
Glucose, Bld: 147 mg/dL — ABNORMAL HIGH (ref 70–99)
Potassium: 3.1 mmol/L — ABNORMAL LOW (ref 3.5–5.1)
Sodium: 132 mmol/L — ABNORMAL LOW (ref 135–145)
Total Bilirubin: 1 mg/dL (ref 0.0–1.2)
Total Protein: 8.9 g/dL — ABNORMAL HIGH (ref 6.5–8.1)

## 2023-11-14 LAB — I-STAT CG4 LACTIC ACID, ED
Lactic Acid, Venous: 9.4 mmol/L (ref 0.5–1.9)
Lactic Acid, Venous: 2.9 mmol/L (ref 0.5–1.9)

## 2023-11-14 LAB — CBC
HCT: 46.7 % — ABNORMAL HIGH (ref 36.0–46.0)
Hemoglobin: 16.4 g/dL — ABNORMAL HIGH (ref 12.0–15.0)
MCH: 35 pg — ABNORMAL HIGH (ref 26.0–34.0)
MCHC: 35.1 g/dL (ref 30.0–36.0)
MCV: 99.6 fL (ref 80.0–100.0)
Platelets: 283 10*3/uL (ref 150–400)
RBC: 4.69 MIL/uL (ref 3.87–5.11)
RDW: 13.4 % (ref 11.5–15.5)
WBC: 15.2 10*3/uL — ABNORMAL HIGH (ref 4.0–10.5)
nRBC: 0 % (ref 0.0–0.2)

## 2023-11-14 LAB — MAGNESIUM
Magnesium: 0.8 mg/dL — CL (ref 1.7–2.4)
Magnesium: 1.8 mg/dL (ref 1.7–2.4)

## 2023-11-14 LAB — LIPASE, BLOOD: Lipase: 37 U/L (ref 11–51)

## 2023-11-14 MED ORDER — THIAMINE HCL 100 MG/ML IJ SOLN
100.0000 mg | Freq: Once | INTRAMUSCULAR | Status: AC
  Administered 2023-11-14: 100 mg via INTRAVENOUS
  Filled 2023-11-14: qty 2

## 2023-11-14 MED ORDER — SODIUM CHLORIDE 0.9 % IV BOLUS
1000.0000 mL | Freq: Once | INTRAVENOUS | Status: AC
  Administered 2023-11-14: 1000 mL via INTRAVENOUS

## 2023-11-14 MED ORDER — LORAZEPAM 2 MG/ML IJ SOLN
2.0000 mg | Freq: Once | INTRAMUSCULAR | Status: AC
  Administered 2023-11-14: 2 mg via INTRAVENOUS
  Filled 2023-11-14: qty 1

## 2023-11-14 MED ORDER — ALUM & MAG HYDROXIDE-SIMETH 200-200-20 MG/5ML PO SUSP
30.0000 mL | Freq: Once | ORAL | Status: AC
  Administered 2023-11-14: 30 mL via ORAL
  Filled 2023-11-14: qty 30

## 2023-11-14 MED ORDER — MAGNESIUM SULFATE 2 GM/50ML IV SOLN
2.0000 g | Freq: Once | INTRAVENOUS | Status: AC
Start: 2023-11-14 — End: 2023-11-14
  Administered 2023-11-14: 2 g via INTRAVENOUS
  Filled 2023-11-14: qty 50

## 2023-11-14 MED ORDER — FOLIC ACID 1 MG PO TABS
1.0000 mg | ORAL_TABLET | Freq: Once | ORAL | Status: AC
  Administered 2023-11-14: 1 mg via ORAL
  Filled 2023-11-14: qty 1

## 2023-11-14 MED ORDER — POTASSIUM CHLORIDE 20 MEQ PO PACK
40.0000 meq | PACK | Freq: Once | ORAL | Status: AC
  Administered 2023-11-14: 40 meq via ORAL
  Filled 2023-11-14: qty 2

## 2023-11-14 MED ORDER — CALCIUM GLUCONATE-NACL 1-0.675 GM/50ML-% IV SOLN
1.0000 g | Freq: Once | INTRAVENOUS | Status: AC
  Administered 2023-11-14: 1000 mg via INTRAVENOUS
  Filled 2023-11-14: qty 50

## 2023-11-14 MED ORDER — MORPHINE SULFATE (PF) 4 MG/ML IV SOLN
4.0000 mg | Freq: Once | INTRAVENOUS | Status: AC
  Administered 2023-11-14: 4 mg via INTRAVENOUS
  Filled 2023-11-14: qty 1

## 2023-11-14 NOTE — ED Triage Notes (Signed)
Pt here from home with c/o n/v for 2 days along with cramps in her arms ,hands and legs ,

## 2023-11-14 NOTE — ED Provider Notes (Signed)
[place] EMERGENCY DEPARTMENT AT [facility] Provider Note   CSN: [id number] Arrival date & time: 11/14/23  1716     History  Chief Complaint  Patient presents with   Nausea   Emesis    Alexandra Dorsey is a 48 y.o. female.  With history of alcohol use disorder and hypocalcemia who presents to ED for cramping..  Patient drinks alcohol heavily but not daily.  Last drink was 1 pint 2 days ago.  She can typically go 3 consecutive days without drinking and without severe withdrawal.  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  Beginning last night around 2130 bilateral hands began to become painful with cramping also cramping in her feet and in the jaw.  She is experienced this before in the setting of hypocalcemia.  She vomited multiple times yesterday and today.  No hematemesis.  No current nausea.  Denies fevers chills chest pain shortness of breath.  No other drug use.  No seizure activity today.  Emesis      Home Medications Prior to Admission medications   Medication Sig Start Date End Date Taking? Authorizing Provider  acetaminophen (TYLENOL) 325 MG tablet Take 2 tablets (650 mg total) by mouth every 6 (six) hours as needed for mild pain (or Fever >/= 101). 04/27/21   Rodolph Bong, MD  albuterol (VENTOLIN HFA) 108 (90 Base) MCG/ACT inhaler 1 puff as needed Inhalation every 4 hrs for 30 days 06/28/23   [provider]  allopurinol (ZYLOPRIM) 300 MG tablet TAKE 1 TABLET BY MOUTH DAILY Orally Once a day    [provider]  amLODipine (NORVASC) 10 MG tablet Take 10 mg by mouth daily.    [provider]  Aspirin-Salicylamide-Caffeine (BC HEADACHE PO) Take 1 packet by mouth daily as needed.    [provider]  clonazePAM (KLONOPIN) 1 MG tablet Take 1 mg by mouth 2 (two) times daily as needed for anxiety.    [provider]  colchicine 0.6 MG tablet Take 0.3 mg by mouth daily.    [provider]   escitalopram (LEXAPRO) 20 MG tablet Take 20 mg by mouth daily. 08/13/23   [provider]  folic acid (FOLVITE) 1 MG tablet Take 1 tablet (1 mg total) by mouth daily. 04/28/21   Rodolph Bong, MD  levothyroxine (SYNTHROID) 25 MCG tablet 1 tablet in the morning on an empty stomach Orally Once a day 01/04/23   [provider]  omeprazole (PRILOSEC) 40 MG capsule 1 capsule 30 minutes before morning meal Orally Once a day 03/28/23   [provider]  ondansetron (ZOFRAN-ODT) 4 MG disintegrating tablet Take 1 tablet (4 mg total) by mouth every 8 (eight) hours as needed for nausea or vomiting. Patient not taking: Reported on 03/21/2022 11/30/21   Pricilla Loveless, MD  QUEtiapine (SEROQUEL) 200 MG tablet Take 200 mg by mouth at bedtime.    [provider]  thiamine 100 MG tablet Take 1 tablet (100 mg total) by mouth daily. Patient not taking: Reported on 03/20/2022 04/28/21   Rodolph Bong, MD  traZODone (DESYREL) 50 MG tablet Take 50 mg by mouth at bedtime. 12/20/21   [provider]      Allergies    Lisinopril and Norvasc [amlodipine besylate]    Review of Systems   Review of Systems  Gastrointestinal:  Positive for vomiting.    Physical Exam Updated Vital Signs BP (!) 154/109 (BP Location: Left Arm)   Pulse Marland Kitchen)  109   Temp 98.1 F (36.7 C) (Oral)   Resp (!) 26   SpO2 97%  Physical Exam Vitals and nursing note reviewed.  HENT:     Head: Normocephalic and atraumatic.  Eyes:     Pupils: Pupils are equal, round, and reactive to light.  Cardiovascular:     Rate and Rhythm: Normal rate and regular rhythm.  Pulmonary:     Effort: Pulmonary effort is normal.     Breath sounds: Normal breath sounds.  Abdominal:     Palpations: Abdomen is soft.     Tenderness: There is no abdominal tenderness.  Musculoskeletal:     Comments: Spasm of bilateral hands with clonus in bilateral feet  Skin:    General: Skin is warm and dry.  Neurological:      Mental Status: She is alert.  Psychiatric:        Mood and Affect: Mood normal.     ED Results / Procedures / Treatments   Labs (all labs ordered are listed, but only abnormal results are displayed) Labs Reviewed  COMPREHENSIVE METABOLIC PANEL - Abnormal; Notable for the following components:      Result Value   Sodium 132 (*)    Potassium 3.1 (*)    Chloride 73 (*)    Glucose, Bld 147 (*)    Creatinine, Ser 2.53 (*)    Calcium 7.9 (*)    Total Protein 8.9 (*)    AST 61 (*)    GFR, Estimated 23 (*)    Anion gap 28 (*)    All other components within normal limits  CBC - Abnormal; Notable for the following components:   WBC 15.2 (*)    Hemoglobin 16.4 (*)    HCT 46.7 (*)    MCH 35.0 (*)    All other components within normal limits  MAGNESIUM - Abnormal; Notable for the following components:   Magnesium 0.8 (*)    All other components within normal limits  I-STAT CHEM 8, ED - Abnormal; Notable for the following components:   Sodium 128 (*)    Potassium 3.1 (*)    Chloride 77 (*)    Creatinine, Ser 2.50 (*)    Glucose, Bld 153 (*)    Calcium, Ion 0.71 (*)    TCO2 38 (*)    Hemoglobin 18.4 (*)    HCT 54.0 (*)    All other components within normal limits  I-STAT CG4 LACTIC ACID, ED - Abnormal; Notable for the following components:   Lactic Acid, Venous 9.4 (*)    All other components within normal limits  I-STAT CG4 LACTIC ACID, ED - Abnormal; Notable for the following components:   Lactic Acid, Venous 2.9 (*)    All other components within normal limits  I-STAT CHEM 8, ED - Abnormal; Notable for the following components:   Sodium 130 (*)    Chloride 87 (*)    Creatinine, Ser 2.20 (*)    Glucose, Bld 122 (*)    Calcium, Ion 0.74 (*)    TCO2 38 (*)    Hemoglobin 15.3 (*)    All other components within normal limits  LIPASE, BLOOD  ETHANOL  MAGNESIUM  URINALYSIS, ROUTINE W REFLEX MICROSCOPIC    EKG EKG Interpretation Date/Time:  Tuesday November 14 2023  17:28:15 EST Ventricular Rate:  127 PR Interval:  130 QRS Duration:  81 QT Interval:  367 QTC Calculation: 534 R Axis:   130  Text Interpretation: Sinus tachycardia Multiple premature complexes, vent &  supraven Aberrant complex Probable anterior infarct, age indeterminate Abnormal T, consider ischemia, inferior leads Prolonged QT interval Baseline wander in lead(s) I aVR V1 V2 V3 V4 Confirmed by Estelle June [phone]) on 11/14/2023 6:06:00 PM  Radiology No results found.  Procedures .Critical Care  Performed by: Royanne Foots, DO Authorized by: Royanne Foots, DO   Critical care provider statement:    Critical care time (minutes):  40   Critical care time was exclusive of:  Separately billable procedures and treating other patients and teaching time   Critical care was necessary to treat or prevent imminent or life-threatening deterioration of the following conditions:  Metabolic crisis   Critical care was time spent personally by me on the following activities:  Development of treatment plan with patient or surrogate, discussions with consultants, evaluation of patient's response to treatment, examination of patient, ordering and review of laboratory studies, ordering and review of radiographic studies, ordering and performing treatments and interventions, pulse oximetry, re-evaluation of patient's condition and review of old charts   I assumed direction of critical care for this patient from another provider in my specialty: no     Care discussed with: admitting provider       Medications Ordered in ED Medications  folic acid (FOLVITE) tablet 1 mg (has no administration in time range)  calcium gluconate 1 g/ 50 mL sodium chloride IVPB (has no administration in time range)  sodium chloride 0.9 % bolus 1,000 mL (0 mLs Intravenous Stopped 11/14/23 2214)  thiamine (VITAMIN B1) injection 100 mg (100 mg Intravenous Given 11/14/23 1859)  LORazepam (ATIVAN) injection 2 mg (2 mg  Intravenous Given 11/14/23 1859)  calcium gluconate 1 g/ 50 mL sodium chloride IVPB (0 mg Intravenous Stopped 11/14/23 2003)  magnesium sulfate IVPB 2 g 50 mL (0 g Intravenous Stopped 11/14/23 2042)  potassium chloride (KLOR-CON) packet 40 mEq (40 mEq Oral Given 11/14/23 1942)  morphine (PF) 4 MG/ML injection 4 mg (4 mg Intravenous Given 11/14/23 2134)  alum & mag hydroxide-simeth (MAALOX/MYLANTA) 200-200-20 MG/5ML suspension 30 mL (30 mLs Oral Given 11/14/23 2220)  sodium chloride 0.9 % bolus 1,000 mL (1,000 mLs Intravenous New Bag/Given 11/14/23 2249)    ED Course/ Medical Decision Making/ A&P Clinical Course as of 11/14/23 2316  Tue Nov 14, 2023  2314 [place]ed[place] spasm has improved after repletion of calcium, magnesium and potassium.  Patient reports feeling better but her ionized calcium is still critically low and she will require admission for this reason along with the AKI.  Discussed admitting hospitalist who accept patient for admission [MP]    Clinical Course User Index [MP] Royanne Foots, DO                                 Medical Decision Making 48 year old female with history as above presenting for nausea vomiting cramping in both hands and feet.  Drinks very heavily.  Last drink a pint of alcohol 2 days ago.  No withdrawal seizures but she does report a history of seizures.  Physical exam notable for bilateral hand spasm and cramping as well as some cramping in the feet and in the jaw.  Presentation and history most concerning for electrolyte imbalance such as recurrent hypokalemia, hypomagnesemia and hypocalcemia as a cause of her muscle spasms.  Also concern for acute alcohol withdrawal.  Will start on CIWA and give lorazepam IV fluids and replete potassium/calcium/magnesium here  Amount and/or Complexity  of Data Reviewed Labs: ordered.  Risk OTC drugs. Prescription drug management. Decision regarding hospitalization.           Final Clinical Impression(s) / ED  Diagnoses Final diagnoses:  Hypokalemia  Dehydration  Hypomagnesemia  Hypocalcemia  Acute kidney injury [facility])    Rx / DC Orders ED Discharge Orders     None         Royanne Foots, DO 11/14/23 2316

## 2023-11-14 NOTE — ED Notes (Signed)
Attempted IV times 2.

## 2023-11-14 NOTE — ED Provider Triage Note (Signed)
Emergency Medicine Provider Triage Evaluation Note  Alexandra Dorsey , a 48 y.o. female  was evaluated in triage.  Pt complains of hand cramping, nausea, vomiting, dehydration.  History of alcohol abuse, drinks about a pint of liquor per day.  He reports no drink since Sunday.  She reports she has never had symptoms like this previously with withdrawal.  Previous history of withdrawal seizures.  Does have a history of having hypocalcemia, hyponatremia, hypomagnesemia secondary to alcohol withdrawal.  Review of Systems  Positive: Cramping, dehydration, pain, nausea, vomiting Negative: Chest pain  Physical Exam  BP (!) 129/116   Pulse (!) 132   Temp 98.3 F (36.8 C) (Oral)   Resp 18   SpO2 98%  Gen:   Ill appearing Resp:  Normal effort  MSK:   Moves extremities without difficulty  Other:  Cramping of hands, held in flexed position, tachycardic, dry membranes, ill appearing  Medical Decision Making  Medically screening exam initiated at 5:56 PM.  Appropriate orders placed.  Alexandra Dorsey was informed that the remainder of the evaluation will be completed by another provider, this initial triage assessment does not replace that evaluation, and the importance of remaining in the ED until their evaluation is complete.  Patient will need next available bed, nursing informed   [place] 11/14/23 1758

## 2023-11-14 NOTE — ED Notes (Signed)
RN and Provider notified about I-stat

## 2023-11-14 NOTE — ED Notes (Signed)
RN and Provider notified about iCa

## 2023-11-15 ENCOUNTER — Encounter (HOSPITAL_COMMUNITY): Payer: Self-pay | Admitting: Family Medicine

## 2023-11-15 LAB — MAGNESIUM: Magnesium: 1.6 mg/dL — ABNORMAL LOW (ref 1.7–2.4)

## 2023-11-15 LAB — URINALYSIS, ROUTINE W REFLEX MICROSCOPIC
Bacteria, UA: NONE SEEN
Bilirubin Urine: NEGATIVE
Glucose, UA: NEGATIVE mg/dL
Hgb urine dipstick: NEGATIVE
Ketones, ur: NEGATIVE mg/dL
Leukocytes,Ua: NEGATIVE
Nitrite: NEGATIVE
Protein, ur: 100 mg/dL — AB
Specific Gravity, Urine: 1.018 (ref 1.005–1.030)
pH: 5 (ref 5.0–8.0)

## 2023-11-15 LAB — BASIC METABOLIC PANEL
Anion gap: 17 — ABNORMAL HIGH (ref 5–15)
BUN: 8 mg/dL (ref 6–20)
CO2: 30 mmol/L (ref 22–32)
Calcium: 7.1 mg/dL — ABNORMAL LOW (ref 8.9–10.3)
Chloride: 90 mmol/L — ABNORMAL LOW (ref 98–111)
Creatinine, Ser: 1.14 mg/dL — ABNORMAL HIGH (ref 0.44–1.00)
GFR, Estimated: >60 mL/min (ref >60)
Glucose, Bld: 84 mg/dL (ref 70–99)
Potassium: 2.7 mmol/L — CL (ref 3.5–5.1)
Sodium: 137 mmol/L (ref 135–145)

## 2023-11-15 LAB — CBC
HCT: 37.1 % (ref 36.0–46.0)
Hemoglobin: 12.7 g/dL (ref 12.0–15.0)
MCH: 34.6 pg — ABNORMAL HIGH (ref 26.0–34.0)
MCHC: 34.2 g/dL (ref 30.0–36.0)
MCV: 101.1 fL — ABNORMAL HIGH (ref 80.0–100.0)
Platelets: 155 10*3/uL (ref 150–400)
RBC: 3.67 MIL/uL — ABNORMAL LOW (ref 3.87–5.11)
RDW: 13.4 % (ref 11.5–15.5)
WBC: 7.6 10*3/uL (ref 4.0–10.5)
nRBC: 0 % (ref 0.0–0.2)

## 2023-11-15 LAB — LACTIC ACID, PLASMA: Lactic Acid, Venous: 1.4 mmol/L (ref 0.5–1.9)

## 2023-11-15 LAB — HEPATIC FUNCTION PANEL
ALT: 9 U/L (ref 0–44)
AST: 32 U/L (ref 15–41)
Albumin: 3.6 g/dL (ref 3.5–5.0)
Alkaline Phosphatase: 56 U/L (ref 38–126)
Bilirubin, Direct: 0.2 mg/dL (ref 0.0–0.2)
Indirect Bilirubin: 0.3 mg/dL (ref 0.3–0.9)
Total Bilirubin: 0.5 mg/dL (ref 0.0–1.2)
Total Protein: 6.3 g/dL — ABNORMAL LOW (ref 6.5–8.1)

## 2023-11-15 LAB — PHOSPHORUS: Phosphorus: 1.6 mg/dL — ABNORMAL LOW (ref 2.5–4.6)

## 2023-11-15 MED ORDER — AMLODIPINE BESYLATE 10 MG PO TABS
10.0000 mg | ORAL_TABLET | Freq: Every day | ORAL | Status: DC
  Administered 2023-11-15 – 2023-11-17 (×2): 10 mg via ORAL
  Filled 2023-11-15 (×2): qty 1
  Filled 2023-11-15: qty 2

## 2023-11-15 MED ORDER — POTASSIUM PHOSPHATES 15 MMOLE/5ML IV SOLN
30.0000 mmol | Freq: Once | INTRAVENOUS | Status: AC
  Administered 2023-11-15: 30 mmol via INTRAVENOUS
  Filled 2023-11-15: qty 10

## 2023-11-15 MED ORDER — POTASSIUM CHLORIDE 20 MEQ PO PACK
20.0000 meq | PACK | Freq: Once | ORAL | Status: AC
  Administered 2023-11-15: 20 meq via ORAL
  Filled 2023-11-15: qty 1

## 2023-11-15 MED ORDER — POTASSIUM PHOSPHATES 15 MMOLE/5ML IV SOLN: 30.0000 mmol | Freq: Once | INTRAVENOUS | Status: DC

## 2023-11-15 MED ORDER — LORAZEPAM 2 MG/ML IJ SOLN: 1.0000 mg | INTRAMUSCULAR | Status: DC | PRN

## 2023-11-15 MED ORDER — ESCITALOPRAM OXALATE 20 MG PO TABS
20.0000 mg | ORAL_TABLET | Freq: Every day | ORAL | Status: DC
  Administered 2023-11-15 – 2023-11-17 (×3): 20 mg via ORAL
  Filled 2023-11-15 (×2): qty 1
  Filled 2023-11-15: qty 2

## 2023-11-15 MED ORDER — LORAZEPAM 1 MG PO TABS
1.0000 mg | ORAL_TABLET | ORAL | Status: DC | PRN
  Administered 2023-11-15: 1 mg via ORAL
  Filled 2023-11-15: qty 2
  Filled 2023-11-15: qty 1
  Filled 2023-11-15: qty 2

## 2023-11-15 MED ORDER — POTASSIUM CHLORIDE CRYS ER 20 MEQ PO TBCR
40.0000 meq | EXTENDED_RELEASE_TABLET | Freq: Every day | ORAL | Status: DC
  Administered 2023-11-15: 40 meq via ORAL
  Filled 2023-11-15: qty 2

## 2023-11-15 MED ORDER — TRAZODONE HCL 50 MG PO TABS
50.0000 mg | ORAL_TABLET | Freq: Every day | ORAL | Status: DC
  Administered 2023-11-15 – 2023-11-16 (×2): 50 mg via ORAL
  Filled 2023-11-15 (×2): qty 1

## 2023-11-15 MED ORDER — THIAMINE MONONITRATE 100 MG PO TABS
100.0000 mg | ORAL_TABLET | Freq: Every day | ORAL | Status: DC
  Administered 2023-11-15 – 2023-11-17 (×3): 100 mg via ORAL
  Filled 2023-11-15 (×3): qty 1

## 2023-11-15 MED ORDER — LORAZEPAM 2 MG/ML IJ SOLN
0.0000 mg | Freq: Three times a day (TID) | INTRAMUSCULAR | Status: DC
  Administered 2023-11-17: 2 mg via INTRAVENOUS
  Filled 2023-11-15 (×2): qty 1

## 2023-11-15 MED ORDER — ACETAMINOPHEN 650 MG RE SUPP: 650.0000 mg | Freq: Four times a day (QID) | RECTAL | Status: DC | PRN

## 2023-11-15 MED ORDER — ALBUTEROL SULFATE HFA 108 (90 BASE) MCG/ACT IN AERS: 2.0000 | INHALATION_SPRAY | Freq: Four times a day (QID) | RESPIRATORY_TRACT | Status: DC | PRN

## 2023-11-15 MED ORDER — ADULT MULTIVITAMIN W/MINERALS CH
1.0000 | ORAL_TABLET | Freq: Every day | ORAL | Status: DC
  Administered 2023-11-15 – 2023-11-17 (×3): 1 via ORAL
  Filled 2023-11-15 (×3): qty 1

## 2023-11-15 MED ORDER — SODIUM CHLORIDE 0.9% FLUSH
3.0000 mL | Freq: Two times a day (BID) | INTRAVENOUS | Status: DC
  Administered 2023-11-15 – 2023-11-17 (×5): 3 mL via INTRAVENOUS

## 2023-11-15 MED ORDER — FOLIC ACID 1 MG PO TABS
1.0000 mg | ORAL_TABLET | Freq: Every day | ORAL | Status: DC
Start: 2023-11-15 — End: 2023-11-17
  Administered 2023-11-15 – 2023-11-17 (×3): 1 mg via ORAL
  Filled 2023-11-15 (×3): qty 1

## 2023-11-15 MED ORDER — LEVOTHYROXINE SODIUM 25 MCG PO TABS
25.0000 ug | ORAL_TABLET | Freq: Every day | ORAL | Status: DC
  Administered 2023-11-15 – 2023-11-17 (×3): 25 ug via ORAL
  Filled 2023-11-15 (×3): qty 1

## 2023-11-15 MED ORDER — ORAL CARE MOUTH RINSE: 15.0000 mL | OROMUCOSAL | Status: DC | PRN

## 2023-11-15 MED ORDER — LACTATED RINGERS IV SOLN: INTRAVENOUS | Status: AC

## 2023-11-15 MED ORDER — PANTOPRAZOLE SODIUM 40 MG PO TBEC
40.0000 mg | DELAYED_RELEASE_TABLET | Freq: Every day | ORAL | Status: DC
  Administered 2023-11-15 – 2023-11-17 (×3): 40 mg via ORAL
  Filled 2023-11-15 (×3): qty 1

## 2023-11-15 MED ORDER — LORAZEPAM 2 MG/ML IJ SOLN
0.0000 mg | INTRAMUSCULAR | Status: AC
  Administered 2023-11-15 (×3): 2 mg via INTRAVENOUS
  Administered 2023-11-15 – 2023-11-16 (×5): 1 mg via INTRAVENOUS
  Administered 2023-11-16 (×2): 2 mg via INTRAVENOUS
  Filled 2023-11-15 (×10): qty 1

## 2023-11-15 MED ORDER — ALBUTEROL SULFATE (2.5 MG/3ML) 0.083% IN NEBU: 2.5000 mg | INHALATION_SOLUTION | Freq: Four times a day (QID) | RESPIRATORY_TRACT | Status: DC | PRN

## 2023-11-15 MED ORDER — THIAMINE HCL 100 MG/ML IJ SOLN: 100.0000 mg | Freq: Every day | INTRAMUSCULAR | Status: DC

## 2023-11-15 MED ORDER — HEPARIN SODIUM (PORCINE) 5000 UNIT/ML IJ SOLN
5000.0000 [IU] | Freq: Three times a day (TID) | INTRAMUSCULAR | Status: DC
  Administered 2023-11-15 – 2023-11-17 (×7): 5000 [IU] via SUBCUTANEOUS
  Filled 2023-11-15 (×7): qty 1

## 2023-11-15 MED ORDER — ALLOPURINOL 300 MG PO TABS
300.0000 mg | ORAL_TABLET | Freq: Every day | ORAL | Status: DC
  Administered 2023-11-15 – 2023-11-17 (×3): 300 mg via ORAL
  Filled 2023-11-15 (×3): qty 1

## 2023-11-15 MED ORDER — QUETIAPINE FUMARATE 200 MG PO TABS
200.0000 mg | ORAL_TABLET | Freq: Every day | ORAL | Status: DC
Start: 2023-11-15 — End: 2023-11-17
  Administered 2023-11-15 – 2023-11-16 (×2): 200 mg via ORAL
  Filled 2023-11-15 (×2): qty 1

## 2023-11-15 MED ORDER — MAGNESIUM SULFATE IN D5W 1-5 GM/100ML-% IV SOLN
1.0000 g | Freq: Once | INTRAVENOUS | Status: AC
  Administered 2023-11-15: 1 g via INTRAVENOUS
  Filled 2023-11-15: qty 100

## 2023-11-15 MED ORDER — ACETAMINOPHEN 325 MG PO TABS
650.0000 mg | ORAL_TABLET | Freq: Four times a day (QID) | ORAL | Status: DC | PRN
  Administered 2023-11-16: 650 mg via ORAL
  Filled 2023-11-15 (×2): qty 2

## 2023-11-15 NOTE — Progress Notes (Signed)
[facility] ROUNDING   NOTE Alexandra Dorsey ZOX:[id number]  DOB: 1976-06-01  DOA: 11/14/2023  PCP: Wilfrid Lund, PA  11/15/2023,6:58 AM   LOS: 1 day      Code Status: Full From: Home  current Dispo: Likely home   48 year old white female Previous EtOH, HTN, anxiety hypothyroid chronic prolonged QTc Hospitalized 11/25-11/27 accidental overdose trazodone Seroquel with cough syrup  Events 1/28 came to emergency [facility] Long cramps arms hands legs with multiple episodes of vomiting no hematemesis Sodium 132 potassium 3.1 chloride 73 calcium 7.9 phosphorus 0.8--WBC 15.2 hemoglobin 16.4 platelet 283 Lactic acid 9.4 cycling down CIWA >12  procedures None    Plan  Electrolyte abnormalities Presented with very low magnesium 0.8 calcium 7.9 and low Phos She is improved now continues on replacement Replace potassium with K. Dur 40 twice daily, replace magnesium this morning with 2 g IV, K-Phos 30 mill equivalents Expect will recover repeat all labs in a.m. Risk of moderate withdrawal Alcohol score is about 10-12 her last drink was on 1/26 so she should complete her withdrawal in the next 12 to 24 hours She is stable coherent and we will allow her diet Chronic anxiety Resumed Seroquel 200, trazodone 50, Klonopin has been held as she is on CIWA protocol Resume Lexapro 20 HTN Continue amlodipine 10 Reflux Continue PPI replacement Protonix Prolonged QTc Watch QTc on monitors  DVT prophylaxis: Heparin  Status is: Inpatient Remains inpatient appropriate because:   Requires consistent improvement of electrolytes likely can discharge home   Subjective: Looks fair, feels tingling in fingers and hands are better No chest pain Wants to eat  Objective + exam Vitals:   11/15/23 0514 11/15/23 0515 11/15/23 0530 11/15/23 0636  BP: (!) 128/94 (!) 128/94 (!) 133/105   Pulse: 97 98 97   Resp:  19 18   Temp:    98.2 F (36.8 C)  TempSrc:    Oral  SpO2:  96% 97%    There were no vitals filed  for this visit.  Examination: EOMI NCAT no focal deficit no icterus no pallor No wheeze rales rhonchi ROM intact although limited left shoulder CTAB no added sound Abdomen soft  Data Reviewed: reviewed   CBC    Component Value Date/Time   WBC 7.6 11/15/2023 0523   RBC 3.67 (L) 11/15/2023 0523   HGB 12.7 11/15/2023 0523   HCT 37.1 11/15/2023 0523   PLT 155 11/15/2023 0523   MCV 101.1 (H) 11/15/2023 0523   MCH 34.6 (H) 11/15/2023 0523   MCHC 34.2 11/15/2023 0523   RDW 13.4 11/15/2023 0523   LYMPHSABS 1.4 09/13/2023 0050   MONOABS 0.3 09/13/2023 0050   EOSABS 0.0 09/13/2023 0050   BASOSABS 0.0 09/13/2023 0050      Latest Ref Rng & Units 11/15/2023    5:23 AM 11/14/2023   10:33 PM 11/14/2023    6:13 PM  CMP  Glucose 70 - 99 mg/dL 84  811  914   BUN 6 - 20 mg/dL 8  9  8    Creatinine 0.44 - 1.00 mg/dL 7.82  9.56  2.13   Sodium 135 - 145 mmol/L 137  130  128   Potassium 3.5 - 5.1 mmol/L 2.7  4.9  3.1   Chloride 98 - 111 mmol/L 90  87  77   CO2 22 - 32 mmol/L 30     Calcium 8.9 - 10.3 mg/dL 7.1     Total Protein 6.5 - 8.1 g/dL 6.3  Total Bilirubin 0.0 - 1.2 mg/dL 0.5     Alkaline Phos 38 - 126 U/L 56     AST 15 - 41 U/L 32     ALT 0 - 44 U/L 9       Scheduled Meds:  allopurinol  300 mg Oral Daily   amLODipine  10 mg Oral Daily   folic acid  1 mg Oral Daily   heparin  5,000 Units Subcutaneous Q8H   levothyroxine  25 mcg Oral Q0600   LORazepam  0-4 mg Intravenous Q4H   Followed by   Melene Muller ON 11/17/2023] LORazepam  0-4 mg Intravenous Q8H   multivitamin with minerals  1 tablet Oral Daily   pantoprazole  40 mg Oral Daily   QUEtiapine  200 mg Oral QHS   sodium chloride flush  3 mL Intravenous Q12H   thiamine  100 mg Oral Daily   Or   thiamine  100 mg Intravenous Daily   traZODone  50 mg Oral QHS   Continuous Infusions:  lactated ringers 125 mL/hr at 11/15/23 0516   magnesium sulfate bolus IVPB 1 g (11/15/23 0634)   potassium PHOSPHATE 30 mmol in dextrose 5 %  250 mL infusion      Time 37  Rhetta Mura, MD  [facility]

## 2023-11-15 NOTE — H&P (Signed)
History and Physical    Alexandra Dorsey WUJ:[id number] DOB: [birth year] DOA: 11/14/2023  PCP: Wilfrid Lund, PA   Patient coming from: Home   Chief Complaint: N/V, muscle cramps   HPI: Alexandra Dorsey is a 48 y.o. female with medical history significant for alcohol abuse, anxiety, panic attacks, hypertension, insomnia, and gout who presents for evaluation of nausea, vomiting, and muscle cramps.   Patient drank a pint of liquor on 11/12/2023.  The following day, she was experiencing nausea with frequent vomiting all throughout the day.  She she then began to experience cramping of the muscles in her hands and forearms, as well as in her feet.  She continued to have some vomiting up until this afternoon.  Nausea is much improved now but she continues to experience cramps and spasms.  She reports drinking alcohol 3 or 4 times a week, consuming a pint of liquor per day when she is drinking.  She often goes 2 or 3 days without alcohol but has had withdrawal symptoms a few times in the past.  ED Course: Upon arrival to the ED, patient is found to be afebrile and saturating well on room air with elevated heart rate and elevated blood pressure.  Labs are most notable for creatinine 2.53, potassium 3.1, magnesium 0.8, calcium 7.9, WBC 15,200, hemoglobin 16.4, and lactic acid 9.4, later improving to 2.9.  Patient was treated with 2 L of NS, 2 g IV magnesium, 2 g IV calcium, 40 mEq oral potassium, 2 mg Ativan, thiamine, folate, and Maalox in the ED.  Review of Systems:  All other systems reviewed and apart from HPI, are negative.  Past Medical History:  Diagnosis Date   Alcohol abuse    Anxiety    Gout    Hypertension     History reviewed. No pertinent surgical history.  Social History:   reports that she has quit smoking. She has never used smokeless tobacco. She reports current alcohol use of about 10.0 standard drinks of alcohol per week. She reports that she does not use  drugs.  Allergies  Allergen Reactions   Lisinopril Cough   Norvasc [Amlodipine Besylate] Other (See Comments)    Feet hurts    Family History  Problem Relation Age of Onset   Hypertension Mother    Hypertension Father    Stroke Maternal Grandfather      Prior to Admission medications   Medication Sig Start Date End Date Taking? Authorizing Provider  acamprosate (CAMPRAL) 333 MG tablet Take 666 mg by mouth 3 (three) times daily with meals.   Yes [provider]  albuterol (VENTOLIN HFA) 108 (90 Base) MCG/ACT inhaler 1 puff as needed Inhalation every 4 hrs for 30 days 06/28/23  Yes [provider]  allopurinol (ZYLOPRIM) 300 MG tablet TAKE 1 TABLET BY MOUTH DAILY Orally Once a day   Yes [provider]  amLODipine (NORVASC) 10 MG tablet Take 10 mg by mouth daily.   Yes [provider]  Aspirin-Salicylamide-Caffeine (BC HEADACHE PO) Take 1 packet by mouth daily as needed.   Yes [provider]  clonazePAM (KLONOPIN) 1 MG tablet Take 1 mg by mouth 2 (two) times daily as needed for anxiety.   Yes [provider]  colchicine 0.6 MG tablet Take 0.3 mg by mouth daily.   Yes [provider]  escitalopram (LEXAPRO) 20 MG tablet Take 20 mg by mouth daily. 08/13/23  Yes [provider]  folic acid (FOLVITE) 1 MG tablet Take  1 tablet (1 mg total) by mouth daily. 04/28/21  Yes Rodolph Bong, MD  levothyroxine (SYNTHROID) 25 MCG tablet 1 tablet in the morning on an empty stomach Orally Once a day 01/04/23  Yes [provider]  omeprazole (PRILOSEC) 40 MG capsule 1 capsule 30 minutes before morning meal Orally Once a day 03/28/23  Yes [provider]  potassium chloride (KLOR-CON) 10 MEQ tablet Take 10 mEq by mouth daily. 02/23/21  Yes [provider]  QUEtiapine (SEROQUEL) 200 MG tablet Take 200 mg by mouth at bedtime.   Yes [provider]  traZODone (DESYREL) 50 MG tablet Take 50 mg by mouth  at bedtime. 12/20/21  Yes [provider]  acetaminophen (TYLENOL) 325 MG tablet Take 2 tablets (650 mg total) by mouth every 6 (six) hours as needed for mild pain (or Fever >/= 101). Patient not taking: Reported on 11/15/2023 04/27/21   Rodolph Bong, MD  ondansetron (ZOFRAN-ODT) 4 MG disintegrating tablet Take 1 tablet (4 mg total) by mouth every 8 (eight) hours as needed for nausea or vomiting. Patient not taking: Reported on 03/21/2022 11/30/21   Pricilla Loveless, MD  thiamine 100 MG tablet Take 1 tablet (100 mg total) by mouth daily. Patient not taking: Reported on 03/20/2022 04/28/21   Rodolph Bong, MD    Physical Exam: Vitals:   11/14/23 2139 11/15/23 0000 11/15/23 0100 11/15/23 0225  BP: (!) 154/109  (!) 151/102 (!) 129/103  Pulse: (!) 109  85 83  Resp: (!) 26   17  Temp: 98.1 F (36.7 C) 97.7 F (36.5 C)  98.2 F (36.8 C)  TempSrc: Oral Oral  Oral  SpO2: 97%  94% 96%    Constitutional: NAD, no pallor or diaphoresis   Eyes: PERTLA, lids and conjunctivae normal ENMT: Mucous membranes are moist. Posterior pharynx clear of any exudate or lesions.   Neck: supple, no masses  Respiratory: no wheezing, no crackles. No accessory muscle use.  Cardiovascular: S1 & S2 heard, regular rate and rhythm. No extremity edema.   Abdomen: Soft, no tenderness. Bowel sounds active.  Musculoskeletal: no clubbing / cyanosis. No joint deformity upper and lower extremities.   Skin: no significant rashes, lesions, ulcers. Warm, dry, well-perfused. Neurologic: CN 2-12 grossly intact. Sensation to light touch intact. Strength 5/5 in all 4 limbs. Alert and oriented.  Psychiatric: Anxious. Cooperative.    Labs and Imaging on Admission: I have personally reviewed following labs and imaging studies  CBC: Recent Labs  Lab 11/14/23 1807 11/14/23 1813 11/14/23 2233  WBC 15.2*  --   --   HGB 16.4* 18.4* 15.3*  HCT 46.7* 54.0* 45.0  MCV 99.6  --   --   PLT 283  --   --    Basic Metabolic  Panel: Recent Labs  Lab 11/14/23 1807 11/14/23 1813 11/14/23 2231 11/14/23 2233  NA 132* 128*  --  130*  K 3.1* 3.1*  --  4.9  CL 73* 77*  --  87*  CO2 31  --   --   --   GLUCOSE 147* 153*  --  122*  BUN 9 8  --  9  CREATININE 2.53* 2.50*  --  2.20*  CALCIUM 7.9*  --   --   --   MG 0.8*  --  1.8  --    GFR: CrCl cannot be calculated (Unknown ideal weight.). Liver Function Tests: Recent Labs  Lab 11/14/23 1807  AST 61*  ALT 12  ALKPHOS 85  BILITOT 1.0  PROT 8.9*  ALBUMIN 4.8   Recent Labs  Lab 11/14/23 1807  LIPASE 37   No results for input(s): "AMMONIA" in the last 168 hours. Coagulation Profile: No results for input(s): "INR", "PROTIME" in the last 168 hours. Cardiac Enzymes: No results for input(s): "CKTOTAL", "CKMB", "CKMBINDEX", "TROPONINI" in the last 168 hours. BNP (last 3 results) No results for input(s): "PROBNP" in the last 8760 hours. HbA1C: No results for input(s): "HGBA1C" in the last 72 hours. CBG: No results for input(s): "GLUCAP" in the last 168 hours. Lipid Profile: No results for input(s): "CHOL", "HDL", "LDLCALC", "TRIG", "CHOLHDL", "LDLDIRECT" in the last 72 hours. Thyroid Function Tests: No results for input(s): "TSH", "T4TOTAL", "FREET4", "T3FREE", "THYROIDAB" in the last 72 hours. Anemia Panel: No results for input(s): "VITAMINB12", "FOLATE", "FERRITIN", "TIBC", "IRON", "RETICCTPCT" in the last 72 hours. Urine analysis:    Component Value Date/Time   COLORURINE YELLOW 11/15/2023 0029   APPEARANCEUR CLOUDY (A) 11/15/2023 0029   LABSPEC 1.018 11/15/2023 0029   PHURINE 5.0 11/15/2023 0029   GLUCOSEU NEGATIVE 11/15/2023 0029   HGBUR NEGATIVE 11/15/2023 0029   BILIRUBINUR NEGATIVE 11/15/2023 0029   BILIRUBINUR negative 09/01/2017 1514   BILIRUBINUR neg 02/05/2015 1111   KETONESUR NEGATIVE 11/15/2023 0029   PROTEINUR 100 (A) 11/15/2023 0029   UROBILINOGEN negative (A) 09/01/2017 1514   UROBILINOGEN 0.2 01/02/2014 0027   NITRITE  NEGATIVE 11/15/2023 0029   LEUKOCYTESUR NEGATIVE 11/15/2023 0029   Sepsis Labs: @LABRCNTIP (procalcitonin:4,lacticidven:4) )No results found for this or any previous visit (from the past 240 hours).   Radiological Exams on Admission: No results found.  EKG: Independently reviewed. Sinus tachycardia, rate 127, PVCs, QTc 534 ms.    Assessment/Plan   1. AKI  - SCr is 2.53, up from 0.3 in November  - Likely prerenal azotemia in setting of N/V and anorexia  - Continue IVF hydration, renally-dose medications, repeat chem panel in am    2. Hypocalcemia; hypomagnesemia; hypokalemia  - Replaced in ED  - Repeat serum chemistries   3. Alcohol withdrawal  - Monitor with CIWA scoring, treat with Ativan, supplement vitamins, consult TOC   4. Hypertension  - Norvasc   5. Anxiety; insomnia  - Hold Lexapro while QT prolonged, continue Seroquel, trazodone   6. Hypothyroidism  - Synthroid    7. Prolonged QT interval  - Correct electrolytes, avoid QT-prolonging medications     DVT prophylaxis: sq heparin  Code Status: Full  Level of Care: Level of care: Progressive Family Communication: None present  Disposition Plan:  Patient is from: Home  Anticipated d/c is to: TBD Anticipated d/c date is: 11/18/23  Patient currently: Pending improved renal function and electrolytes, treatment of alcohol withdrawal  Consults called: None  Admission status: Inpatient     Briscoe Deutscher, MD [facility]  11/15/2023, 3:08 AM

## 2023-11-16 ENCOUNTER — Other Ambulatory Visit: Payer: Self-pay

## 2023-11-16 LAB — COMPREHENSIVE METABOLIC PANEL
ALT: 9 U/L (ref 0–44)
AST: 29 U/L (ref 15–41)
Albumin: 3.2 g/dL — ABNORMAL LOW (ref 3.5–5.0)
Alkaline Phosphatase: 54 U/L (ref 38–126)
Anion gap: 13 (ref 5–15)
BUN: 6 mg/dL (ref 6–20)
CO2: 32 mmol/L (ref 22–32)
Calcium: 7 mg/dL — ABNORMAL LOW (ref 8.9–10.3)
Chloride: 91 mmol/L — ABNORMAL LOW (ref 98–111)
Creatinine, Ser: 0.67 mg/dL (ref 0.44–1.00)
GFR, Estimated: >60 mL/min (ref >60)
Glucose, Bld: 87 mg/dL (ref 70–99)
Potassium: 2.1 mmol/L — CL (ref 3.5–5.1)
Sodium: 136 mmol/L (ref 135–145)
Total Bilirubin: 0.6 mg/dL (ref 0.0–1.2)
Total Protein: 5.8 g/dL — ABNORMAL LOW (ref 6.5–8.1)

## 2023-11-16 LAB — CBC
HCT: 35.9 % — ABNORMAL LOW (ref 36.0–46.0)
Hemoglobin: 12.1 g/dL (ref 12.0–15.0)
MCH: 34.8 pg — ABNORMAL HIGH (ref 26.0–34.0)
MCHC: 33.7 g/dL (ref 30.0–36.0)
MCV: 103.2 fL — ABNORMAL HIGH (ref 80.0–100.0)
Platelets: 149 10*3/uL — ABNORMAL LOW (ref 150–400)
RBC: 3.48 MIL/uL — ABNORMAL LOW (ref 3.87–5.11)
RDW: 13.4 % (ref 11.5–15.5)
WBC: 3.9 10*3/uL — ABNORMAL LOW (ref 4.0–10.5)
nRBC: 0 % (ref 0.0–0.2)

## 2023-11-16 LAB — MAGNESIUM: Magnesium: 1.6 mg/dL — ABNORMAL LOW (ref 1.7–2.4)

## 2023-11-16 LAB — PHOSPHORUS: Phosphorus: 4.2 mg/dL (ref 2.5–4.6)

## 2023-11-16 MED ORDER — POTASSIUM CHLORIDE CRYS ER 20 MEQ PO TBCR
40.0000 meq | EXTENDED_RELEASE_TABLET | Freq: Two times a day (BID) | ORAL | Status: DC
  Administered 2023-11-16 (×2): 40 meq via ORAL
  Filled 2023-11-16 (×2): qty 2

## 2023-11-16 MED ORDER — INFLUENZA VIRUS VACC SPLIT PF (FLUZONE) 0.5 ML IM SUSY
0.5000 mL | PREFILLED_SYRINGE | INTRAMUSCULAR | Status: DC
  Filled 2023-11-16: qty 0.5

## 2023-11-16 MED ORDER — POTASSIUM CHLORIDE CRYS ER 20 MEQ PO TBCR
40.0000 meq | EXTENDED_RELEASE_TABLET | Freq: Once | ORAL | Status: AC
  Administered 2023-11-16: 40 meq via ORAL
  Filled 2023-11-16: qty 2

## 2023-11-16 MED ORDER — MAGNESIUM SULFATE 2 GM/50ML IV SOLN
2.0000 g | Freq: Once | INTRAVENOUS | Status: AC
  Administered 2023-11-16: 2 g via INTRAVENOUS
  Filled 2023-11-16: qty 50

## 2023-11-16 MED ORDER — MAGNESIUM SULFATE 4 GM/100ML IV SOLN
4.0000 g | Freq: Once | INTRAVENOUS | Status: AC
  Administered 2023-11-16: 4 g via INTRAVENOUS
  Filled 2023-11-16 (×2): qty 100

## 2023-11-16 MED ORDER — POTASSIUM CHLORIDE 10 MEQ/100ML IV SOLN
10.0000 meq | INTRAVENOUS | Status: DC
  Administered 2023-11-16 (×2): 10 meq via INTRAVENOUS
  Filled 2023-11-16 (×2): qty 100

## 2023-11-16 MED ORDER — PNEUMOCOCCAL 20-VAL CONJ VACC 0.5 ML IM SUSY
0.5000 mL | PREFILLED_SYRINGE | INTRAMUSCULAR | Status: DC
  Filled 2023-11-16: qty 0.5

## 2023-11-16 MED ORDER — POTASSIUM CHLORIDE IN NACL 40-0.9 MEQ/L-% IV SOLN
INTRAVENOUS | Status: DC
  Filled 2023-11-16 (×4): qty 1000

## 2023-11-16 MED ORDER — SODIUM CHLORIDE 0.9 % IV SOLN: INTRAVENOUS | Status: DC

## 2023-11-16 NOTE — TOC Initial Note (Signed)
Transition of Care [facility]) - Initial/Assessment Note   Patient Details  Name: Alexandra Dorsey MRN: [id number] Date of Birth: Aug 24, 1976  Transition of Care [facility]) CM/SW Contact:    Ewing Schlein, LCSW Phone Number: 11/16/2023, 10:45 AM  Clinical Narrative:  [facility] consulted for ETOH use resources. CSW met with patient to discuss consult. Patient declined resources at this time.  Expected Discharge Plan: Home/Self Care Barriers to Discharge: Continued Medical Work up  Patient Goals and CMS Choice Patient states their goals for this hospitalization and ongoing recovery are:: Discharge home Choice offered to / list presented to : NA  Expected Discharge Plan and Services In-house Referral: Clinical Social Work Discharge Planning Services: NA Post Acute Care Choice: NA Living arrangements for the past 2 months: Apartment           DME Arranged: N/A DME Agency: NA  Prior Living Arrangements/Services Living arrangements for the past 2 months: Apartment Lives with:: Self Patient language and need for interpreter reviewed:: Yes Do you feel safe going back to the place where you live?: Yes      Need for Family Participation in Patient Care: No (Comment) Care giver support system in place?: Yes (comment) Criminal Activity/Legal Involvement Pertinent to Current Situation/Hospitalization: No - Comment as needed  Activities of Daily Living ADL Screening (condition at time of admission) Independently performs ADLs?: Yes (appropriate for developmental age) Is the patient deaf or have difficulty hearing?: No Does the patient have difficulty seeing, even when wearing glasses/contacts?: No Does the patient have difficulty concentrating, remembering, or making decisions?: No  Emotional Assessment Appearance:: Appears stated age Attitude/Demeanor/Rapport: Guarded Affect (typically observed): Appropriate Orientation: : Oriented to Self, Oriented to Place, Oriented to  Time, Oriented to  Situation Alcohol / Substance Use: Alcohol Use Psych Involvement: No (comment)  Admission diagnosis:  Hypocalcemia [E83.51] Dehydration [E86.0] Hypokalemia [E87.6] Hypomagnesemia [E83.42] AKI (acute kidney injury) (HCC) [N17.9] Acute kidney injury (HCC) [N17.9] Patient Active Problem List   Diagnosis Date Noted   AKI (acute kidney injury) (HCC) 11/14/2023   Cough 09/12/2023   Drug overdose, accidental or unintentional, initial encounter 09/11/2023   Hyponatremia 09/11/2023   Chronic alcohol use 09/11/2023   Tetrahydrocannabinol (THC) use disorder, mild, abuse 09/11/2023   Chronic anemia 09/11/2023   Leukocytosis 09/11/2023   Generalized anxiety disorder 09/11/2023   Hypothyroidism 09/11/2023   QT prolongation 03/20/2022   Fall at home, initial encounter 03/20/2022   Anxiety    Hypocalcemia 04/27/2021   Hypophosphatemia    Gastroenteritis    Nausea & vomiting 04/25/2021   Hypokalemia 04/25/2021   Hypomagnesemia 04/25/2021   Dehydration 04/25/2021   Acute lower UTI 04/25/2021   Transaminitis 04/25/2021   Nausea, vomiting and diarrhea    Altered mental status 06/06/2017   Alcohol dependence with withdrawal with complication (HCC)    Lactic acidosis    Chills 05/29/2017   Non-intractable vomiting with nausea 05/29/2017   Acute pharyngitis 05/29/2017   Sore throat 05/29/2017   Essential hypertension 11/13/2016   Panic attacks 11/13/2016   PCP:  Wilfrid Lund, PA Pharmacy:   [id number] - [place], [place] - [address] [place] [address] Phone: [phone] Fax: [phone]  Social Drivers of Health (SDOH) Social History: SDOH Screenings   Food Insecurity: No Food Insecurity (09/12/2023)  Housing: Low Risk  (09/12/2023)  Transportation Needs: No Transportation Needs (09/12/2023)  Utilities: Not At Risk (09/12/2023)  Tobacco Use: Medium Risk (11/15/2023)   SDOH Interventions:  Readmission Risk  Interventions    11/16/2023   10:45 AM  Readmission Risk Prevention Plan  Transportation Screening Complete  HRI or Home Care Consult Complete  Social Work Consult for Recovery Care Planning/Counseling Complete  Palliative Care Screening Not Applicable  Medication Review [employer]) Complete

## 2023-11-16 NOTE — Progress Notes (Signed)
TRH ROUNDING   NOTE LEIYA KEESEY [id number]  DOB: [birth year]  DOA: 11/14/2023  PCP: Wilfrid Lund, PA  11/16/2023,2:51 PM   LOS: 2 days      Code Status: Full From: Home  current Dispo: Likely home   48 year old white female Previous EtOH, HTN, anxiety hypothyroid chronic prolonged QTc Hospitalized 11/25-11/27 accidental overdose trazodone Seroquel with cough syrup  Events 1/28 came to [facility] Long cramps arms hands legs with multiple episodes of vomiting no hematemesis Sodium 132 potassium 3.1 chloride 73 calcium 7.9 phosphorus 0.8--WBC 15.2 hemoglobin 16.4 platelet 283 Lactic acid 9.4 cycling down CIWA >12  procedures None    Plan  Multiple severe electrolyte abnormalities Presented with very low magnesium 0.8 calcium 7.9 and low Phos Still requires IV replacement of magnesium 4 g Hypokalemia is not resolved so continue K-Dur 40 twice daily NS plus KCl 40 mEq and phosphorus was replaced She is not ready for discharge Risk of moderate withdrawal Withdrawal score ranging between 2-10-continue protocol for now-she seems a little better Chronic anxiety Resumed Seroquel 200, trazodone 50, Klonopin has been held as she is on CIWA protocol Resume Lexapro 20 HTN Continue amlodipine 10 Reflux Continue PPI replacement Protonix Prolonged QTc Watch QTc on monitors  DVT prophylaxis: Heparin  Status is: Inpatient Remains inpatient appropriate because:   Requires consistent improvement of electrolytes likely can discharge home Not ready for d/c today  Called father and updated as per patient request, but no answer   Subjective:  Several bouts of diarrhea probably secondary to the magnesium replacement Is tolerating some diet Seems less tremulous No fever no chills  Objective + exam Vitals:   11/16/23 0615 11/16/23 0900 11/16/23 0925 11/16/23 1303  BP: 100/68  101/67 109/83  Pulse: 91  85 79  Resp:  16 17 15   Temp:    98.3 F (36.8 C)  TempSrc:     Oral  SpO2:   (!) 89% 95%  Weight:      Height:       Filed Weights   11/15/23 1620  Weight: 59.2 kg    Examination:  Alert coherent no distress EOMI NCAT Chest clear S1-S2 no murmur-on monitors seems to be in sinus tach Abd soft nt nd no rebound  Data Reviewed: reviewed   CBC    Component Value Date/Time   WBC 3.9 (L) 11/16/2023 0421   RBC 3.48 (L) 11/16/2023 0421   HGB 12.1 11/16/2023 0421   HCT 35.9 (L) 11/16/2023 0421   PLT 149 (L) 11/16/2023 0421   MCV 103.2 (H) 11/16/2023 0421   MCH 34.8 (H) 11/16/2023 0421   MCHC 33.7 11/16/2023 0421   RDW 13.4 11/16/2023 0421   LYMPHSABS 1.4 09/13/2023 0050   MONOABS 0.3 09/13/2023 0050   EOSABS 0.0 09/13/2023 0050   BASOSABS 0.0 09/13/2023 0050      Latest Ref Rng & Units 11/16/2023    4:21 AM 11/15/2023    5:23 AM 11/14/2023   10:33 PM  CMP  Glucose 70 - 99 mg/dL 87  84  562   BUN 6 - 20 mg/dL 6  8  9    Creatinine 0.44 - 1.00 mg/dL 1.30  8.65  7.84   Sodium 135 - 145 mmol/L 136  137  130   Potassium 3.5 - 5.1 mmol/L 2.1  2.7  4.9   Chloride 98 - 111 mmol/L 91  90  87   CO2 22 - 32 mmol/L 32  30    Calcium 8.9 -  10.3 mg/dL 7.0  7.1    Total Protein 6.5 - 8.1 g/dL 5.8  6.3    Total Bilirubin 0.0 - 1.2 mg/dL 0.6  0.5    Alkaline Phos 38 - 126 U/L 54  56    AST 15 - 41 U/L 29  32    ALT 0 - 44 U/L 9  9      Scheduled Meds:  allopurinol  300 mg Oral Daily   amLODipine  10 mg Oral Daily   escitalopram  20 mg Oral Daily   folic acid  1 mg Oral Daily   heparin  5,000 Units Subcutaneous Q8H   [START ON 11/17/2023] influenza vac split trivalent PF  0.5 mL Intramuscular Tomorrow-1000   levothyroxine  25 mcg Oral Q0600   LORazepam  0-4 mg Intravenous Q4H   Followed by   Melene Muller ON 11/17/2023] LORazepam  0-4 mg Intravenous Q8H   multivitamin with minerals  1 tablet Oral Daily   pantoprazole  40 mg Oral Daily   [START ON 11/17/2023] pneumococcal 20-valent conjugate vaccine  0.5 mL Intramuscular Tomorrow-1000   potassium  chloride  40 mEq Oral BID   QUEtiapine  200 mg Oral QHS   sodium chloride flush  3 mL Intravenous Q12H   thiamine  100 mg Oral Daily   Or   thiamine  100 mg Intravenous Daily   traZODone  50 mg Oral QHS   Continuous Infusions:  0.9 % NaCl with KCl 40 mEq / L 100 mL/hr at 11/16/23 0835   magnesium sulfate bolus IVPB 4 g (11/16/23 1351)    Time 27  Rhetta Mura, MD  [facility]

## 2023-11-16 NOTE — Plan of Care (Signed)
Problem: Coping: Goal: Level of anxiety will decrease Outcome: Progressing

## 2023-11-17 ENCOUNTER — Other Ambulatory Visit (HOSPITAL_COMMUNITY): Payer: Self-pay

## 2023-11-17 LAB — COMPREHENSIVE METABOLIC PANEL
ALT: 11 U/L (ref 0–44)
AST: 34 U/L (ref 15–41)
Albumin: 2.9 g/dL — ABNORMAL LOW (ref 3.5–5.0)
Alkaline Phosphatase: 55 U/L (ref 38–126)
Anion gap: 8 (ref 5–15)
BUN: 7 mg/dL (ref 6–20)
CO2: 23 mmol/L (ref 22–32)
Calcium: 7.1 mg/dL — ABNORMAL LOW (ref 8.9–10.3)
Chloride: 102 mmol/L (ref 98–111)
Creatinine, Ser: 0.42 mg/dL — ABNORMAL LOW (ref 0.44–1.00)
GFR, Estimated: >60 mL/min (ref >60)
Glucose, Bld: 81 mg/dL (ref 70–99)
Potassium: 4.2 mmol/L (ref 3.5–5.1)
Sodium: 133 mmol/L — ABNORMAL LOW (ref 135–145)
Total Bilirubin: 0.3 mg/dL (ref 0.0–1.2)
Total Protein: 5.3 g/dL — ABNORMAL LOW (ref 6.5–8.1)

## 2023-11-17 LAB — CBC
HCT: 34.1 % — ABNORMAL LOW (ref 36.0–46.0)
Hemoglobin: 11.3 g/dL — ABNORMAL LOW (ref 12.0–15.0)
MCH: 35.1 pg — ABNORMAL HIGH (ref 26.0–34.0)
MCHC: 33.1 g/dL (ref 30.0–36.0)
MCV: 105.9 fL — ABNORMAL HIGH (ref 80.0–100.0)
Platelets: 168 10*3/uL (ref 150–400)
RBC: 3.22 MIL/uL — ABNORMAL LOW (ref 3.87–5.11)
RDW: 13.3 % (ref 11.5–15.5)
WBC: 4.6 10*3/uL (ref 4.0–10.5)
nRBC: 0 % (ref 0.0–0.2)

## 2023-11-17 MED ORDER — POTASSIUM CHLORIDE CRYS ER 20 MEQ PO TBCR
40.0000 meq | EXTENDED_RELEASE_TABLET | Freq: Every day | ORAL | Status: DC
  Administered 2023-11-17: 40 meq via ORAL
  Filled 2023-11-17: qty 2

## 2023-11-17 MED ORDER — ACETAMINOPHEN 325 MG PO TABS: 650.0000 mg | ORAL_TABLET | Freq: Four times a day (QID) | ORAL | Status: AC | PRN

## 2023-11-17 MED ORDER — POTASSIUM CHLORIDE CRYS ER 20 MEQ PO TBCR
40.0000 meq | EXTENDED_RELEASE_TABLET | Freq: Every day | ORAL | 0 refills | Status: DC
  Filled 2023-11-17: qty 6, 3d supply, fill #0

## 2023-11-17 NOTE — Plan of Care (Signed)

## 2023-11-17 NOTE — Plan of Care (Signed)

## 2023-11-17 NOTE — Progress Notes (Addendum)
Discharge instructions reviewed with patient. All questions answered. All belongings accounted for. Patient to follow up with MD in  1 weeks.  Patient medications hand delivered from outpatient pharmacy. As well as meds that were stored with inpatient pharmacy.  PIV removed. Assisted via WC to private vehicle.

## 2023-11-17 NOTE — Discharge Summary (Signed)
Physician Discharge Summary  Alexandra Dorsey [id number]:[id number] DOB: 01-Jul-1976 DOA: 11/14/2023  PCP: Wilfrid Lund, PA  Admit date: 11/14/2023 Discharge date: 11/17/2023  Time spent: 26 minutes  Recommendations for Outpatient Follow-up:  Requires Chem-12 CBC 1 week Please follow-up with patient with regards to her cessation counseling as well as alcoholism Recommend routine [place] PTF guidelines apply to her in the outpatient setting May benefit from referral to her psychiatrist  Discharge Diagnoses:  MAIN problem for hospitalization   Severe electrolyte abnormalities with hypomagnesemia hypokalemia hypophosphatemia likely secondary to 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  Please see below for itemized issues addressed in HOpsital- refer to other progress notes for clarity if needed  Discharge Condition: Improved  Diet recommendation: Heart healthy  Filed Weights   11/15/23 1620  Weight: 59.2 kg    History of present illness:  48 year old white female Previous EtOH, HTN, anxiety hypothyroid chronic prolonged QTc Hospitalized 11/25-11/27 accidental overdose trazodone Seroquel with cough syrup   Events 1/28 came to [facility] Long cramps arms hands legs with multiple episodes of vomiting no hematemesis Sodium 132 potassium 3.1 chloride 73 calcium 7.9 phosphorus 0.8--WBC 15.2 hemoglobin 16.4 platelet 283 Lactic acid 9.4 cycling down CIWA >12   procedures None       Plan   Multiple severe electrolyte abnormalities Presented with very low magnesium 0.8 calcium 7.9 and low Phos Required several rounds of replacement with IV electrolytes Hypokalemia finally resolved and she will go home on a higher dose of potassium 40 twice daily She should get Chem-12 magnesium and calcium in about 1 week and I have encouraged her to take higher dose of potassium replacement eat potassium rich foods and follow-up with her outpatient physician Risk of moderate withdrawal Withdrawal score was  elevated at up to the 11 range but came down to some degree during hospital stay Cessation counseling has been done-she has been encouraged to quit drinking Chronic anxiety Resumed Seroquel 200, trazodone 50, Klonopin resumed at discharge as we discontinued the Ativan CIWA protocol Resume Lexapro 20 HTN Continue amlodipine 10 Reflux Continue Protonix as an outpatient and or other Prolonged QTc Watch QTc on monitors  Discharge Exam: Vitals:   11/17/23 0341 11/17/23 0513  BP: 112/81 118/80  Pulse: 90 78  Resp:  15  Temp:  98.5 F (36.9 C)  SpO2:  92%    Subj on day of d/c   Awake coherent no distress EOMI NCAT more alert Having breakfast No chest pain Still feels a little bit ill but overall much improved from prior objectively  General Exam on discharge  EOMI NCAT no focal deficit no icterus no pallor Flat affect S1-S2 no murmur Abdomen soft no rebound ROM intact No lower extremity edema  Discharge Instructions   Discharge Instructions     Diet - low sodium heart healthy   Complete by: As directed    Discharge instructions   Complete by: As directed    Make sure that you take the potassium pills and eat high potassium containing foods such as bananas and sweet potato-drinking alcohol will cause your electrolytes to be off and it is advisable that you abstain from the same Resources will be given to you in the outpatient setting to assist you with cessation habits etc. Please make sure that you follow-up with your primary physician and get labs in about a week At their office Please follow-up with the orthopedics for your shoulder issue You will not be prescribed Ativan he can continue Klonopin your other  medications as previously-only use colchicine if you have a severe gout flare-allopurinol is okay for maintenance   Increase activity slowly   Complete by: As directed       Allergies as of 11/17/2023       Reactions   Lisinopril Cough   Norvasc  [amlodipine Besylate] Other (See Comments)   Feet hurts        Medication List     STOP taking these medications    acamprosate 333 MG tablet Commonly known as: CAMPRAL   BC HEADACHE PO   colchicine 0.6 MG tablet   potassium chloride 10 MEQ tablet Commonly known as: KLOR-CON   thiamine 100 MG tablet Commonly known as: VITAMIN B1       TAKE these medications    acetaminophen 325 MG tablet Commonly known as: TYLENOL Take 2 tablets (650 mg total) by mouth every 6 (six) hours as needed for mild pain (or Fever >/= 101). What changed: Another medication with the same name was added. Make sure you understand how and when to take each.   acetaminophen 325 MG tablet Commonly known as: TYLENOL Take 2 tablets (650 mg total) by mouth every 6 (six) hours as needed for mild pain (pain score 1-3) (or Fever >/= 101). What changed: You were already taking a medication with the same name, and this prescription was added. Make sure you understand how and when to take each.   albuterol 108 (90 Base) MCG/ACT inhaler Commonly known as: VENTOLIN HFA 1 puff as needed Inhalation every 4 hrs for 30 days   allopurinol 300 MG tablet Commonly known as: ZYLOPRIM TAKE 1 TABLET BY MOUTH DAILY Orally Once a day   amLODipine 10 MG tablet Commonly known as: NORVASC Take 10 mg by mouth daily.   clonazePAM 1 MG tablet Commonly known as: KLONOPIN Take 1 mg by mouth 2 (two) times daily as needed for anxiety.   escitalopram 20 MG tablet Commonly known as: LEXAPRO Take 20 mg by mouth daily.   folic acid 1 MG tablet Commonly known as: FOLVITE Take 1 tablet (1 mg total) by mouth daily.   levothyroxine 25 MCG tablet Commonly known as: SYNTHROID 1 tablet in the morning on an empty stomach Orally Once a day   omeprazole 40 MG capsule Commonly known as: PRILOSEC 1 capsule 30 minutes before morning meal Orally Once a day   ondansetron 4 MG disintegrating tablet Commonly known as:  ZOFRAN-ODT Take 1 tablet (4 mg total) by mouth every 8 (eight) hours as needed for nausea or vomiting.   potassium chloride SA 20 MEQ tablet Commonly known as: KLOR-CON M Take 2 tablets (40 mEq total) by mouth daily.   QUEtiapine 200 MG tablet Commonly known as: SEROQUEL Take 200 mg by mouth at bedtime.   traZODone 50 MG tablet Commonly known as: DESYREL Take 50 mg by mouth at bedtime.       Allergies  Allergen Reactions   Lisinopril Cough   Norvasc [Amlodipine Besylate] Other (See Comments)    Feet hurts      The results of significant diagnostics from this hospitalization (including imaging, microbiology, ancillary and laboratory) are listed below for reference.    Significant Diagnostic Studies: No results found.  Microbiology: No results found for this or any previous visit (from the past 240 hours).   Labs: Basic Metabolic Panel: Recent Labs  Lab 11/14/23 1807 11/14/23 1813 11/14/23 2231 11/14/23 2233 11/15/23 0523 11/16/23 0421 11/17/23 0356  NA 132* 128*  --  130* 137 136 133*  K 3.1* 3.1*  --  4.9 2.7* 2.1* 4.2  CL 73* 77*  --  87* 90* 91* 102  CO2 31  --   --   --  30 32 23  GLUCOSE 147* 153*  --  122* 84 87 81  BUN 9 8  --  9 8 6 7   CREATININE 2.53* 2.50*  --  2.20* 1.14* 0.67 0.42*  CALCIUM 7.9*  --   --   --  7.1* 7.0* 7.1*  MG 0.8*  --  1.8  --  1.6* 1.6*  --   PHOS  --   --   --   --  1.6* 4.2  --    Liver Function Tests: Recent Labs  Lab 11/14/23 1807 11/15/23 0523 11/16/23 0421 11/17/23 0356  AST 61* 32 29 34  ALT 12 9 9 11   ALKPHOS 85 56 54 55  BILITOT 1.0 0.5 0.6 0.3  PROT 8.9* 6.3* 5.8* 5.3*  ALBUMIN 4.8 3.6 3.2* 2.9*   Recent Labs  Lab 11/14/23 1807  LIPASE 37   No results for input(s): "AMMONIA" in the last 168 hours. CBC: Recent Labs  Lab 11/14/23 1807 11/14/23 1813 11/14/23 2233 11/15/23 0523 11/16/23 0421 11/17/23 0356  WBC 15.2*  --   --  7.6 3.9* 4.6  HGB 16.4* 18.4* 15.3* 12.7 12.1 11.3*  HCT 46.7*  54.0* 45.0 37.1 35.9* 34.1*  MCV 99.6  --   --  101.1* 103.2* 105.9*  PLT 283  --   --  155 149* 168   Cardiac Enzymes: No results for input(s): "CKTOTAL", "CKMB", "CKMBINDEX", "TROPONINI" in the last 168 hours. BNP: BNP (last 3 results) No results for input(s): "BNP" in the last 8760 hours.  ProBNP (last 3 results) No results for input(s): "PROBNP" in the last 8760 hours.  CBG: No results for input(s): "GLUCAP" in the last 168 hours.  Signed:  Rhetta Mura MD   [facility] 11/17/2023, 9:44 AM

## 2023-11-23 ENCOUNTER — Inpatient Hospital Stay (HOSPITAL_COMMUNITY): Payer: MEDICAID

## 2023-11-23 ENCOUNTER — Encounter (HOSPITAL_COMMUNITY): Payer: Self-pay

## 2023-11-23 ENCOUNTER — Emergency Department (HOSPITAL_COMMUNITY): Payer: MEDICAID

## 2023-11-23 ENCOUNTER — Emergency Department (HOSPITAL_COMMUNITY): Payer: 59

## 2023-11-23 ENCOUNTER — Inpatient Hospital Stay (HOSPITAL_COMMUNITY)
Admission: EM | Admit: 2023-11-23 | Discharge: 2023-11-29 | DRG: 100 | Disposition: A | Discharge status: Home / Self Care | Payer: 59 | Attending: Student | Admitting: Student

## 2023-11-23 DIAGNOSIS — Z87891 Personal history of nicotine dependence: Secondary | ICD-10-CM

## 2023-11-23 DIAGNOSIS — E876 Hypokalemia: Secondary | ICD-10-CM | POA: Diagnosis not present

## 2023-11-23 DIAGNOSIS — Z79899 Other long term (current) drug therapy: Secondary | ICD-10-CM | POA: Diagnosis not present

## 2023-11-23 DIAGNOSIS — Z8249 Family history of ischemic heart disease and other diseases of the circulatory system: Secondary | ICD-10-CM

## 2023-11-23 DIAGNOSIS — R569 Unspecified convulsions: Secondary | ICD-10-CM | POA: Diagnosis not present

## 2023-11-23 DIAGNOSIS — S42302A Unspecified fracture of shaft of humerus, left arm, initial encounter for closed fracture: Secondary | ICD-10-CM | POA: Diagnosis not present

## 2023-11-23 DIAGNOSIS — D75839 Thrombocytosis, unspecified: Secondary | ICD-10-CM | POA: Diagnosis not present

## 2023-11-23 DIAGNOSIS — F419 Anxiety disorder, unspecified: Secondary | ICD-10-CM | POA: Diagnosis present

## 2023-11-23 DIAGNOSIS — S42452A Displaced fracture of lateral condyle of left humerus, initial encounter for closed fracture: Secondary | ICD-10-CM | POA: Diagnosis not present

## 2023-11-23 DIAGNOSIS — Z823 Family history of stroke: Secondary | ICD-10-CM | POA: Diagnosis not present

## 2023-11-23 DIAGNOSIS — E039 Hypothyroidism, unspecified: Secondary | ICD-10-CM | POA: Diagnosis not present

## 2023-11-23 DIAGNOSIS — W19XXXA Unspecified fall, initial encounter: Secondary | ICD-10-CM | POA: Diagnosis not present

## 2023-11-23 DIAGNOSIS — E512 Wernicke's encephalopathy: Secondary | ICD-10-CM | POA: Diagnosis not present

## 2023-11-23 DIAGNOSIS — G9341 Metabolic encephalopathy: Secondary | ICD-10-CM | POA: Diagnosis present

## 2023-11-23 DIAGNOSIS — D72829 Elevated white blood cell count, unspecified: Secondary | ICD-10-CM | POA: Diagnosis present

## 2023-11-23 DIAGNOSIS — F10231 Alcohol dependence with withdrawal delirium: Secondary | ICD-10-CM | POA: Diagnosis present

## 2023-11-23 DIAGNOSIS — J9600 Acute respiratory failure, unspecified whether with hypoxia or hypercapnia: Secondary | ICD-10-CM | POA: Diagnosis present

## 2023-11-23 DIAGNOSIS — I10 Essential (primary) hypertension: Secondary | ICD-10-CM | POA: Diagnosis not present

## 2023-11-23 DIAGNOSIS — N179 Acute kidney failure, unspecified: Secondary | ICD-10-CM | POA: Diagnosis not present

## 2023-11-23 DIAGNOSIS — R45851 Suicidal ideations: Secondary | ICD-10-CM | POA: Diagnosis present

## 2023-11-23 DIAGNOSIS — F10230 Alcohol dependence with withdrawal, uncomplicated: Secondary | ICD-10-CM | POA: Diagnosis not present

## 2023-11-23 DIAGNOSIS — F10931 Alcohol use, unspecified with withdrawal delirium: Secondary | ICD-10-CM

## 2023-11-23 DIAGNOSIS — J9601 Acute respiratory failure with hypoxia: Secondary | ICD-10-CM | POA: Diagnosis not present

## 2023-11-23 DIAGNOSIS — W010XXA Fall on same level from slipping, tripping and stumbling without subsequent striking against object, initial encounter: Secondary | ICD-10-CM | POA: Diagnosis present

## 2023-11-23 DIAGNOSIS — I213 ST elevation (STEMI) myocardial infarction of unspecified site: Secondary | ICD-10-CM | POA: Diagnosis not present

## 2023-11-23 DIAGNOSIS — Z881 Allergy status to other antibiotic agents status: Secondary | ICD-10-CM

## 2023-11-23 DIAGNOSIS — Z79890 Hormone replacement therapy (postmenopausal): Secondary | ICD-10-CM

## 2023-11-23 DIAGNOSIS — E8729 Other acidosis: Secondary | ICD-10-CM | POA: Diagnosis not present

## 2023-11-23 DIAGNOSIS — R404 Transient alteration of awareness: Secondary | ICD-10-CM | POA: Diagnosis not present

## 2023-11-23 DIAGNOSIS — M109 Gout, unspecified: Secondary | ICD-10-CM | POA: Diagnosis present

## 2023-11-23 DIAGNOSIS — G40901 Epilepsy, unspecified, not intractable, with status epilepticus: Secondary | ICD-10-CM | POA: Diagnosis not present

## 2023-11-23 DIAGNOSIS — Z1152 Encounter for screening for COVID-19: Secondary | ICD-10-CM | POA: Diagnosis not present

## 2023-11-23 DIAGNOSIS — R55 Syncope and collapse: Secondary | ICD-10-CM | POA: Diagnosis not present

## 2023-11-23 DIAGNOSIS — R4182 Altered mental status, unspecified: Secondary | ICD-10-CM | POA: Diagnosis not present

## 2023-11-23 DIAGNOSIS — Z888 Allergy status to other drugs, medicaments and biological substances status: Secondary | ICD-10-CM

## 2023-11-23 LAB — URINALYSIS, W/ REFLEX TO CULTURE (INFECTION SUSPECTED)
Bilirubin Urine: NEGATIVE
Glucose, UA: NEGATIVE mg/dL
Hgb urine dipstick: NEGATIVE
Ketones, ur: NEGATIVE mg/dL
Leukocytes,Ua: NEGATIVE
Nitrite: NEGATIVE
Protein, ur: NEGATIVE mg/dL
Specific Gravity, Urine: 1.02 (ref 1.005–1.030)
pH: 6 (ref 5.0–8.0)

## 2023-11-23 LAB — COMPREHENSIVE METABOLIC PANEL
ALT: 15 U/L (ref 0–44)
AST: 29 U/L (ref 15–41)
Albumin: 3.3 g/dL — ABNORMAL LOW (ref 3.5–5.0)
Alkaline Phosphatase: 58 U/L (ref 38–126)
Anion gap: 19 — ABNORMAL HIGH (ref 5–15)
BUN: 21 mg/dL — ABNORMAL HIGH (ref 6–20)
CO2: 22 mmol/L (ref 22–32)
Calcium: 8.6 mg/dL — ABNORMAL LOW (ref 8.9–10.3)
Chloride: 95 mmol/L — ABNORMAL LOW (ref 98–111)
Creatinine, Ser: 2.23 mg/dL — ABNORMAL HIGH (ref 0.44–1.00)
GFR, Estimated: 27 mL/min — ABNORMAL LOW (ref >60)
Glucose, Bld: 120 mg/dL — ABNORMAL HIGH (ref 70–99)
Potassium: 3.6 mmol/L (ref 3.5–5.1)
Sodium: 136 mmol/L (ref 135–145)
Total Bilirubin: 0.3 mg/dL (ref 0.0–1.2)
Total Protein: 6.3 g/dL — ABNORMAL LOW (ref 6.5–8.1)

## 2023-11-23 LAB — CREATININE, SERUM
Creatinine, Ser: 1.38 mg/dL — ABNORMAL HIGH (ref 0.44–1.00)
GFR, Estimated: 48 mL/min — ABNORMAL LOW (ref >60)

## 2023-11-23 LAB — CBC
HCT: 29.4 % — ABNORMAL LOW (ref 36.0–46.0)
Hemoglobin: 10.4 g/dL — ABNORMAL LOW (ref 12.0–15.0)
MCH: 35.3 pg — ABNORMAL HIGH (ref 26.0–34.0)
MCHC: 35.4 g/dL (ref 30.0–36.0)
MCV: 99.7 fL (ref 80.0–100.0)
Platelets: 192 10*3/uL (ref 150–400)
RBC: 2.95 MIL/uL — ABNORMAL LOW (ref 3.87–5.11)
RDW: 13.7 % (ref 11.5–15.5)
WBC: 10.9 10*3/uL — ABNORMAL HIGH (ref 4.0–10.5)
nRBC: 0 % (ref 0.0–0.2)

## 2023-11-23 LAB — I-STAT ARTERIAL BLOOD GAS, ED
Acid-base deficit: 1 mmol/L (ref 0.0–2.0)
Bicarbonate: 25.7 mmol/L (ref 20.0–28.0)
Calcium, Ion: 1.12 mmol/L — ABNORMAL LOW (ref 1.15–1.40)
HCT: 34 % — ABNORMAL LOW (ref 36.0–46.0)
Hemoglobin: 11.6 g/dL — ABNORMAL LOW (ref 12.0–15.0)
O2 Saturation: 100 %
Patient temperature: 97.4
Potassium: 3.6 mmol/L (ref 3.5–5.1)
Sodium: 134 mmol/L — ABNORMAL LOW (ref 135–145)
TCO2: 27 mmol/L (ref 22–32)
pCO2 arterial: 50.2 mm[Hg] — ABNORMAL HIGH (ref 32–48)
pH, Arterial: 7.314 — ABNORMAL LOW (ref 7.35–7.45)
pO2, Arterial: 356 mm[Hg] — ABNORMAL HIGH (ref 83–108)

## 2023-11-23 LAB — AMMONIA: Ammonia: 32 umol/L (ref 9–35)

## 2023-11-23 LAB — CBC WITH DIFFERENTIAL/PLATELET
Abs Immature Granulocytes: 0.12 10*3/uL — ABNORMAL HIGH (ref 0.00–0.07)
Basophils Absolute: 0 10*3/uL (ref 0.0–0.1)
Basophils Relative: 0 %
Eosinophils Absolute: 0 10*3/uL (ref 0.0–0.5)
Eosinophils Relative: 0 %
HCT: 34.1 % — ABNORMAL LOW (ref 36.0–46.0)
Hemoglobin: 11.5 g/dL — ABNORMAL LOW (ref 12.0–15.0)
Immature Granulocytes: 1 %
Lymphocytes Relative: 8 %
Lymphs Abs: 1.1 10*3/uL (ref 0.7–4.0)
MCH: 35.1 pg — ABNORMAL HIGH (ref 26.0–34.0)
MCHC: 33.7 g/dL (ref 30.0–36.0)
MCV: 104 fL — ABNORMAL HIGH (ref 80.0–100.0)
Monocytes Absolute: 1.5 10*3/uL — ABNORMAL HIGH (ref 0.1–1.0)
Monocytes Relative: 11 %
Neutro Abs: 11.2 10*3/uL — ABNORMAL HIGH (ref 1.7–7.7)
Neutrophils Relative %: 80 %
Platelets: 240 10*3/uL (ref 150–400)
RBC: 3.28 MIL/uL — ABNORMAL LOW (ref 3.87–5.11)
RDW: 13.8 % (ref 11.5–15.5)
WBC: 14 10*3/uL — ABNORMAL HIGH (ref 4.0–10.5)
nRBC: 0 % (ref 0.0–0.2)

## 2023-11-23 LAB — RAPID URINE DRUG SCREEN, HOSP PERFORMED
Amphetamines: NOT DETECTED
Barbiturates: NOT DETECTED
Benzodiazepines: POSITIVE — AB
Cocaine: NOT DETECTED
Opiates: NOT DETECTED
Tetrahydrocannabinol: NOT DETECTED

## 2023-11-23 LAB — RESP PANEL BY RT-PCR (RSV, FLU A&B, COVID)  RVPGX2
Influenza A by PCR: NEGATIVE
Influenza B by PCR: NEGATIVE
Resp Syncytial Virus by PCR: NEGATIVE
SARS Coronavirus 2 by RT PCR: NEGATIVE

## 2023-11-23 LAB — HCG, SERUM, QUALITATIVE: Preg, Serum: NEGATIVE

## 2023-11-23 LAB — CK: Total CK: 29 U/L — ABNORMAL LOW (ref 38–234)

## 2023-11-23 LAB — APTT: aPTT: 25 s (ref 24–36)

## 2023-11-23 LAB — I-STAT CG4 LACTIC ACID, ED: Lactic Acid, Venous: 4.5 mmol/L (ref 0.5–1.9)

## 2023-11-23 LAB — GLUCOSE, CAPILLARY
Glucose-Capillary: 100 mg/dL — ABNORMAL HIGH (ref 70–99)
Glucose-Capillary: 134 mg/dL — ABNORMAL HIGH (ref 70–99)
Glucose-Capillary: 148 mg/dL — ABNORMAL HIGH (ref 70–99)

## 2023-11-23 LAB — PROTIME-INR
INR: 0.9 (ref 0.8–1.2)
Prothrombin Time: 12.5 s (ref 11.4–15.2)

## 2023-11-23 LAB — PHOSPHORUS: Phosphorus: 3.9 mg/dL (ref 2.5–4.6)

## 2023-11-23 LAB — LACTIC ACID, PLASMA
Lactic Acid, Venous: 1.6 mmol/L (ref 0.5–1.9)
Lactic Acid, Venous: 1.7 mmol/L (ref 0.5–1.9)

## 2023-11-23 LAB — SALICYLATE LEVEL: Salicylate Lvl: <7 mg/dL — ABNORMAL LOW (ref 7.0–30.0)

## 2023-11-23 LAB — ETHANOL: Alcohol, Ethyl (B): <10 mg/dL (ref <10)

## 2023-11-23 LAB — PROCALCITONIN: Procalcitonin: <0.1 ng/mL

## 2023-11-23 LAB — ACETAMINOPHEN LEVEL: Acetaminophen (Tylenol), Serum: <10 ug/mL — ABNORMAL LOW (ref 10–30)

## 2023-11-23 LAB — MRSA NEXT GEN BY PCR, NASAL: MRSA by PCR Next Gen: NOT DETECTED

## 2023-11-23 LAB — MAGNESIUM: Magnesium: 0.9 mg/dL — CL (ref 1.7–2.4)

## 2023-11-23 MED ORDER — ADULT MULTIVITAMIN LIQUID CH
15.0000 mL | Freq: Every day | ORAL | Status: DC
  Administered 2023-11-24: 15 mL via ORAL
  Filled 2023-11-23: qty 15

## 2023-11-23 MED ORDER — PANTOPRAZOLE SODIUM 40 MG IV SOLR
40.0000 mg | Freq: Every day | INTRAVENOUS | Status: DC
  Administered 2023-11-23 – 2023-11-26 (×4): 40 mg via INTRAVENOUS
  Filled 2023-11-23 (×5): qty 10

## 2023-11-23 MED ORDER — ALBUTEROL SULFATE (2.5 MG/3ML) 0.083% IN NEBU
2.5000 mg | INHALATION_SOLUTION | RESPIRATORY_TRACT | Status: DC
  Administered 2023-11-23 – 2023-11-24 (×4): 2.5 mg via RESPIRATORY_TRACT
  Filled 2023-11-23 (×3): qty 3

## 2023-11-23 MED ORDER — FOLIC ACID 1 MG PO TABS
1.0000 mg | ORAL_TABLET | Freq: Every day | ORAL | Status: DC
  Administered 2023-11-24: 1 mg
  Filled 2023-11-23: qty 1

## 2023-11-23 MED ORDER — FENTANYL CITRATE PF 50 MCG/ML IJ SOSY
50.0000 ug | PREFILLED_SYRINGE | Freq: Once | INTRAMUSCULAR | Status: AC
  Administered 2023-11-23: 50 ug via INTRAVENOUS

## 2023-11-23 MED ORDER — PROPOFOL 1000 MG/100ML IV EMUL
20.0000 ug/kg/min | INTRAVENOUS | Status: DC
  Administered 2023-11-24: 20 ug/kg/min via INTRAVENOUS
  Filled 2023-11-23 (×2): qty 100

## 2023-11-23 MED ORDER — ORAL CARE MOUTH RINSE: 15.0000 mL | OROMUCOSAL | Status: DC | PRN

## 2023-11-23 MED ORDER — THIAMINE HCL 100 MG/ML IJ SOLN: 500.0000 mg | Freq: Three times a day (TID) | INTRAVENOUS | Status: DC

## 2023-11-23 MED ORDER — SODIUM CHLORIDE 0.9 % IV SOLN
500.0000 mg | Freq: Once | INTRAVENOUS | Status: AC
  Administered 2023-11-23: 500 mg via INTRAVENOUS
  Filled 2023-11-23: qty 5

## 2023-11-23 MED ORDER — ALBUTEROL SULFATE (2.5 MG/3ML) 0.083% IN NEBU: 2.5000 mg | INHALATION_SOLUTION | RESPIRATORY_TRACT | Status: DC | PRN

## 2023-11-23 MED ORDER — ORAL CARE MOUTH RINSE
15.0000 mL | OROMUCOSAL | Status: DC
  Administered 2023-11-23 – 2023-11-25 (×18): 15 mL via OROMUCOSAL

## 2023-11-23 MED ORDER — FENTANYL 2500MCG IN NS 250ML (10MCG/ML) PREMIX INFUSION
50.0000 ug/h | INTRAVENOUS | Status: DC
  Administered 2023-11-23: 50 ug/h via INTRAVENOUS
  Filled 2023-11-23 (×2): qty 250

## 2023-11-23 MED ORDER — DOCUSATE SODIUM 100 MG PO CAPS: 100.0000 mg | ORAL_CAPSULE | Freq: Two times a day (BID) | ORAL | Status: DC | PRN

## 2023-11-23 MED ORDER — ACETAMINOPHEN 650 MG RE SUPP: 650.0000 mg | RECTAL | Status: DC | PRN

## 2023-11-23 MED ORDER — FENTANYL CITRATE PF 50 MCG/ML IJ SOSY
50.0000 ug | PREFILLED_SYRINGE | INTRAMUSCULAR | Status: DC | PRN
  Administered 2023-11-24 (×2): 100 ug via INTRAVENOUS
  Filled 2023-11-23 (×2): qty 2

## 2023-11-23 MED ORDER — POLYETHYLENE GLYCOL 3350 17 G PO PACK: 17.0000 g | PACK | Freq: Every day | ORAL | Status: DC | PRN

## 2023-11-23 MED ORDER — LEVETIRACETAM IN NACL 1500 MG/100ML IV SOLN
3000.0000 mg | Freq: Once | INTRAVENOUS | Status: AC
  Administered 2023-11-23: 3000 mg via INTRAVENOUS
  Filled 2023-11-23: qty 200

## 2023-11-23 MED ORDER — SODIUM CHLORIDE 0.9 % IV SOLN: INTRAVENOUS | Status: AC

## 2023-11-23 MED ORDER — LORAZEPAM 2 MG/ML IJ SOLN
4.0000 mg | Freq: Once | INTRAMUSCULAR | Status: AC
  Administered 2023-11-23: 4 mg via INTRAVENOUS

## 2023-11-23 MED ORDER — FENTANYL CITRATE PF 50 MCG/ML IJ SOSY: 50.0000 ug | PREFILLED_SYRINGE | INTRAMUSCULAR | Status: DC | PRN

## 2023-11-23 MED ORDER — HEPARIN SODIUM (PORCINE) 5000 UNIT/ML IJ SOLN
5000.0000 [IU] | Freq: Three times a day (TID) | INTRAMUSCULAR | Status: DC
  Administered 2023-11-23 – 2023-11-29 (×19): 5000 [IU] via SUBCUTANEOUS
  Filled 2023-11-23 (×19): qty 1

## 2023-11-23 MED ORDER — FENTANYL BOLUS VIA INFUSION
50.0000 ug | INTRAVENOUS | Status: DC | PRN
  Administered 2023-11-23 – 2023-11-24 (×2): 100 ug via INTRAVENOUS

## 2023-11-23 MED ORDER — ORAL CARE MOUTH RINSE
15.0000 mL | OROMUCOSAL | Status: DC
  Administered 2023-11-23 – 2023-11-25 (×10): 15 mL via OROMUCOSAL

## 2023-11-23 MED ORDER — THIAMINE HCL 100 MG/ML IJ SOLN
500.0000 mg | Freq: Three times a day (TID) | INTRAVENOUS | Status: DC
  Administered 2023-11-23 – 2023-11-24 (×4): 500 mg via INTRAVENOUS
  Filled 2023-11-23 (×11): qty 5

## 2023-11-23 MED ORDER — CHLORHEXIDINE GLUCONATE CLOTH 2 % EX PADS
6.0000 | MEDICATED_PAD | Freq: Every day | CUTANEOUS | Status: DC
  Administered 2023-11-23 – 2023-11-29 (×7): 6 via TOPICAL

## 2023-11-23 MED ORDER — SODIUM CHLORIDE 0.9% FLUSH: 3.0000 mL | INTRAVENOUS | Status: DC | PRN

## 2023-11-23 MED ORDER — SODIUM CHLORIDE 0.9 % IV SOLN
1.0000 g | Freq: Once | INTRAVENOUS | Status: AC
  Administered 2023-11-23: 1 g via INTRAVENOUS
  Filled 2023-11-23: qty 10

## 2023-11-23 MED ORDER — LORAZEPAM 2 MG/ML IJ SOLN
INTRAMUSCULAR | Status: AC
  Filled 2023-11-23: qty 2

## 2023-11-23 MED ORDER — LEVETIRACETAM IN NACL 1500 MG/100ML IV SOLN
1500.0000 mg | Freq: Once | INTRAVENOUS | Status: DC
  Filled 2023-11-23: qty 100

## 2023-11-23 MED ORDER — ACETAMINOPHEN 325 MG PO TABS
650.0000 mg | ORAL_TABLET | ORAL | Status: DC | PRN
  Administered 2023-11-24 – 2023-11-28 (×4): 650 mg via ORAL
  Filled 2023-11-23 (×6): qty 2

## 2023-11-23 MED ORDER — LACTATED RINGERS IV BOLUS (SEPSIS)
1000.0000 mL | Freq: Once | INTRAVENOUS | Status: AC
  Administered 2023-11-23: 1000 mL via INTRAVENOUS

## 2023-11-23 MED ORDER — MAGNESIUM SULFATE 4 GM/100ML IV SOLN
4.0000 g | Freq: Once | INTRAVENOUS | Status: AC
  Administered 2023-11-23: 4 g via INTRAVENOUS

## 2023-11-23 MED ORDER — DEXMEDETOMIDINE HCL IN NACL 400 MCG/100ML IV SOLN
0.0000 ug/kg/h | INTRAVENOUS | Status: DC
  Administered 2023-11-23: 0.4 ug/kg/h via INTRAVENOUS
  Filled 2023-11-23: qty 100

## 2023-11-23 MED ORDER — LEVETIRACETAM IN NACL 500 MG/100ML IV SOLN
500.0000 mg | Freq: Once | INTRAVENOUS | Status: AC
  Administered 2023-11-23: 500 mg via INTRAVENOUS

## 2023-11-23 MED ORDER — SODIUM CHLORIDE 0.9% FLUSH
3.0000 mL | Freq: Two times a day (BID) | INTRAVENOUS | Status: DC
  Administered 2023-11-23 – 2023-11-24 (×2): 3 mL via INTRAVENOUS
  Administered 2023-11-24: 10 mL via INTRAVENOUS
  Administered 2023-11-25: 3 mL via INTRAVENOUS
  Administered 2023-11-25: 10 mL via INTRAVENOUS
  Administered 2023-11-26: 3 mL via INTRAVENOUS
  Administered 2023-11-26 – 2023-11-29 (×6): 10 mL via INTRAVENOUS

## 2023-11-23 MED ORDER — PROPOFOL 1000 MG/100ML IV EMUL
INTRAVENOUS | Status: AC
  Administered 2023-11-23: 10 ug/kg/min via INTRAVENOUS
  Filled 2023-11-23: qty 100

## 2023-11-23 NOTE — Progress Notes (Signed)
 Pt going to CT

## 2023-11-23 NOTE — Consult Note (Signed)
 NEUROLOGY CONSULT NOTE   Date of service: November 23, 2023 Patient Name: Alexandra Dorsey MRN:  [id number] DOB:  [birth year] Chief Complaint: seizures Requesting Provider: Randol Simmonds, MD  History of Present Illness  Alexandra Dorsey is a 48 y.o. female with hx of EtOh use, EtOh withdrawal seizures, gout, HTN, who was brought in to the ED unresponsive by EMS.  Was seen by EMS yestrday, was trying to detox from EtOH and declined to come to the hospital. She has not had EtOH in 3 days. Had a seizure at home, 2 more enroute. She was given 7.5mg  of Versed. She was obtunded and receiving bag valve mask ventilation. She was intubated in the ED and started on precedex .  Neurology consulted for evaluation.  CT Head is pending. Labs with leukocytosis, lactate of 4.5. CXR concerning for pneumonia. Uds positive for benzos.  She is unable to provide any history secondary to intubation and sedation.  On my evaluation, she seems to be having a seizure with BL arm flexion and increased tone with clonic movements and BL lower extremities extension with increased tone with eyes open. Episode resolved spontaneously prior to getting ativan .  ROS  Unable to ascertain due to intubation and sedation.  Past History   Past Medical History:  Diagnosis Date   Alcohol abuse    Anxiety    Gout    Hypertension     History reviewed. No pertinent surgical history.  Family History: Family History  Problem Relation Age of Onset   Hypertension Mother    Hypertension Father    Stroke Maternal Grandfather     Social History  reports that she has quit smoking. She has never used smokeless tobacco. She reports current alcohol use of about 10.0 standard drinks of alcohol per week. She reports that she does not use drugs.  Allergies  Allergen Reactions   Lisinopril  Cough   Norvasc  [Amlodipine  Besylate] Other (See Comments)    Feet hurts    Medications   Current Facility-Administered Medications:     azithromycin  (ZITHROMAX ) 500 mg in sodium chloride  0.9 % 250 mL IVPB, 500 mg, Intravenous, Once, Randol Simmonds, MD   cefTRIAXone  (ROCEPHIN ) 1 g in sodium chloride  0.9 % 100 mL IVPB, 1 g, Intravenous, Once, Randol Simmonds, MD   dexmedetomidine  (PRECEDEX ) 400 MCG/100ML (4 mcg/mL) infusion, 0-1.2 mcg/kg/hr, Intravenous, Continuous, Randol Simmonds, MD, Last Rate: 5.92 mL/hr at 11/23/23 1351, 0.4 mcg/kg/hr at 11/23/23 1351   fentaNYL  (SUBLIMAZE ) bolus via infusion 50-100 mcg, 50-100 mcg, Intravenous, Q15 min PRN, Randol Simmonds, MD   fentaNYL  (SUBLIMAZE ) injection 50 mcg, 50 mcg, Intravenous, Q15 min PRN, Randol Simmonds, MD   fentaNYL  (SUBLIMAZE ) injection 50-200 mcg, 50-200 mcg, Intravenous, Q30 min PRN, Randol Simmonds, MD   fentaNYL  in NS (30mcg/ml) infusion-PREMIX, 50-200 mcg/hr, Intravenous, Continuous, Randol Simmonds, MD, Last Rate: 5 mL/hr at 11/23/23 1351, 50 mcg/hr at 11/23/23 1351   lactated ringers  bolus 1,000 mL, 1,000 mL, Intravenous, Once, Randol Simmonds, MD, Last Rate: 999 mL/hr at 11/23/23 1433, 1,000 mL at 11/23/23 1433   lactated ringers  bolus 1,000 mL, 1,000 mL, Intravenous, Once, Randol Simmonds, MD  Current Outpatient Medications:    acetaminophen  (TYLENOL ) 325 MG tablet, Take 2 tablets (650 mg total) by mouth every 6 (six) hours as needed for mild pain (or Fever >/= 101). (Patient not taking: Reported on 11/15/2023), Disp: , Rfl:    acetaminophen  (TYLENOL ) 325 MG tablet, Take 2 tablets (650 mg total) by mouth every 6 (six) hours as needed  for mild pain (pain score 1-3) (or Fever >/= 101)., Disp: , Rfl:    albuterol  (VENTOLIN  HFA) 108 (90 Base) MCG/ACT inhaler, 1 puff as needed Inhalation every 4 hrs for 30 days, Disp: , Rfl:    allopurinol  (ZYLOPRIM ) 300 MG tablet, TAKE 1 TABLET BY MOUTH DAILY Orally Once a day, Disp: , Rfl:    amLODipine  (NORVASC ) 10 MG tablet, Take 10 mg by mouth daily., Disp: , Rfl:    clonazePAM (KLONOPIN) 1 MG tablet, Take 1 mg by mouth 2 (two) times daily as needed for anxiety.,  Disp: , Rfl:    escitalopram  (LEXAPRO ) 20 MG tablet, Take 20 mg by mouth daily., Disp: , Rfl:    folic acid  (FOLVITE ) 1 MG tablet, Take 1 tablet (1 mg total) by mouth daily., Disp: , Rfl:    levothyroxine  (SYNTHROID ) 25 MCG tablet, 1 tablet in the morning on an empty stomach Orally Once a day, Disp: , Rfl:    omeprazole (PRILOSEC) 40 MG capsule, 1 capsule 30 minutes before morning meal Orally Once a day, Disp: , Rfl:    ondansetron  (ZOFRAN -ODT) 4 MG disintegrating tablet, Take 1 tablet (4 mg total) by mouth every 8 (eight) hours as needed for nausea or vomiting. (Patient not taking: Reported on 03/21/2022), Disp: 10 tablet, Rfl: 0   potassium chloride  SA (KLOR-CON  M) 20 MEQ tablet, Take 2 tablets (40 mEq total) by mouth daily., Disp: 6 tablet, Rfl: 0   QUEtiapine  (SEROQUEL ) 200 MG tablet, Take 200 mg by mouth at bedtime., Disp: , Rfl:    traZODone  (DESYREL ) 50 MG tablet, Take 50 mg by mouth at bedtime., Disp: , Rfl:   Vitals   Vitals:   11/23/23 1356 11/23/23 1400 11/23/23 1405 11/23/23 1425  BP: (!) 174/133 (!) 134/99 (!) 119/92 (!) 124/97  Pulse: (!) 125 (!) 108 100 93  Resp: (!) 32 (!) 21 20 20   Temp: 97.6 F (36.4 C) (!) 97.2 F (36.2 C) (!) 96.9 F (36.1 C) (!) 96.7 F (35.9 C)  TempSrc: Bladder   Bladder  SpO2: 100% 100% 100% 100%  Weight:  60 kg    Height:  5' (1.524 m)      Body mass index is 25.83 kg/m.  Physical Exam   General: Laying comfortably in bed; in no acute distress.  HENT: Normal oropharynx and mucosa. Normal external appearance of ears and nose.  Neck: Supple, no pain or tenderness  CV: No JVD. No peripheral edema.  Pulmonary: Symmetric Chest rise. Not breathing over vent. Abdomen: Soft to touch, non-tender.  Ext: No cyanosis, edema, or deformity  Skin: No rash. Normal palpation of skin.   Musculoskeletal: Normal digits and nails by inspection. No clubbing.   Neurologic Examination immediately after ativan  for noted GTC seizure  Mental status/Cognition:  eyes open, no response to voice or loud clap Speech/language: intubated, no speech, no attempts to communicate, does not follow commands. Cranial nerves:   CN II Pupils 3mm BL, very little reaction to bright light   CN III,IV,VI Dolls eye reflex is absent.   CN V Corneals absent BL   CN VII No facial grimace to noxious stimuli   CN VIII Does not turn head towards speech   CN IX & X Cough intact, no gag.   CN XI Head is midline   CN XII midline tongue but does not protrude on command.   Sensory/Motor:  Muscle bulk: normal, tone flaccid in all extremities. Other than earlier noted GTC seizure, no spontaneous or purposeful  movement noted to noxious stimuli.  Coordination/Complex Motor:  Unable to assess.  Labs/Imaging/Neurodiagnostic studies   CBC:  Recent Labs  Lab Dec 06, 2023 0356 11/23/23 1356 11/23/23 1406  WBC 4.6 14.0*  --   NEUTROABS  --  11.2*  --   HGB 11.3* 11.5* 11.6*  HCT 34.1* 34.1* 34.0*  MCV 105.9* 104.0*  --   PLT 168 240  --    Basic Metabolic Panel:  Lab Results  Component Value Date   NA 134 (L) 11/23/2023   K 3.6 11/23/2023   CO2 22 11/23/2023   GLUCOSE 120 (H) 11/23/2023   BUN 21 (H) 11/23/2023   CREATININE 2.23 (H) 11/23/2023   CALCIUM  8.6 (L) 11/23/2023   GFRNONAA 27 (L) 11/23/2023   GFRAA 134 03/29/2018   Lipid Panel:  Lab Results  Component Value Date   LDLCALC 97 08/06/2016   HgbA1c: No results found for: HGBA1C Urine Drug Screen:     Component Value Date/Time   LABOPIA NONE DETECTED 11/23/2023 1357   COCAINSCRNUR NONE DETECTED 11/23/2023 1357   LABBENZ POSITIVE (A) 11/23/2023 1357   AMPHETMU NONE DETECTED 11/23/2023 1357   THCU NONE DETECTED 11/23/2023 1357   LABBARB NONE DETECTED 11/23/2023 1357    Alcohol Level     Component Value Date/Time   ETH <10 11/14/2023 1807   INR  Lab Results  Component Value Date   INR 0.9 11/23/2023   APTT  Lab Results  Component Value Date   APTT 25 11/23/2023   AED levels: No results  found for: PHENYTOIN, ZONISAMIDE, LAMOTRIGINE, LEVETIRACETA  CT Head without contrast: pending  MRI Brain: pending  Neurodiagnostics cEEG:  pending  ASSESSMENT   Alexandra Dorsey is a 48 y.o. female with hx of EtOh use, EtOh withdrawal seizures, gout, HTN, who was brought in to the ED unresponsive by EMS. Has had 3 seizure today and intubated now. On my evaluation, noted to have a 1 min GTC seizure with arms drawn closer to chest, eyes open and starring off, increased tone in BL legs. Seizure resolved spontaneously prior to getting ativan . Exam post seizure and ativan  4mg  with obtunded mentation and no brainstem reflexes.  Suspect EtOh withdrawal seizures, dont think she will need longterm AEDs.  RECOMMENDATIONS  - CT Head w/o contrast - Ativan  4mg  IV once. - keppra  60mg /Kg IV once - Predecex while maybe helpful for EtOH withdrawal, has very little utility in seizure control. Discussed with Dr. Agarwala, will switch over to Propofol . - Further AEDs based on cEEG. - STAT cEEG with MRI compatible leads. - routine MRI Brain w/o contrast. ______________________________________________________________________  This patient is critically ill and at significant risk of neurological worsening, death and care requires constant monitoring of vital signs, hemodynamics,respiratory and cardiac monitoring, neurological assessment, discussion with family, other specialists and medical decision making of high complexity. I spent 40 minutes of neurocritical care time  in the care of  this patient. This was time spent independent of any time provided by nurse practitioner or PA.  Edilson Vital [facility] 11/23/2023  4:00 PM  Signed, Angeliki Mates, MD [facility]

## 2023-11-23 NOTE — H&P (Signed)
 NAME:  Alexandra Dorsey, MRN:  [id number], DOB:  10-03-1976, LOS: 0 ADMISSION DATE:  11/23/2023, CONSULTATION DATE:  11/23/23  REFERRING MD: MD Randol CHIEF COMPLAINT: AMS, Obtunded   History of Present Illness:  Pt is 48 yr female with ETOH abuse, anxiety, gout, HTN, hypothyroidism, ETOH abuse, accidental drug overdose, chronic prolonged QTC and recent hospitalization from 1/28 to 11/17/23 due to electrolyte abnormalities secondary to alcoholism presents to the ED with AMS. Per EMS, patient was attempting to self detox off alcohol for approximately 2 full days and had refused transporting to the hospital on 11/22/23. Patient was transported to the ED on 2/6 with AMS and multiple seizures in route. Patient was given 7.5 mg of IM versed during transport. Upon arrival to ED patient was receiving BVM and required intubation due inability to protect airway. PCCM consulted to admit and further manage patient. Neuro also consulted. HPI obtained from EMR.   Pertinent  Medical History  HTN Hypothyroidism  ETOH abuse  Accidental Drug Overdose  Anxiety  Gout   Significant Hospital Events: Including procedures, antibiotic start and stop dates in addition to other pertinent events   Admit under PCCM for Status Epilepticus   Interim History / Subjective:  Intubated, on precedex   Currently seizing   Objective   Blood pressure (!) 124/97, pulse 93, temperature (!) 96.7 F (35.9 C), temperature source Bladder, resp. rate 20, height 5' (1.524 m), weight 60 kg, SpO2 100%.    Vent Mode: PRVC FiO2 (%):  [40 %-100 %] 40 % Set Rate:  [20 bmp] 20 bmp Vt Set:  [370 mL] 370 mL PEEP:  [5 cmH20] 5 cmH20 Plateau Pressure:  [20 cmH20] 20 cmH20   Intake/Output Summary (Last 24 hours) at 11/23/2023 1523 Last data filed at 11/23/2023 1429 Gross per 24 hour  Intake --  Output 350 ml  Net -350 ml   Filed Weights   11/23/23 1400  Weight: 60 kg    Examination: General: acutely ill adult female, intubated, acutely  seizing  HEENT: Normocephalic, left eye deviation, pupils not reactive, nystagmus , poor dentition, pink MM Lungs: Expiratory wheezes, diminished in lower bases, on vent  Cardiovascular: s1,s2, RRR, no MRG, No JVD Abdomen: BS soft, OG-with brown emesis Extremities:  Neuro: pupils not reactive, nystagmus, tonic clonic seizure activity  GU: Foley   Resolved Hospital Problem list   N/a   Assessment & Plan:  Status Epilepticus  Lactic acidosis Presumed secondary to ETOH self detox  Hx of ETOH- Etoh < 10  Patient actively seizing, Neuro at beside-4mg  of Ativan  IV ordered and given  UDS positive for benzos, received in route to hospital  Lactic acid consistent with seizure activity  P:  Loading and ordering Keppra  per Neuro, continue seizure precautions  Continuous monitoring of Airway and vital signs via telemetry  Continue to check BMETs daily, STAT Mag, Phos along with trending daily  PRN ativan  for break through seizures  Neuro protective measures: euglycemia, normothermia, normocapnia, optimize electrolytes  Continuous LTM with neurochecks CT scan STAT, results pending CBG q 4  Change precedex  to propofol , continue low dose fentanyl  infusion  Add High dose Thiamine  500 mg IV q 8 x 5 days  Add Folic acid  and multivitamin   Respiratory Insufficiency Intubated for airway protection P:  Continue ventilator support and lung protective strategies  Continue LTVV 6-8cc/kg  Wean PEEP and Fio2 requirements to sat goal of >92%  HOB > 30 degrees Plat pressures < 30  Intermittent Chest X-ray and  ABGS VAP and PAD protocols in place  Hold of WUA and SBT at this time, until SE appropriately controlled  Albuterol  PRN with duonebs    Acute Kidney Injury  In setting of above  Cr 2.23 on 2/6  P: Continue to trend renal function daily  Continue to monitor and optimize electrolytes daily Continue to monitor urine output Continue strict I/Os Continue Adequate renal perfusion  Avoid  nephrotoxic agents  Start N.S at 26ml/hr   Chronic Prolonged QTC  EKG QTc 484 P: Avoid qtc prolonging drugs  Repeat EKG in AM on 2/7   HTN  P: Restart amlodipine  on 2/7 if BP holds   Hypothyroidism  P: Restart levothyroxine  when appropriate   Gout  P: Hold allopurinol  for now   Anxiety  P: Hold lexapro  and seroquel  for now    [employer] (right click and Reselect all SmartList Selections daily)   Diet/type: NPO DVT prophylaxis prophylactic heparin   Pressure ulcer(s): N/A GI prophylaxis: PPI Lines: N/A Foley:  foley  Code Status:  full code Last date of multidisciplinary goals of care discussion [family not at beside]   Labs   CBC: Recent Labs  Lab 11/17/23 0356 11/23/23 1356 11/23/23 1406  WBC 4.6 14.0*  --   NEUTROABS  --  11.2*  --   HGB 11.3* 11.5* 11.6*  HCT 34.1* 34.1* 34.0*  MCV 105.9* 104.0*  --   PLT 168 240  --     Basic Metabolic Panel: Recent Labs  Lab 11/17/23 0356 11/23/23 1356 11/23/23 1406  NA 133* 136 134*  K 4.2 3.6 3.6  CL 102 95*  --   CO2 23 22  --   GLUCOSE 81 120*  --   BUN 7 21*  --   CREATININE 0.42* 2.23*  --   CALCIUM  7.1* 8.6*  --    GFR: Estimated Creatinine Clearance: 25.5 mL/min (A) (by C-G formula based on SCr of 2.23 mg/dL (H)). Recent Labs  Lab 11/17/23 0356 11/23/23 1356 11/23/23 1420  WBC 4.6 14.0*  --   LATICACIDVEN  --   --  4.5*    Liver Function Tests: Recent Labs  Lab 11/17/23 0356 11/23/23 1356  AST 34 29  ALT 11 15  ALKPHOS 55 58  BILITOT 0.3 0.3  PROT 5.3* 6.3*  ALBUMIN 2.9* 3.3*   No results for input(s): LIPASE, AMYLASE in the last 168 hours. No results for input(s): AMMONIA in the last 168 hours.  ABG    Component Value Date/Time   PHART 7.314 (L) 11/23/2023 1406   PCO2ART 50.2 (H) 11/23/2023 1406   PO2ART 356 (H) 11/23/2023 1406   HCO3 25.7 11/23/2023 1406   TCO2 27 11/23/2023 1406   ACIDBASEDEF 1.0 11/23/2023 1406   O2SAT 100 11/23/2023 1406      Coagulation Profile: Recent Labs  Lab 11/23/23 1356  INR 0.9    Cardiac Enzymes: No results for input(s): CKTOTAL, CKMB, CKMBINDEX, TROPONINI in the last 168 hours.  HbA1C: No results found for: HGBA1C  CBG: No results for input(s): GLUCAP in the last 168 hours.  Review of Systems:   Patient intubated, sedated   Past Medical History:  She,  has a past medical history of Alcohol abuse, Anxiety, Gout, and Hypertension.   Surgical History:  History reviewed. No pertinent surgical history.   Social History:   reports that she has quit smoking. She has never used smokeless tobacco. She reports current alcohol use of about 10.0 standard drinks of alcohol per week.  She reports that she does not use drugs.   Family History:  Her family history includes Hypertension in her father and mother; Stroke in her maternal grandfather.   Allergies Allergies  Allergen Reactions   Lisinopril  Cough   Norvasc  [Amlodipine  Besylate] Other (See Comments)    Feet hurts     Home Medications  Prior to Admission medications   Medication Sig Start Date End Date Taking? Authorizing Provider  acetaminophen  (TYLENOL ) 325 MG tablet Take 2 tablets (650 mg total) by mouth every 6 (six) hours as needed for mild pain (or Fever >/= 101). Patient not taking: Reported on 11/15/2023 04/27/21   Sebastian Toribio GAILS, MD  acetaminophen  (TYLENOL ) 325 MG tablet Take 2 tablets (650 mg total) by mouth every 6 (six) hours as needed for mild pain (pain score 1-3) (or Fever >/= 101). 11/17/23   Samtani, Jai-Gurmukh, MD  albuterol  (VENTOLIN  HFA) 108 (90 Base) MCG/ACT inhaler 1 puff as needed Inhalation every 4 hrs for 30 days 06/28/23   [provider]  allopurinol  (ZYLOPRIM ) 300 MG tablet TAKE 1 TABLET BY MOUTH DAILY Orally Once a day    [provider]  amLODipine  (NORVASC ) 10 MG tablet Take 10 mg by mouth daily.    [provider]  clonazePAM (KLONOPIN) 1 MG tablet Take 1 mg by  mouth 2 (two) times daily as needed for anxiety.    [provider]  escitalopram  (LEXAPRO ) 20 MG tablet Take 20 mg by mouth daily. 08/13/23   [provider]  folic acid  (FOLVITE ) 1 MG tablet Take 1 tablet (1 mg total) by mouth daily. 04/28/21   Sebastian Toribio GAILS, MD  levothyroxine  (SYNTHROID ) 25 MCG tablet 1 tablet in the morning on an empty stomach Orally Once a day 01/04/23   [provider]  omeprazole (PRILOSEC) 40 MG capsule 1 capsule 30 minutes before morning meal Orally Once a day 03/28/23   [provider]  ondansetron  (ZOFRAN -ODT) 4 MG disintegrating tablet Take 1 tablet (4 mg total) by mouth every 8 (eight) hours as needed for nausea or vomiting. Patient not taking: Reported on 03/21/2022 11/30/21   Freddi Hamilton, MD  potassium chloride  SA (KLOR-CON  M) 20 MEQ tablet Take 2 tablets (40 mEq total) by mouth daily. 11/17/23   Samtani, Jai-Gurmukh, MD  QUEtiapine  (SEROQUEL ) 200 MG tablet Take 200 mg by mouth at bedtime.    [provider]  traZODone  (DESYREL ) 50 MG tablet Take 50 mg by mouth at bedtime. 12/20/21   [provider]     Critical care time: 60 mins   Christian Jolanda Mccann AGACNP-BC   [facility] 11/23/2023, 5:01 PM  Please see [email or web address] for pager details.  From 7A-7P if no response, please call [phone]. After hours, please call ELink [phone].

## 2023-11-23 NOTE — Progress Notes (Signed)
 Ventilator patient transported from Trauma C to CT3 and back without any issues.

## 2023-11-23 NOTE — ED Notes (Signed)
 X-ray at bedside

## 2023-11-23 NOTE — ED Provider Notes (Signed)
 [place] EMERGENCY DEPARTMENT AT [facility] Provider Note   CSN: [id number] Arrival date & time: 11/23/23  1327     History  Chief Complaint  Patient presents with   Seizures    Alexandra Dorsey is a 48 y.o. female.   Seizures    Patient has history of hypertension alcoholism, anxiety, accidental drug overdose who presents to the ED for evaluation of altered mental status.  According to the EMS report they saw her yesterday.  Patient indicated she was trying to detox off alcohol.  She refused to transport to the hospital at that time.  Reportedly this is the third day without drinking any alcohol.  Patient reportedly had 1 seizure before arrival and then 2 more witnessed seizures en route to the ED.  Patient has not communicated with EMS since they saw her.  On arrival to the ED she was receiving bag-valve-mask ventilation.  Patient was given 7.5 mg of midazolam IM.  Home Medications Prior to Admission medications   Medication Sig Start Date End Date Taking? Authorizing Provider  acetaminophen  (TYLENOL ) 325 MG tablet Take 2 tablets (650 mg total) by mouth every 6 (six) hours as needed for mild pain (or Fever >/= 101). Patient not taking: Reported on 11/15/2023 04/27/21   Sebastian Toribio GAILS, MD  acetaminophen  (TYLENOL ) 325 MG tablet Take 2 tablets (650 mg total) by mouth every 6 (six) hours as needed for mild pain (pain score 1-3) (or Fever >/= 101). 11/17/23   Samtani, Jai-Gurmukh, MD  albuterol  (VENTOLIN  HFA) 108 (90 Base) MCG/ACT inhaler 1 puff as needed Inhalation every 4 hrs for 30 days 06/28/23   [provider]  allopurinol  (ZYLOPRIM ) 300 MG tablet TAKE 1 TABLET BY MOUTH DAILY Orally Once a day    [provider]  amLODipine  (NORVASC ) 10 MG tablet Take 10 mg by mouth daily.    [provider]  clonazePAM (KLONOPIN) 1 MG tablet Take 1 mg by mouth 2 (two) times daily as needed for anxiety.    [provider]  escitalopram  (LEXAPRO ) 20  MG tablet Take 20 mg by mouth daily. 08/13/23   [provider]  folic acid  (FOLVITE ) 1 MG tablet Take 1 tablet (1 mg total) by mouth daily. 04/28/21   Sebastian Toribio GAILS, MD  levothyroxine  (SYNTHROID ) 25 MCG tablet 1 tablet in the morning on an empty stomach Orally Once a day 01/04/23   [provider]  omeprazole (PRILOSEC) 40 MG capsule 1 capsule 30 minutes before morning meal Orally Once a day 03/28/23   [provider]  ondansetron  (ZOFRAN -ODT) 4 MG disintegrating tablet Take 1 tablet (4 mg total) by mouth every 8 (eight) hours as needed for nausea or vomiting. Patient not taking: Reported on 03/21/2022 11/30/21   Freddi Hamilton, MD  potassium chloride  SA (KLOR-CON  M) 20 MEQ tablet Take 2 tablets (40 mEq total) by mouth daily. 11/17/23   Samtani, Jai-Gurmukh, MD  QUEtiapine  (SEROQUEL ) 200 MG tablet Take 200 mg by mouth at bedtime.    [provider]  traZODone  (DESYREL ) 50 MG tablet Take 50 mg by mouth at bedtime. 12/20/21   [provider]      Allergies    Lisinopril  and Norvasc  [amlodipine  besylate]    Review of Systems   Review of Systems  Neurological:  Positive for seizures.    Physical Exam Updated Vital Signs BP (!) 124/97   Pulse 93   Temp (!) 96.7 F (35.9 C) (Bladder)   Resp 20  Ht 1.524 m (5')   Wt 60 kg   SpO2 100%   BMI 25.83 kg/m  Physical Exam Vitals and nursing note reviewed.  Constitutional:      General: She is in acute distress.     Appearance: She is well-developed. She is ill-appearing.  HENT:     Head: Normocephalic and atraumatic.     Right Ear: External ear normal.     Left Ear: External ear normal.  Eyes:     General: No scleral icterus.       Right eye: No discharge.        Left eye: No discharge.     Conjunctiva/sclera: Conjunctivae normal.  Neck:     Trachea: No tracheal deviation.  Cardiovascular:     Rate and Rhythm: Normal rate and regular rhythm.  Pulmonary:     Effort: Pulmonary effort is  normal. No respiratory distress.     Breath sounds: Normal breath sounds. No stridor. No wheezing or rales.  Abdominal:     General: Bowel sounds are normal. There is no distension.     Palpations: Abdomen is soft.     Tenderness: There is no abdominal tenderness.     Comments: Bruising noted along the anterior abdomen  Musculoskeletal:        General: No deformity.     Cervical back: Neck supple.     Right lower leg: No edema.     Left lower leg: No edema.  Skin:    General: Skin is warm and dry.     Findings: No rash.  Neurological:     GCS: GCS eye subscore is 1. GCS verbal subscore is 1. GCS motor subscore is 1.     Comments: Patient unresponsive on arrival     ED Results / Procedures / Treatments   Labs (all labs ordered are listed, but only abnormal results are displayed) Labs Reviewed  COMPREHENSIVE METABOLIC PANEL - Abnormal; Notable for the following components:      Result Value   Chloride 95 (*)    Glucose, Bld 120 (*)    BUN 21 (*)    Creatinine, Ser 2.23 (*)    Calcium  8.6 (*)    Total Protein 6.3 (*)    Albumin 3.3 (*)    GFR, Estimated 27 (*)    Anion gap 19 (*)    All other components within normal limits  CBC WITH DIFFERENTIAL/PLATELET - Abnormal; Notable for the following components:   WBC 14.0 (*)    RBC 3.28 (*)    Hemoglobin 11.5 (*)    HCT 34.1 (*)    MCV 104.0 (*)    MCH 35.1 (*)    Neutro Abs 11.2 (*)    Monocytes Absolute 1.5 (*)    Abs Immature Granulocytes 0.12 (*)    All other components within normal limits  RAPID URINE DRUG SCREEN, HOSP PERFORMED - Abnormal; Notable for the following components:   Benzodiazepines POSITIVE (*)    All other components within normal limits  I-STAT CG4 LACTIC ACID, ED - Abnormal; Notable for the following components:   Lactic Acid, Venous 4.5 (*)    All other components within normal limits  I-STAT ARTERIAL BLOOD GAS, ED - Abnormal; Notable for the following components:   pH, Arterial 7.314 (*)    pCO2  arterial 50.2 (*)    pO2, Arterial 356 (*)    Sodium 134 (*)    Calcium , Ion 1.12 (*)    HCT 34.0 (*)  Hemoglobin 11.6 (*)    All other components within normal limits  CULTURE, BLOOD (ROUTINE X 2)  CULTURE, BLOOD (ROUTINE X 2)  RESP PANEL BY RT-PCR (RSV, FLU A&B, COVID)  RVPGX2  PROTIME-INR  APTT  HCG, SERUM, QUALITATIVE  URINALYSIS, W/ REFLEX TO CULTURE (INFECTION SUSPECTED)  AMMONIA  ACETAMINOPHEN  LEVEL  SALICYLATE LEVEL  ETHANOL  I-STAT CG4 LACTIC ACID, ED    EKG EKG Interpretation Date/Time:  Thursday November 23 2023 14:10:58 EST Ventricular Rate:  98 PR Interval:  158 QRS Duration:  87 QT Interval:  379 QTC Calculation: 484 R Axis:   94  Text Interpretation: Sinus rhythm Anteroseptal infarct, age indeterminate Confirmed by Randol Simmonds ([id number]) on 11/23/2023 3:07:22 PM  Radiology DG Chest Port 1 View Result Date: 11/23/2023 CLINICAL DATA:  Seizure status post intubation EXAM: PORTABLE CHEST 1 VIEW COMPARISON:  Chest radiograph dated 10/14/2023 FINDINGS: Lines/tubes: Endotracheal tube tip projects 3.4 cm above the carina. Enteric tube tip reaches the diaphragm and terminates below the field of view. Lungs: Mildly low lung volumes.  Dense left retrocardiac opacity. Pleura: Questionable blunting of the left costophrenic angle. No pneumothorax. Heart/mediastinum: The heart size and mediastinal contours are within normal limits. Bones: No acute osseous abnormality. IMPRESSION: 1. Endotracheal tube tip projects 3.4 cm above the carina. 2. Dense left retrocardiac opacity, likely atelectasis. Aspiration or pneumonia can be considered in the appropriate clinical setting. 3. Questionable blunting of the left costophrenic angle, which may represent a small pleural effusion. Electronically Signed   By: Limin  Xu M.D.   On: 11/23/2023 14:43    Procedures .Critical Care  Performed by: Randol Simmonds, MD Authorized by: Randol Simmonds, MD   Critical care provider statement:    Critical care  time (minutes):  30   Critical care was time spent personally by me on the following activities:  Development of treatment plan with patient or surrogate, discussions with consultants, evaluation of patient's response to treatment, examination of patient, ordering and review of laboratory studies, ordering and review of radiographic studies, ordering and performing treatments and interventions, pulse oximetry, re-evaluation of patient's condition and review of old charts Procedure Name: Intubation Date/Time: 11/23/2023 2:00 PM  Performed by: Randol Simmonds, MDPre-anesthesia Checklist: Patient identified, Patient being monitored, Emergency Drugs available, Timeout performed and Suction available Oxygen Delivery Method: Non-rebreather mask Preoxygenation: Pre-oxygenation with 100% oxygen Induction Type: Rapid sequence Ventilation: Mask ventilation without difficulty Laryngoscope Size: Glidescope Grade View: Grade I Tube size: 7.5 mm Number of attempts: 1 Airway Equipment and Method: Video-laryngoscopy Placement Confirmation: ETT inserted through vocal cords under direct vision, CO2 detector and Breath sounds checked- equal and bilateral Secured at: 21 cm Tube secured with: Tape Dental Injury: Teeth and Oropharynx as per pre-operative assessment         Medications Ordered in ED Medications  fentaNYL  (SUBLIMAZE ) injection 50 mcg (has no administration in time range)  fentaNYL  (SUBLIMAZE ) injection 50-200 mcg (has no administration in time range)  fentaNYL  in NS (26mcg/ml) infusion-PREMIX (50 mcg/hr Intravenous New Bag/Given 11/23/23 1351)  fentaNYL  (SUBLIMAZE ) bolus via infusion 50-100 mcg (has no administration in time range)  dexmedetomidine  (PRECEDEX ) 400 MCG/100ML (4 mcg/mL) infusion (0.4 mcg/kg/hr  59.2 kg Intravenous New Bag/Given 11/23/23 1351)  lactated ringers  bolus 1,000 mL (1,000 mLs Intravenous New Bag/Given 11/23/23 1433)  lactated ringers  bolus 1,000 mL (has no  administration in time range)  cefTRIAXone  (ROCEPHIN ) 1 g in sodium chloride  0.9 % 100 mL IVPB (has no administration in time range)  azithromycin  (ZITHROMAX )  500 mg in sodium chloride  0.9 % 250 mL IVPB (has no administration in time range)  fentaNYL  (SUBLIMAZE ) injection 50 mcg (50 mcg Intravenous Given 11/23/23 1354)    ED Course/ Medical Decision Making/ A&P Clinical Course as of 11/23/23 1527  Thu Nov 23, 2023  1410 ABG shows a component of respiratory acidosis [JK]  1457 CBC with Differential(!) Lactic acid elevated at 4.5.  No clear signs of infection at this time.  Is possible the lactic acidosis is related to her seizures earlier.  Blood cultures have been sent.  Blood pressure remains normal will continue to monitor [JK]  1458 CBC with Differential(!) Leukocytosis normal hemoglobin stable. [JK]  1509 Chest x-ray suggests atelectasis versus aspiration or pneumonia.  Will cover with antibiotics [JK]  1527 Case discussed with Dr. Merrianne regarding admission [JK]    Clinical Course User Index [JK] Randol Simmonds, MD                                 Medical Decision Making Problems Addressed: Alcohol withdrawal syndrome, with delirium [facility]): acute illness or injury that poses a threat to life or bodily functions Seizure [facility]): acute illness or injury that poses a threat to life or bodily functions  Amount and/or Complexity of Data Reviewed Labs: ordered. Decision-making details documented in ED Course. Radiology: ordered.  Risk Prescription drug management. Decision regarding hospitalization.   Patient presented to the ED for evaluation of altered mental status seizures in the setting of alcohol withdrawal.  Patient reportedly had been trying to detox herself from alcohol.  Patient had 3 witnessed seizures today to by EMS.  Patient arrived in the ED obtunded.  She was intubated for airway protection.  Patient notably tachycardic on arrival but that has improved.  Labs do show  leukocytosis although no clear signs of infection at this time.  Urinalysis has been ordered.  Chest x-ray does suggest the possibility of possible aspiration.  Will cover with antibiotics for pneumonia.  Lactic acid level elevated but suspect that is related to her seizures rather than sepsis at this time.  She has been started on Precedex  to help with her alcohol withdrawal.  No recurrent seizure activity noted at this time.  I have consulted with pulmonary critical care regarding admission.  I will contact neurology for consultation        Final Clinical Impression(s) / ED Diagnoses Final diagnoses:  Seizure (HCC)  Alcohol withdrawal syndrome, with delirium (HCC)  AKI (acute kidney injury) [facility])    Rx / DC Orders ED Discharge Orders     None         Randol Simmonds, MD 11/23/23 1527

## 2023-11-23 NOTE — ED Notes (Signed)
Bair hugger applied to pt.  

## 2023-11-23 NOTE — ED Notes (Signed)
 Pt began seizing while ICU providers at bedside & verbal order for 4 mg Ativan  gave & it was given.

## 2023-11-23 NOTE — ED Triage Notes (Signed)
 Pt BIB [facility] from home d/t self detoxing from ETOH. EMS reports they saw her yesterday & she was refusing to go to hospital, did not report how much she usually drinks or if seizures have happened in the past from detox attempts. EMS endorses this is the 3rd day with no ETOH & she had 1 seizure before their arrival then 2 more witnessed seizures while en route to ED. Did have a total of 7.5 Midazolam IM on the truck. Initial BP 99/64 then after 100cc NS in PIV it was 148/92. 20g Lt hand & 18g Rt hand PIV by EMS, NSR at 134 bpm, 12L unremarkable, resp of 4-14 with BVM depending on if seizing (per EMS).

## 2023-11-23 NOTE — ED Notes (Signed)
 EEG at bedside.

## 2023-11-23 NOTE — Plan of Care (Signed)
 LTM EEG reviewed by Dr. Merceda Stairs to 1630. No electrographic seizures seen.   Electronically signed: Dr. Blima Jaimes

## 2023-11-23 NOTE — Progress Notes (Signed)
 STAT LTM EEG complete. MRI compatible leads used. [facility] not monitoring until patient on the floor.

## 2023-11-23 NOTE — Progress Notes (Signed)
 Ventilator patient transported from ED Trauma C to 2M06 without any issues.

## 2023-11-23 NOTE — ED Notes (Signed)
 Transported to CT

## 2023-11-23 NOTE — ED Notes (Signed)
 Intubated at 1339, ET 7.5 & 22 at lip.

## 2023-11-24 ENCOUNTER — Encounter (HOSPITAL_COMMUNITY): Payer: Self-pay

## 2023-11-24 ENCOUNTER — Inpatient Hospital Stay (HOSPITAL_COMMUNITY): Payer: MEDICAID

## 2023-11-24 DIAGNOSIS — J9601 Acute respiratory failure with hypoxia: Secondary | ICD-10-CM

## 2023-11-24 DIAGNOSIS — R569 Unspecified convulsions: Secondary | ICD-10-CM | POA: Diagnosis not present

## 2023-11-24 DIAGNOSIS — F10931 Alcohol use, unspecified with withdrawal delirium: Secondary | ICD-10-CM | POA: Diagnosis not present

## 2023-11-24 DIAGNOSIS — N179 Acute kidney failure, unspecified: Secondary | ICD-10-CM | POA: Diagnosis not present

## 2023-11-24 LAB — BASIC METABOLIC PANEL
Anion gap: 11 (ref 5–15)
BUN: 13 mg/dL (ref 6–20)
CO2: 24 mmol/L (ref 22–32)
Calcium: 8.1 mg/dL — ABNORMAL LOW (ref 8.9–10.3)
Chloride: 104 mmol/L (ref 98–111)
Creatinine, Ser: 1.1 mg/dL — ABNORMAL HIGH (ref 0.44–1.00)
GFR, Estimated: >60 mL/min (ref >60)
Glucose, Bld: 93 mg/dL (ref 70–99)
Potassium: 3 mmol/L — ABNORMAL LOW (ref 3.5–5.1)
Sodium: 139 mmol/L (ref 135–145)

## 2023-11-24 LAB — CBC
HCT: 29.7 % — ABNORMAL LOW (ref 36.0–46.0)
Hemoglobin: 9.7 g/dL — ABNORMAL LOW (ref 12.0–15.0)
MCH: 34.6 pg — ABNORMAL HIGH (ref 26.0–34.0)
MCHC: 32.7 g/dL (ref 30.0–36.0)
MCV: 106.1 fL — ABNORMAL HIGH (ref 80.0–100.0)
Platelets: 219 10*3/uL (ref 150–400)
RBC: 2.8 MIL/uL — ABNORMAL LOW (ref 3.87–5.11)
RDW: 14.1 % (ref 11.5–15.5)
WBC: 8.8 10*3/uL (ref 4.0–10.5)
nRBC: 0 % (ref 0.0–0.2)

## 2023-11-24 LAB — PHOSPHORUS: Phosphorus: 4.2 mg/dL (ref 2.5–4.6)

## 2023-11-24 LAB — GLUCOSE, CAPILLARY
Glucose-Capillary: 93 mg/dL (ref 70–99)
Glucose-Capillary: 83 mg/dL (ref 70–99)
Glucose-Capillary: 95 mg/dL (ref 70–99)
Glucose-Capillary: 90 mg/dL (ref 70–99)
Glucose-Capillary: 84 mg/dL (ref 70–99)
Glucose-Capillary: 94 mg/dL (ref 70–99)

## 2023-11-24 LAB — MAGNESIUM: Magnesium: 4 mg/dL — ABNORMAL HIGH (ref 1.7–2.4)

## 2023-11-24 MED ORDER — PROPOFOL 1000 MG/100ML IV EMUL: 0.0000 ug/kg/min | INTRAVENOUS | Status: DC

## 2023-11-24 MED ORDER — LORAZEPAM 2 MG/ML IJ SOLN
1.0000 mg | INTRAMUSCULAR | Status: DC | PRN
  Administered 2023-11-25 (×2): 3 mg via INTRAVENOUS
  Filled 2023-11-24 (×2): qty 2

## 2023-11-24 MED ORDER — ADULT MULTIVITAMIN W/MINERALS CH: 1.0000 | ORAL_TABLET | Freq: Every day | ORAL | Status: DC

## 2023-11-24 MED ORDER — FENTANYL 2500MCG IN NS 250ML (10MCG/ML) PREMIX INFUSION: 0.0000 ug/h | INTRAVENOUS | Status: DC

## 2023-11-24 MED ORDER — LORAZEPAM 1 MG PO TABS: 1.0000 mg | ORAL_TABLET | ORAL | Status: DC | PRN

## 2023-11-24 MED ORDER — LORAZEPAM 1 MG PO TABS
1.0000 mg | ORAL_TABLET | ORAL | Status: DC | PRN
  Administered 2023-11-24: 2 mg via ORAL
  Filled 2023-11-24: qty 2

## 2023-11-24 MED ORDER — ONDANSETRON HCL 4 MG/2ML IJ SOLN
4.0000 mg | Freq: Four times a day (QID) | INTRAMUSCULAR | Status: DC | PRN
  Administered 2023-11-24: 4 mg via INTRAVENOUS
  Filled 2023-11-24: qty 2

## 2023-11-24 MED ORDER — POTASSIUM CHLORIDE 10 MEQ/100ML IV SOLN
10.0000 meq | INTRAVENOUS | Status: DC
Start: 2023-11-24 — End: 2023-11-24
  Administered 2023-11-24 (×4): 10 meq via INTRAVENOUS
  Filled 2023-11-24 (×5): qty 100

## 2023-11-24 MED ORDER — LORAZEPAM 2 MG/ML IJ SOLN
0.5000 mg | Freq: Once | INTRAMUSCULAR | Status: AC | PRN
  Administered 2023-11-24: 0.5 mg via INTRAVENOUS
  Filled 2023-11-24: qty 1

## 2023-11-24 MED ORDER — FOLIC ACID 1 MG PO TABS: 1.0000 mg | ORAL_TABLET | Freq: Every day | ORAL | Status: DC

## 2023-11-24 MED ORDER — LORAZEPAM 2 MG/ML IJ SOLN
0.0000 mg | INTRAMUSCULAR | Status: DC
  Administered 2023-11-24: 1 mg via INTRAVENOUS
  Filled 2023-11-24: qty 1

## 2023-11-24 MED ORDER — THIAMINE MONONITRATE 100 MG PO TABS: 100.0000 mg | ORAL_TABLET | Freq: Every day | ORAL | Status: DC

## 2023-11-24 MED ORDER — POTASSIUM CHLORIDE 20 MEQ PO PACK: 40.0000 meq | PACK | ORAL | Status: DC

## 2023-11-24 MED ORDER — POTASSIUM CHLORIDE CRYS ER 20 MEQ PO TBCR
40.0000 meq | EXTENDED_RELEASE_TABLET | Freq: Once | ORAL | Status: AC
  Administered 2023-11-24: 40 meq via ORAL
  Filled 2023-11-24: qty 2

## 2023-11-24 MED ORDER — THIAMINE MONONITRATE 100 MG PO TABS
500.0000 mg | ORAL_TABLET | Freq: Every day | ORAL | Status: DC
  Administered 2023-11-25: 500 mg via ORAL
  Filled 2023-11-24: qty 5

## 2023-11-24 MED ORDER — ADULT MULTIVITAMIN W/MINERALS CH
1.0000 | ORAL_TABLET | Freq: Every day | ORAL | Status: DC
  Administered 2023-11-25 – 2023-11-29 (×5): 1 via ORAL
  Filled 2023-11-24 (×5): qty 1

## 2023-11-24 MED ORDER — LORAZEPAM 2 MG/ML IJ SOLN: 1.0000 mg | INTRAMUSCULAR | Status: DC | PRN

## 2023-11-24 MED ORDER — DOCUSATE SODIUM 50 MG/5ML PO LIQD
100.0000 mg | Freq: Two times a day (BID) | ORAL | Status: DC
  Administered 2023-11-24: 100 mg
  Filled 2023-11-24: qty 10

## 2023-11-24 MED ORDER — LORAZEPAM 2 MG/ML IJ SOLN: 0.0000 mg | Freq: Three times a day (TID) | INTRAMUSCULAR | Status: DC

## 2023-11-24 MED ORDER — DOCUSATE SODIUM 100 MG PO CAPS
100.0000 mg | ORAL_CAPSULE | Freq: Two times a day (BID) | ORAL | Status: DC
Start: 2023-11-24 — End: 2023-11-29
  Administered 2023-11-25 – 2023-11-29 (×5): 100 mg via ORAL
  Filled 2023-11-24 (×6): qty 1

## 2023-11-24 MED ORDER — THIAMINE HCL 100 MG/ML IJ SOLN: 100.0000 mg | Freq: Every day | INTRAMUSCULAR | Status: DC

## 2023-11-24 MED ORDER — POLYETHYLENE GLYCOL 3350 17 G PO PACK
17.0000 g | PACK | Freq: Every day | ORAL | Status: DC
  Administered 2023-11-25: 17 g
  Filled 2023-11-24: qty 1

## 2023-11-24 MED ORDER — FOLIC ACID 1 MG PO TABS
1.0000 mg | ORAL_TABLET | Freq: Every day | ORAL | Status: DC
  Administered 2023-11-25 – 2023-11-29 (×5): 1 mg via ORAL
  Filled 2023-11-24 (×5): qty 1

## 2023-11-24 MED ORDER — FENTANYL BOLUS VIA INFUSION: 50.0000 ug | INTRAVENOUS | Status: DC | PRN

## 2023-11-24 MED ORDER — MAGNESIUM SULFATE 4 GM/100ML IV SOLN
4.0000 g | Freq: Once | INTRAVENOUS | Status: AC
  Administered 2023-11-24: 4 g via INTRAVENOUS
  Filled 2023-11-24: qty 100

## 2023-11-24 NOTE — Progress Notes (Signed)
 LTM maint complete - no skin breakdown seen Patient moved from ED to inpatient room. MRI compatible leads remain in place, impedence below 10kOhms, and study viewable on server.  [facility] monitored, Event button test confirmed by [facility].

## 2023-11-24 NOTE — Progress Notes (Signed)
MB performed maintenance on electrodes. All impedances are below 10k ohms. No skin breakdown noted.  

## 2023-11-24 NOTE — Procedures (Signed)
 Extubation Procedure Note  Patient Details:   Name: Alexandra Dorsey DOB: [birth year] MRN: [id number]   Airway Documentation:    Vent end date: 11/24/23 Vent end time: 1350   Evaluation  O2 sats: stable throughout Complications: No apparent complications Patient did tolerate procedure well. Bilateral Breath Sounds: Diminished, Rhonchi   Yes  Pt extubated to 4lpm w/o complications. + cuff leak; no stridor noted.   Bari CHRISTELLA [name] 11/24/2023, 1:51 PM

## 2023-11-24 NOTE — Procedures (Addendum)
 Patient Name: Alexandra Dorsey  MRN: [id number]  Epilepsy Attending: Arlin MALVA Krebs  Referring Physician/Provider: Khaliqdina, Salman, MD  Duration: 11/23/2023 1630 to 11/24/2023 1830  Patient history: 48 y.o. female with hx of EtOh use, EtOh withdrawal seizures, gout, HTN, who was brought in to the ED unresponsive by EMS. Has had 3 seizure today and intubated now. EEG to evaluate for seizure  Level of alertness:  comatose  AEDs during EEG study: Propofol   Technical aspects: This EEG study was done with scalp electrodes positioned according to the 10-20 International system of electrode placement. Electrical activity was reviewed with band pass filter of 1-70Hz , sensitivity of 7 uV/mm, display speed of 32mm/sec with a 60Hz  notched filter applied as appropriate. EEG data were recorded continuously and digitally stored.  Video monitoring was available and reviewed as appropriate.  Description: EEG showed continuous generalized 3 to 6 Hz theta-delta slowing admixed with 12-14Hz  beta activity distributed symmetrically and diffusely. Hyperventilation and photic stimulation were not performed.     EEG was disconnected for testing on 11/24/2023 between 0944 to 1125  Parts of eeg were difficult to interpret due to significant electrode artifact.  ABNORMALITY - Continuous slow, generalized  IMPRESSION: This study is suggestive of moderate diffuse encephalopathy likely related to sedation. No seizures or definite epileptiform discharges were seen throughout the recording.  Shamaya Kauer O Kalle Bernath

## 2023-11-24 NOTE — Progress Notes (Signed)
 NAME:  Alexandra Dorsey, MRN:  [id number], DOB:  [birth year], LOS: 1 ADMISSION DATE:  11/23/2023, CONSULTATION DATE:  2/6 REFERRING MD:  Randol, CHIEF COMPLAINT:  AMS, obtunded   History of Present Illness:  Pt is 48 yr female with ETOH abuse, anxiety, gout, HTN, hypothyroidism, ETOH abuse, accidental drug overdose, chronic prolonged QTC and recent hospitalization from 1/28 to 11/17/23 due to electrolyte abnormalities secondary to alcoholism presents to the ED with AMS. Per EMS, patient was attempting to self detox off alcohol for approximately 2 full days and had refused transporting to the hospital on 11/22/23. Patient was transported to the ED on 2/6 with AMS and multiple seizures in route. Patient was given 7.5 mg of IM versed during transport. Upon arrival to ED patient was receiving BVM and required intubation due inability to protect airway. PCCM consulted to admit and further manage patient. Neuro also consulted. HPI obtained from EMR.   Pertinent  Medical History  HTN Hypothyroidism  ETOH abuse  Accidental Drug Overdose  Anxiety  Gout   Significant Hospital Events: Including procedures, antibiotic start and stop dates in addition to other pertinent events   2/6 Admit under PCCM for Status Epilepticus   Interim History / Subjective:  Patient awakes to voice, is active. She is trying to pull out her ETT. Nods appropriately to questions.   Objective   Blood pressure (!) 87/59, pulse 84, temperature 99.3 F (37.4 C), resp. rate 20, height 5' (1.524 m), weight 61.8 kg, SpO2 96%.    Vent Mode: PRVC FiO2 (%):  [40 %-100 %] 40 % Set Rate:  [20 bmp] 20 bmp Vt Set:  [370 mL] 370 mL PEEP:  [5 cmH20] 5 cmH20 Plateau Pressure:  [16 cmH20-20 cmH20] 16 cmH20   Intake/Output Summary (Last 24 hours) at 11/24/2023 9352 Last data filed at 11/24/2023 0600 Gross per 24 hour  Intake 3340.42 ml  Output 500 ml  Net 2840.42 ml   Filed Weights   11/23/23 1400 11/23/23 1820  Weight: 60 kg 61.8 kg     Examination: General: acutely ill appearing female laying in bed intubated, NAD HENT: ETT in place,  Lungs: CTAB Cardiovascular: regular rate, rhythm Abdomen: soft, non-tender, non-distended Extremities: no edema Neuro: awake, follows commands. Nods appropriately to questioning GU: deferred   Resolved Hospital Problem list   N/a  Assessment & Plan:  Status epilepticus Lactic acidosis Patient actively seizing in arrival - 4 mg of ativan  given at that time (could be secondary to alcohol withdrawal vs severe hypomagnesemia). Also received benzos en route to hospital. CT head negative. Loaded with 3500 mg keppra . Eeg reviewed overnight by neurology and no electrographic seizures seen.  - Neuro consulted, appreciate recs - MRI pending - Propfol gtt, will wean once MRI is done and then extubate  - PRN ativan  for breakthrough seizures - Neuro protective measures: euglycemia, normothermia, normocapnia, optimize electrolytes  - Continuous LTM with neuro checks, will discontinue if MRI negative as no seizures seen on EEG - High dose thimaine 500 mg q8h x 5 days - Folic acid , MVI   Respiratory failure Failure to protect airway - Continue ventilator support, on minimal vent settings - Continue LTVV 6-8cc/kg  - HOB > 30 degrees - Plat pressures < 30  - VAP and PAD protocols in place  - propofol  as above, also on fentanyl  gtt  - Daily SBT, will attempt extubation after MRI  - Albuterol  PRN with duonebs  AKI - Continue to monitor and optimize electrolytes daily -  Continue to monitor urine output - Continue strict I/Os - Continue Adequate renal perfusion  - Avoid nephrotoxic agents   Hypomagnesemia - Mg 0.9, received 4 g mag sulfate - Will give an additional 4 g mag today   HTN - amlodipine  held due to hypotension   Hypothyroidism - restart levothyroxine  when appropriate  Gout  - holding allopurinol   Anxiety - holding lexapro  and seroquel   Best Practice (right click  and Reselect all SmartList Selections daily)   Diet/type: NPO DVT prophylaxis prophylactic heparin   Pressure ulcer(s): pressure ulcer assessment deferred  GI prophylaxis: PPI Lines: N/A Foley:  Yes, and it is still needed Code Status:  full code Last date of multidisciplinary goals of care discussion [pending]  Labs   CBC: Recent Labs  Lab 11/23/23 1356 11/23/23 1406 11/23/23 1850  WBC 14.0*  --  10.9*  NEUTROABS 11.2*  --   --   HGB 11.5* 11.6* 10.4*  HCT 34.1* 34.0* 29.4*  MCV 104.0*  --  99.7  PLT 240  --  192    Basic Metabolic Panel: Recent Labs  Lab 11/23/23 1356 11/23/23 1406 11/23/23 1850  NA 136 134*  --   K 3.6 3.6  --   CL 95*  --   --   CO2 22  --   --   GLUCOSE 120*  --   --   BUN 21*  --   --   CREATININE 2.23*  --  1.38*  CALCIUM  8.6*  --   --   MG  --   --  0.9*  PHOS  --   --  3.9   GFR: Estimated Creatinine Clearance: 41.8 mL/min (A) (by C-G formula based on SCr of 1.38 mg/dL (H)). Recent Labs  Lab 11/23/23 1356 11/23/23 1420 11/23/23 1850  PROCALCITON  --   --  <0.10  WBC 14.0*  --  10.9*  LATICACIDVEN  --  4.5* 1.6  1.7    Liver Function Tests: Recent Labs  Lab 11/23/23 1356  AST 29  ALT 15  ALKPHOS 58  BILITOT 0.3  PROT 6.3*  ALBUMIN 3.3*   No results for input(s): LIPASE, AMYLASE in the last 168 hours. Recent Labs  Lab 11/23/23 1410  AMMONIA 32    ABG    Component Value Date/Time   PHART 7.314 (L) 11/23/2023 1406   PCO2ART 50.2 (H) 11/23/2023 1406   PO2ART 356 (H) 11/23/2023 1406   HCO3 25.7 11/23/2023 1406   TCO2 27 11/23/2023 1406   ACIDBASEDEF 1.0 11/23/2023 1406   O2SAT 100 11/23/2023 1406     Coagulation Profile: Recent Labs  Lab 11/23/23 1356  INR 0.9    Cardiac Enzymes: Recent Labs  Lab 11/23/23 1850  CKTOTAL 29*    HbA1C: No results found for: HGBA1C  CBG: Recent Labs  Lab 11/23/23 1815 11/23/23 1927 11/23/23 2341 11/24/23 0321  GLUCAP 148* 134* 100* 93    Review of  Systems:   Unable to obtain   Past Medical History:  She,  has a past medical history of Alcohol abuse, Anxiety, Gout, and Hypertension.   Surgical History:  History reviewed. No pertinent surgical history.   Social History:   reports that she has quit smoking. She has never used smokeless tobacco. She reports current alcohol use of about 10.0 standard drinks of alcohol per week. She reports that she does not use drugs.   Family History:  Her family history includes Hypertension in her father and mother; Stroke in her maternal  grandfather.   Allergies Allergies  Allergen Reactions   Lisinopril  Cough   Norvasc  [Amlodipine  Besylate] Other (See Comments)    Feet hurts     Home Medications  Prior to Admission medications   Medication Sig Start Date End Date Taking? Authorizing Provider  acetaminophen  (TYLENOL ) 325 MG tablet Take 2 tablets (650 mg total) by mouth every 6 (six) hours as needed for mild pain (pain score 1-3) (or Fever >/= 101). 11/17/23   Samtani, Jai-Gurmukh, MD  albuterol  (VENTOLIN  HFA) 108 (90 Base) MCG/ACT inhaler 1 puff as needed Inhalation every 4 hrs for 30 days 06/28/23   [provider]  allopurinol  (ZYLOPRIM ) 300 MG tablet TAKE 1 TABLET BY MOUTH DAILY Orally Once a day    [provider]  amLODipine  (NORVASC ) 10 MG tablet Take 10 mg by mouth daily.    [provider]  clonazePAM (KLONOPIN) 1 MG tablet Take 1 mg by mouth 2 (two) times daily as needed for anxiety.    [provider]  escitalopram  (LEXAPRO ) 20 MG tablet Take 20 mg by mouth daily. 08/13/23   [provider]  folic acid  (FOLVITE ) 1 MG tablet Take 1 tablet (1 mg total) by mouth daily. 04/28/21   Sebastian Toribio GAILS, MD  levothyroxine  (SYNTHROID ) 25 MCG tablet 1 tablet in the morning on an empty stomach Orally Once a day 01/04/23   [provider]  omeprazole (PRILOSEC) 40 MG capsule 1 capsule 30 minutes before morning meal Orally Once a day 03/28/23    [provider]  ondansetron  (ZOFRAN -ODT) 4 MG disintegrating tablet Take 1 tablet (4 mg total) by mouth every 8 (eight) hours as needed for nausea or vomiting. Patient not taking: Reported on 03/21/2022 11/30/21   Freddi Hamilton, MD  potassium chloride  SA (KLOR-CON  M) 20 MEQ tablet Take 2 tablets (40 mEq total) by mouth daily. 11/17/23   Samtani, Jai-Gurmukh, MD  QUEtiapine  (SEROQUEL ) 200 MG tablet Take 200 mg by mouth at bedtime.    [provider]  traZODone  (DESYREL ) 50 MG tablet Take 50 mg by mouth at bedtime. 12/20/21   [provider]     Critical care time: 35 min

## 2023-11-24 NOTE — Progress Notes (Signed)
 LTM EEG discontinued - no skin breakdown at [facility].

## 2023-11-24 NOTE — Progress Notes (Signed)
 Pharmacy Electrolyte Replacement  Recent Labs:  Recent Labs    11/24/23 1130  K 3.0*  MG 4.0*  PHOS 4.2  CREATININE 1.10*    Low Critical Values (K </= 2.5, Phos </= 1, Mg </= 1) Present: None  MD Contacted: n/a  Plan: KCl 10mEq IV x6.  Continue to monitor.   Harlene Boga, PharmD, BCPS, BCCCP Please refer to [facility] for [facility] Pharmacy numbers 11/24/2023, 2:24 PM

## 2023-11-24 NOTE — Progress Notes (Signed)
 Patient is pulling off leads. Will seek guidance form Neuro Hospitalist if reapplication is needed.

## 2023-11-24 NOTE — Progress Notes (Addendum)
 Initial Nutrition Assessment  DOCUMENTATION CODES:  Not applicable  INTERVENTION:  Initiate tube feeding via OG: Osmolite 1.5 at 40 ml/h (960 ml per day) Prosource TF20 60 ml daily Provides 1520 kcal, 80 gm protein, 732 ml free water daily Propofol  at current rate provides an additional 234 kcal a day Continue thiamine , folic acid , and MVI  NUTRITION DIAGNOSIS:  Inadequate oral intake related to inability to eat (ventilated) as evidenced by NPO status.  GOAL:  Patient will meet greater than or equal to 90% of their needs  MONITOR:  TF tolerance, Weight trends, Labs, Diet advancement  REASON FOR ASSESSMENT:  Ventilator   ASSESSMENT:   Pt admitted for multiple seizures and was intubated in the ED. Pt with PMH of EtOH abuse, anxiety, gout, hypothyroidism, accidental drug overdose, chronic prolonged QTC. Recent admission from 1/28-1/31 with electrolyte abnormalities secondary to alcoholism.   2/6: Admitted to ICU; Intubated  Patient is currently intubated on ventilator support. OG placed confirmed by gastric xray Temp (24hrs), Avg:97.4 F (36.3 C), Min:95.4 F (35.2 C), Max:99.5 F (37.5 C)  Propofol : 8.88 ml/hr  Admit weight: 61.8 kg    Intake/Output Summary (Last 24 hours) at 11/24/2023 1309 Last data filed at 11/24/2023 1200 Gross per 24 hour  Intake 3993.41 ml  Output 500 ml  Net 3493.41 ml  Net IO Since Admission: 3,093.41 mL [11/24/23 1309]   DRAINS/LINES:  OG tube (gastric) UOP: 500 ml x 24 hours   Nutritionally Relevant Medications: Scheduled Meds:  folic acid   1 mg Per Tube Daily   multivitamin  15 mL Oral Daily   pantoprazole  (PROTONIX ) IV  40 mg Intravenous QHS   Continuous Infusions:  sodium chloride  75 mL/hr at 11/24/23 0800   magnesium  sulfate bolus IVPB 50 mL/hr at 11/24/23 0800   propofol  (DIPRIVAN ) infusion 25 mcg/kg/min (11/24/23 0800)   thiamine  (VITAMIN B1) injection Stopped (11/24/23 0600)   PRN Meds: docusate sodium , polyethylene  glycol  Labs Reviewed: CBG ranges from 83-148 mg/dL over the last 24 hours Magnesium  4.0 Potassium low Creatinine 1.10  NUTRITION - FOCUSED PHYSICAL EXAM:  Flowsheet Row Most Recent Value  Orbital Region No depletion  Upper Arm Region No depletion  Thoracic and Lumbar Region No depletion  Buccal Region No depletion  Temple Region No depletion  Clavicle Bone Region No depletion  Clavicle and Acromion Bone Region No depletion  Scapular Bone Region No depletion  Dorsal Hand No depletion  Patellar Region Severe depletion  Anterior Thigh Region Severe depletion  Posterior Calf Region Moderate depletion  Edema (RD Assessment) None  Hair Reviewed  Eyes Unable to assess  Mouth Unable to assess  Skin Reviewed  Nails Reviewed       Diet Order:   Diet Order             Diet NPO time specified  Diet effective now                   EDUCATION NEEDS:   Education needs have been addressed  Skin:  Skin Assessment: Reviewed RN Assessment  Last BM:  Bowel incontinence on 2/7  Height:   Ht Readings from Last 1 Encounters:  11/23/23 5' (1.524 m)    Weight:   Wt Readings from Last 1 Encounters:  11/23/23 61.8 kg    Ideal Body Weight:  45.5 kg  BMI:  Body mass index is 26.61 kg/m.  Estimated Nutritional Needs:   Kcal:  1500-1700  Protein:  75-90 g  Fluid:  1.5-1.7 L  Alexandra Ferraris, MS Dietetic Intern

## 2023-11-24 NOTE — Progress Notes (Addendum)
 NEUROLOGY CONSULT FOLLOW UP NOTE   Date of service: November 24, 2023 Patient Name: Alexandra Dorsey MRN:  [id number] DOB:  1976/10/01  Interval Hx/subjective  No seizure events overnight.   Vitals   Vitals:   11/24/23 0600 11/24/23 0700 11/24/23 0800 11/24/23 0809  BP: (!) 85/58 (!) 83/57 (!) 86/62   Pulse: 83 76 72 73  Resp: 18 20 20 20   Temp: 99.1 F (37.3 C) 98.6 F (37 C) 98.4 F (36.9 C) 98.4 F (36.9 C)  TempSrc:   Bladder   SpO2: 95% 98% 97% 98%  Weight:      Height:         Body mass index is 26.61 kg/m.  Physical Exam   Physical Exam HEENT- [place]/AT. EEG leads are in place.    Lungs- Intubated Extremities- Warm and well-perfused  Neurological Examination Mental Status: Awakens to sternal rub and follows some simple commands.  Cranial Nerves: II: PERRL.Fixates on visual stimuli.  III,IV, VI: EOMI. V, VII: Corneal reflexes intact bilaterally VIII: Hearing intact to voice IX,X: Intubated XI: Symmetric XII: Unable to assess Motor: RUE: 4+/5 LUE: 4+/5 RLE: 4+/5 LLE: 4+/5 Sensory: Reacts to gross touch x 4 Deep Tendon Reflexes: 2+ bilateral brachioradialis. 4+ bilateral patellar and achilles reflexes. Toes upgoing bilaterally.  Plantars: Upgoing bilaterally Cerebellar: Unable to assess due to patient sedation Gait: Unable to assess   Medications  Current Facility-Administered Medications:    0.9 %  sodium chloride  infusion, , Intravenous, Continuous, Claudene Fonda BROCKS, NP, Last Rate: 75 mL/hr at 11/24/23 0800, Infusion Verify at 11/24/23 0800   acetaminophen  (TYLENOL ) tablet 650 mg, 650 mg, Oral, Q4H PRN **OR** acetaminophen  (TYLENOL ) suppository 650 mg, 650 mg, Rectal, Q4H PRN, Claudene Fonda BROCKS, NP   albuterol  (PROVENTIL ) (2.5 MG/3ML) 0.083% nebulizer solution 2.5 mg, 2.5 mg, Nebulization, Q2H PRN, Claudene Fonda BROCKS, NP   Chlorhexidine  Gluconate Cloth 2 % PADS 6 each, 6 each, Topical, Daily, Agarwala, Ravi, MD, 6 each at 11/23/23 1820   docusate sodium   (COLACE) capsule 100 mg, 100 mg, Oral, BID PRN, Smith, Joshua C, NP   fentaNYL  (SUBLIMAZE ) bolus via infusion 50-100 mcg, 50-100 mcg, Intravenous, Q15 min PRN, Randol Simmonds, MD, 100 mcg at 11/24/23 9671   fentaNYL  (SUBLIMAZE ) injection 50 mcg, 50 mcg, Intravenous, Q15 min PRN, Randol Simmonds, MD   fentaNYL  (SUBLIMAZE ) injection 50-200 mcg, 50-200 mcg, Intravenous, Q30 min PRN, Randol Simmonds, MD, 100 mcg at 11/24/23 9666   fentaNYL  in NS (59mcg/ml) infusion-PREMIX, 50-200 mcg/hr, Intravenous, Continuous, Randol Simmonds, MD, Last Rate: 2.5 mL/hr at 11/23/23 1757, 25 mcg/hr at 11/23/23 1757   folic acid  (FOLVITE ) tablet 1 mg, 1 mg, Per Tube, Daily, Claudene Fonda C, NP   heparin  injection 5,000 Units, 5,000 Units, Subcutaneous, Q8H, Claudene Fonda C, NP, 5,000 Units at 11/24/23 9471   magnesium  sulfate IVPB 4 g 100 mL, 4 g, Intravenous, Once, Atway, Rayann N, DO, Last Rate: 50 mL/hr at 11/24/23 0800, Infusion Verify at 11/24/23 0800   multivitamin liquid 15 mL, 15 mL, Oral, Daily, Smith, Joshua C, NP   Oral care mouth rinse, 15 mL, Mouth Rinse, Q2H, Claudene Fonda C, NP, 15 mL at 11/23/23 2204   Oral care mouth rinse, 15 mL, Mouth Rinse, Q2H, Agarwala, Ravi, MD, 15 mL at 11/24/23 0734   Oral care mouth rinse, 15 mL, Mouth Rinse, PRN, Agarwala, Ravi, MD   pantoprazole  (PROTONIX ) injection 40 mg, 40 mg, Intravenous, QHS, Smith, Joshua C, NP, 40 mg at 11/23/23 2210  polyethylene glycol (MIRALAX  / GLYCOLAX ) packet 17 g, 17 g, Oral, Daily PRN, Claudene Fonda BROCKS, NP   propofol  (DIPRIVAN ) 1000 MG/100ML infusion, 20 mcg/kg/min, Intravenous, Continuous, Claudene Fonda BROCKS, NP, Last Rate: 8.88 mL/hr at 11/24/23 0800, 25 mcg/kg/min at 11/24/23 0800   sodium chloride  flush (NS) 0.9 % injection 3-10 mL, 3-10 mL, Intravenous, Q12H, Claudene Fonda C, NP, 3 mL at 11/23/23 2204   sodium chloride  flush (NS) 0.9 % injection 3-10 mL, 3-10 mL, Intravenous, PRN, Claudene Fonda BROCKS, NP   thiamine  (VITAMIN B1) 500 mg in sodium chloride   0.9 % 50 mL IVPB, 500 mg, Intravenous, Q8H, Claudene Fonda BROCKS, NP, Stopped at 11/24/23 0600  Labs and Diagnostic Imaging   CBC:  Recent Labs  Lab 11/23/23 1356 11/23/23 1406 11/23/23 1850  WBC 14.0*  --  10.9*  NEUTROABS 11.2*  --   --   HGB 11.5* 11.6* 10.4*  HCT 34.1* 34.0* 29.4*  MCV 104.0*  --  99.7  PLT 240  --  192    Basic Metabolic Panel:  Lab Results  Component Value Date   NA 134 (L) 11/23/2023   K 3.6 11/23/2023   CO2 22 11/23/2023   GLUCOSE 120 (H) 11/23/2023   BUN 21 (H) 11/23/2023   CREATININE 1.38 (H) 11/23/2023   CALCIUM  8.6 (L) 11/23/2023   GFRNONAA 48 (L) 11/23/2023   GFRAA 134 03/29/2018   Lipid Panel:  Lab Results  Component Value Date   LDLCALC 97 08/06/2016   HgbA1c: No results found for: HGBA1C Urine Drug Screen:     Component Value Date/Time   LABOPIA NONE DETECTED 11/23/2023 1357   COCAINSCRNUR NONE DETECTED 11/23/2023 1357   LABBENZ POSITIVE (A) 11/23/2023 1357   AMPHETMU NONE DETECTED 11/23/2023 1357   THCU NONE DETECTED 11/23/2023 1357   LABBARB NONE DETECTED 11/23/2023 1357    Alcohol Level     Component Value Date/Time   ETH <10 11/23/2023 1356   INR  Lab Results  Component Value Date   INR 0.9 11/23/2023   APTT  Lab Results  Component Value Date   APTT 25 11/23/2023     Assessment  Alexandra Dorsey is a 48 y.o. female with hx of EtOH use, EtOH withdrawal seizures, gout and HTN, who was brought in to the ED unresponsive by EMS. Had 3 seizures PTA and was intubated at the time of initial Neurology assessment. On initial evaluation Thursday, she was noted to have a 1 min GTC seizure with arms drawn closer to chest, eyes open and staring off, increased tone in BLE. Seizure resolved spontaneously prior to getting Ativan . Exam post-seizure and Ativan  4 mg revealed obtunded mentation and no brainstem reflexes. CT head revealed no evidence of acute intracranial abnormality. She was given a one-time loading dose of Keppra , which  has not been continued.  - Exam today reveals a sedated and intubated patient who can be aroused with sternal rub and can follow some simple commands. No jerking, twitching or other seizure-like activity noted. - LTM EEG report for today AM: Continuous slow, generalized. This study is suggestive of moderate diffuse encephalopathy likely related to sedation. No seizures or definite epileptiform discharges were seen throughout the recording. 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 Most likely she will not need long term AEDs.  Recommendations  - Plan is to get MRI, then wean off propofol  (now at 25) and extubate. If no seizures on EEG at that time I would anticipate discontinuing LTM.  - Frequent neuro checks -  Inpatient seizure precautions - Alcohol withdrawal protocol per CCM.  ______________________________________________________________________   Bonney SHARK, Lucah Petta, MD [facility]

## 2023-11-24 NOTE — Progress Notes (Signed)
EEG maint complete.  ?

## 2023-11-24 NOTE — Progress Notes (Signed)
Pt passed bedside swallow test.

## 2023-11-24 NOTE — Progress Notes (Signed)
 eLink Physician-Brief Progress Note Patient Name: Alexandra Dorsey DOB: 1976/02/02 MRN: [id number]   Date of Service  11/24/2023  HPI/Events of Note  Informal CIWA score of 12 per bedside RN. Patient hospitalized for ETOH withdrawal seizures.  eICU Interventions  CIWA protocol ordered.        [name] U Chike Farrington 11/24/2023, 10:35 PM

## 2023-11-24 NOTE — Plan of Care (Signed)
 Overnight on-call neurology note  Notified by EEG tech that the patient has been pulling on her leads. She has been extubated since the last neurological exam Per the ICU RN, there might be a component of withdrawal right now. She is nonfocal and following commands per the ICU RN. EEG thus far has been unremarkable for seizures At this point, it is okay to discontinue LTM and remove the leads for patient comfort.   -- Eligio Lav, MD Neurologist [facility]

## 2023-11-25 DIAGNOSIS — E039 Hypothyroidism, unspecified: Secondary | ICD-10-CM | POA: Diagnosis not present

## 2023-11-25 DIAGNOSIS — G9341 Metabolic encephalopathy: Secondary | ICD-10-CM | POA: Diagnosis not present

## 2023-11-25 DIAGNOSIS — J9601 Acute respiratory failure with hypoxia: Secondary | ICD-10-CM | POA: Diagnosis not present

## 2023-11-25 DIAGNOSIS — R569 Unspecified convulsions: Secondary | ICD-10-CM | POA: Diagnosis not present

## 2023-11-25 LAB — BASIC METABOLIC PANEL
Anion gap: 15 (ref 5–15)
BUN: 5 mg/dL — ABNORMAL LOW (ref 6–20)
CO2: 22 mmol/L (ref 22–32)
Calcium: 8.7 mg/dL — ABNORMAL LOW (ref 8.9–10.3)
Chloride: 101 mmol/L (ref 98–111)
Creatinine, Ser: 0.68 mg/dL (ref 0.44–1.00)
GFR, Estimated: >60 mL/min (ref >60)
Glucose, Bld: 84 mg/dL (ref 70–99)
Potassium: 4.1 mmol/L (ref 3.5–5.1)
Sodium: 138 mmol/L (ref 135–145)

## 2023-11-25 LAB — TRIGLYCERIDES: Triglycerides: 263 mg/dL — ABNORMAL HIGH (ref <150)

## 2023-11-25 LAB — GLUCOSE, CAPILLARY
Glucose-Capillary: 89 mg/dL (ref 70–99)
Glucose-Capillary: 86 mg/dL (ref 70–99)
Glucose-Capillary: 72 mg/dL (ref 70–99)
Glucose-Capillary: 81 mg/dL (ref 70–99)
Glucose-Capillary: 76 mg/dL (ref 70–99)
Glucose-Capillary: 76 mg/dL (ref 70–99)

## 2023-11-25 LAB — CBC
HCT: 31 % — ABNORMAL LOW (ref 36.0–46.0)
Hemoglobin: 10.3 g/dL — ABNORMAL LOW (ref 12.0–15.0)
MCH: 34.3 pg — ABNORMAL HIGH (ref 26.0–34.0)
MCHC: 33.2 g/dL (ref 30.0–36.0)
MCV: 103.3 fL — ABNORMAL HIGH (ref 80.0–100.0)
Platelets: 236 10*3/uL (ref 150–400)
RBC: 3 MIL/uL — ABNORMAL LOW (ref 3.87–5.11)
RDW: 13.6 % (ref 11.5–15.5)
WBC: 8.5 10*3/uL (ref 4.0–10.5)
nRBC: 0 % (ref 0.0–0.2)

## 2023-11-25 LAB — MAGNESIUM: Magnesium: 2.2 mg/dL (ref 1.7–2.4)

## 2023-11-25 MED ORDER — PHENOBARBITAL 32.4 MG PO TABS
97.2000 mg | ORAL_TABLET | Freq: Three times a day (TID) | ORAL | Status: AC
  Administered 2023-11-25 – 2023-11-27 (×6): 97.2 mg via ORAL
  Filled 2023-11-25 (×6): qty 3

## 2023-11-25 MED ORDER — PHENOBARBITAL 32.4 MG PO TABS
32.4000 mg | ORAL_TABLET | Freq: Three times a day (TID) | ORAL | Status: DC
  Administered 2023-11-29: 32.4 mg via ORAL
  Filled 2023-11-25: qty 1

## 2023-11-25 MED ORDER — LEVOTHYROXINE SODIUM 25 MCG PO TABS
25.0000 ug | ORAL_TABLET | Freq: Every day | ORAL | Status: DC
  Administered 2023-11-25 – 2023-11-29 (×5): 25 ug via ORAL
  Filled 2023-11-25 (×5): qty 1

## 2023-11-25 MED ORDER — ORAL CARE MOUTH RINSE: 15.0000 mL | OROMUCOSAL | Status: DC | PRN

## 2023-11-25 MED ORDER — SODIUM CHLORIDE 0.9 % IV SOLN
260.0000 mg | Freq: Once | INTRAVENOUS | Status: AC
  Administered 2023-11-25: 260 mg via INTRAVENOUS
  Filled 2023-11-25: qty 2

## 2023-11-25 MED ORDER — LORAZEPAM 2 MG/ML IJ SOLN
1.0000 mg | INTRAMUSCULAR | Status: DC | PRN
  Administered 2023-11-25 – 2023-11-28 (×4): 1 mg via INTRAVENOUS
  Filled 2023-11-25 (×4): qty 1

## 2023-11-25 MED ORDER — PHENOBARBITAL 32.4 MG PO TABS
64.8000 mg | ORAL_TABLET | Freq: Three times a day (TID) | ORAL | Status: AC
  Administered 2023-11-27 – 2023-11-29 (×6): 64.8 mg via ORAL
  Filled 2023-11-25 (×6): qty 2

## 2023-11-25 MED ORDER — THIAMINE HCL 100 MG/ML IJ SOLN
500.0000 mg | Freq: Three times a day (TID) | INTRAVENOUS | Status: AC
  Administered 2023-11-25 – 2023-11-26 (×6): 500 mg via INTRAVENOUS
  Filled 2023-11-25 (×6): qty 5

## 2023-11-25 MED ORDER — LORAZEPAM 2 MG/ML IJ SOLN
1.0000 mg | INTRAMUSCULAR | Status: DC | PRN
  Administered 2023-11-25: 1 mg via INTRAVENOUS
  Filled 2023-11-25: qty 1

## 2023-11-25 NOTE — Evaluation (Signed)
 Physical Therapy Evaluation Patient Details Name: Alexandra Dorsey MRN: [id number] DOB: [birth year] Today's Date: 11/25/2023  History of Present Illness  Pt is a 48 y/o F presenting to ED 2/6 with seizures, attempting to self detox, CT head negative MRI with nonspecific findings but can be seen in setting of Wernicke's encephalopathy. Intubated 2/6-2/7. PMH  includes gout, anxiety, ETOH abuse, hypothyroidism, accidental drug overdose, chornic prolonged QTC and recent hospitalization.  Clinical Impression  Pt admitted with above diagnosis. Progressed mobility out of bed and ambulating up to 80 feet with min assist and RW for support. SpO2 90% and greater while ambulating on RA with HR in 90s to low 100s. Still a bit confused, oriented to self and location but could not recall month. Anticipate progressing well enough to return home with HHPT. We will work with her during admission to progress safety and function. Pt currently with functional limitations due to the deficits listed below (see PT Problem List). Pt will benefit from acute skilled PT to increase their independence and safety with mobility to allow discharge.           If plan is discharge home, recommend the following: A little help with walking and/or transfers;A little help with bathing/dressing/bathroom;Assistance with cooking/housework;Direct supervision/assist for medications management;Direct supervision/assist for financial management;Assist for transportation;Supervision due to cognitive status   Can travel by private vehicle        Equipment Recommendations Rolling walker (2 wheels) (may progress to no needs.)  Recommendations for Other Services       Functional Status Assessment Patient has had a recent decline in their functional status and demonstrates the ability to make significant improvements in function in a reasonable and predictable amount of time.     Precautions / Restrictions Precautions Precautions:  Fall Precaution Comments: watch O2 Restrictions Weight Bearing Restrictions Per Provider Order: No      Mobility  Bed Mobility Overal bed mobility: Needs Assistance Bed Mobility: Supine to Sit, Sit to Supine     Supine to sit: Min assist Sit to supine: Supervision   General bed mobility comments: Min assist to rise and scoot to EOB, cues for technique. Supervision to return to supine.    Transfers Overall transfer level: Needs assistance Equipment used: Rolling walker (2 wheels) Transfers: Sit to/from Stand Sit to Stand: Min assist           General transfer comment: Min assist for balance rising to standing position, holds RW for support to steady herself.    Ambulation/Gait Ambulation/Gait assistance: Min assist Gait Distance (Feet): 80 Feet Assistive device: Rolling walker (2 wheels) Gait Pattern/deviations: Step-through pattern, Decreased stride length, Staggering right, Drifts right/left Gait velocity: dec Gait velocity interpretation: <1.31 ft/sec, indicative of household ambulator   General Gait Details: Intermittent min assist for RW control and to correct LOB, mainly with turns. Cues for awareness of surroundings, drifting towards walls and obstacles. SpO2 90% and greater on RA while ambulating HR in 90s - low 100s. Denies dizziness.  Stairs            Wheelchair Mobility     Tilt Bed    Modified Rankin (Stroke Patients Only)       Balance Overall balance assessment: Needs assistance Sitting-balance support: Feet supported Sitting balance-Leahy Scale: Fair     Standing balance support: During functional activity, Reliant on assistive device for balance Standing balance-Leahy Scale: Poor Standing balance comment: reliant on RW support  Pertinent Vitals/Pain Pain Assessment Pain Assessment: Faces Faces Pain Scale: Hurts little more Pain Location: L shoulder Pain Descriptors / Indicators:  Discomfort Pain Intervention(s): Monitored during session    Home Living Family/patient expects to be discharged to:: Private residence Living Arrangements: Alone Available Help at Discharge: Friend(s);Available PRN/intermittently Type of Home: Apartment Home Access: Level entry       Home Layout: One level Home Equipment: None      Prior Function Prior Level of Function : Driving;Independent/Modified Independent             Mobility Comments: ind ADLs Comments: ind, reports neighbor sometimes assists with going to the store, reports he has a key to her apt     Extremity/Trunk Assessment   Upper Extremity Assessment Upper Extremity Assessment: Defer to OT evaluation    Lower Extremity Assessment Lower Extremity Assessment: Generalized weakness    Cervical / Trunk Assessment Cervical / Trunk Assessment: Normal  Communication   Communication Communication: No apparent difficulties  Cognition Arousal: Alert Behavior During Therapy: Restless, Lability Overall Cognitive Status: Impaired/Different from baseline Area of Impairment: Orientation, Attention, Memory, Following commands, Safety/judgement, Awareness, Problem solving                 Orientation Level: Disoriented to, Situation, Time Current Attention Level: Focused Memory: Decreased short-term memory Following Commands: Follows one step commands with increased time Safety/Judgement: Decreased awareness of deficits, Decreased awareness of safety Awareness: Anticipatory Problem Solving: Slow processing General Comments: unaware of month, though does recall it is february later on in session, requests to have catheter taken out mulitple times so she can pee        General Comments General comments (skin integrity, edema, etc.): HR 120s a max when she first sat up in bed but reduced remainder to session.    Exercises     Assessment/Plan    PT Assessment Patient needs continued PT services  PT  Problem List Decreased strength;Decreased range of motion;Decreased activity tolerance;Decreased balance;Decreased mobility;Decreased coordination;Decreased cognition;Decreased knowledge of use of DME;Decreased safety awareness;Decreased knowledge of precautions;Cardiopulmonary status limiting activity;Pain       PT Treatment Interventions DME instruction;Gait training;Functional mobility training;Therapeutic activities;Therapeutic exercise;Balance training;Neuromuscular re-education;Cognitive remediation;Patient/family education    PT Goals (Current goals can be found in the Care Plan section)  Acute Rehab PT Goals Patient Stated Goal: Go home PT Goal Formulation: With patient Time For Goal Achievement: 12/09/23 Potential to Achieve Goals: Good    Frequency Min 1X/week     Co-evaluation PT/OT/SLP Co-Evaluation/Treatment: Yes Reason for Co-Treatment: Complexity of the patient's impairments (multi-system involvement);For patient/therapist safety;To address functional/ADL transfers;Necessary to address cognition/behavior during functional activity PT goals addressed during session: Mobility/safety with mobility;Balance;Proper use of DME OT goals addressed during session: ADL's and self-care;Strengthening/ROM       AM-PAC PT 6 Clicks Mobility  Outcome Measure Help needed turning from your back to your side while in a flat bed without using bedrails?: A Little Help needed moving from lying on your back to sitting on the side of a flat bed without using bedrails?: A Little Help needed moving to and from a bed to a chair (including a wheelchair)?: A Little Help needed standing up from a chair using your arms (e.g., wheelchair or bedside chair)?: A Little Help needed to walk in hospital room?: A Little Help needed climbing 3-5 steps with a railing? : A Lot 6 Click Score: 17    End of Session Equipment Utilized During Treatment: Gait belt;Oxygen Activity Tolerance: Patient tolerated  treatment well Patient left: in bed;with call bell/phone within reach;with bed alarm set Nurse Communication: Mobility status PT Visit Diagnosis: Unsteadiness on feet (R26.81);Other abnormalities of gait and mobility (R26.89);Repeated falls (R29.6);Muscle weakness (generalized) (M62.81);History of falling (Z91.81);Other symptoms and signs involving the nervous system (R29.898);Pain Pain - Right/Left: Left Pain - part of body: Shoulder    Time: 8557-8494 PT Time Calculation (min) (ACUTE ONLY): 23 min   Charges:   PT Evaluation $PT Eval Moderate Complexity: 1 Mod   PT General Charges $$ ACUTE PT VISIT: 1 Visit         Leontine Roads, PT, DPT [facility]  Rehabilitation Services Physical Therapist Office: [phone] Website: [email or web address]   Leontine GORMAN Roads 11/25/2023, 4:53 PM

## 2023-11-25 NOTE — Progress Notes (Signed)
 Pt has pulled out 4th IV. She is A&O x4 but very forgetful and repeating same questions over and over.

## 2023-11-25 NOTE — Procedures (Addendum)
 Patient Name: Alexandra Dorsey  MRN: [id number]  Epilepsy Attending: Arlin MALVA Krebs  Referring Physician/Provider: Khaliqdina, Salman, MD  Duration: 11/24/2023 1830 to 11/24/2023 2230   Patient history: 48 y.o. female with hx of EtOh use, EtOh withdrawal seizures, gout, HTN, who was brought in to the ED unresponsive by EMS. Has had 3 seizure today and intubated now. EEG to evaluate for seizure   Level of alertness:  awake   AEDs during EEG study: Phenobarb   Technical aspects: This EEG study was done with scalp electrodes positioned according to the 10-20 International system of electrode placement. Electrical activity was reviewed with band pass filter of 1-70Hz , sensitivity of 7 uV/mm, display speed of 26mm/sec with a 60Hz  notched filter applied as appropriate. EEG data were recorded continuously and digitally stored.  Video monitoring was available and reviewed as appropriate.   Description: EEG showed continuous generalized predominantly 5 to 6 Hz theta slowing admixed with intermittent 2-3hz  delta slowing. Hyperventilation and photic stimulation were not performed.      Parts of eeg were difficult to interpret due to significant electrode artifact.   ABNORMALITY - Continuous slow, generalized   IMPRESSION: This technically difficult study is suggestive of mild to moderate diffuse encephalopathy. No seizures or definite epileptiform discharges were seen throughout the recording.   Adger Cantera O Rebeckah Masih

## 2023-11-25 NOTE — Progress Notes (Signed)
 NEUROLOGY CONSULT FOLLOW UP NOTE   Date of service: November 25, 2023 Patient Name: Alexandra Dorsey MRN:  [id number] DOB:  Mar 27, 1976  Interval Hx/subjective  The patient tore her EEG leads off overnight. Now awake and following commands.   Vitals   Vitals:   11/25/23 0900 11/25/23 1000 11/25/23 1030 11/25/23 1035  BP:  (!) 85/57 100/71   Pulse:  91  88  Resp: 16     Temp: 98.6 F (37 C)     TempSrc:      SpO2:      Weight:      Height:         Body mass index is 26.61 kg/m.  Physical Exam    HENT: No OP obstrucion.  Head: Normocephalic.  Respiratory: Effort normal, non-labored breathing.    Neurologic Examination   Mental Status: Initially asleep, she can be awakened after sustained light sternal rub to an alert state with decreased attention. Speech is intact.   Cranial Nerves: II: Fixates on visual stimuli.  III,IV, VI: EOMI. VII: Face is symmetric VIII: Hearing intact to voice IX,X: Gag reflex deferred XI: Symmetric Motor: RUE: 4+/5 LUE: 4+/5 RLE: 4+/5 LLE: 4+/5 Sensory: Reacts to gross touch x 4 Deep Tendon Reflexes: Now less hyperactive than was noted on exam yesterday.  Cerebellar: No ataxia with FNF Gait: Deferred Other: No jerking, twitching or other clinical seizure activity noted.  Medications  Current Facility-Administered Medications:    acetaminophen  (TYLENOL ) tablet 650 mg, 650 mg, Oral, Q4H PRN, 650 mg at 11/25/23 0526 **OR** acetaminophen  (TYLENOL ) suppository 650 mg, 650 mg, Rectal, Q4H PRN, Smith, Joshua C, NP   albuterol  (PROVENTIL ) (2.5 MG/3ML) 0.083% nebulizer solution 2.5 mg, 2.5 mg, Nebulization, Q2H PRN, Smith, Joshua C, NP   Chlorhexidine  Gluconate Cloth 2 % PADS 6 each, 6 each, Topical, Daily, Agarwala, Ravi, MD, 6 each at 11/25/23 0854   docusate sodium  (COLACE) capsule 100 mg, 100 mg, Oral, BID PRN, Smith, Joshua C, NP   docusate sodium  (COLACE) capsule 100 mg, 100 mg, Oral, BID, Mannam, Praveen, MD, 100 mg at 11/25/23  0857   fentaNYL  (SUBLIMAZE ) bolus via infusion 50-100 mcg, 50-100 mcg, Intravenous, Q15 min PRN, Mannam, Praveen, MD   folic acid  (FOLVITE ) tablet 1 mg, 1 mg, Oral, Daily, Ogan, Okoronkwo U, MD, 1 mg at 11/25/23 0858   heparin  injection 5,000 Units, 5,000 Units, Subcutaneous, Q8H, Smith, Joshua C, NP, 5,000 Units at 11/25/23 9487   levothyroxine  (SYNTHROID ) tablet 25 mcg, 25 mcg, Oral, Q0600, Mannam, Praveen, MD, 25 mcg at 11/25/23 9157   LORazepam  (ATIVAN ) injection 1 mg, 1 mg, Intravenous, Q4H PRN, Mannam, Praveen, MD   multivitamin with minerals tablet 1 tablet, 1 tablet, Oral, Daily, Ogan, Okoronkwo U, MD, 1 tablet at 11/25/23 0858   ondansetron  (ZOFRAN ) injection 4 mg, 4 mg, Intravenous, Q6H PRN, Atway, Rayann N, DO, 4 mg at 11/24/23 1807   Oral care mouth rinse, 15 mL, Mouth Rinse, PRN, Mannam, Praveen, MD   pantoprazole  (PROTONIX ) injection 40 mg, 40 mg, Intravenous, QHS, Smith, Joshua C, NP, 40 mg at 11/24/23 2130   PHENobarbital  (LUMINAL) tablet 97.2 mg, 97.2 mg, Oral, Q8H **FOLLOWED BY** [START ON 11/27/2023] PHENobarbital  (LUMINAL) tablet 64.8 mg, 64.8 mg, Oral, Q8H **FOLLOWED BY** [START ON 11/29/2023] PHENobarbital  (LUMINAL) tablet 32.4 mg, 32.4 mg, Oral, Q8H, Mannam, Praveen, MD   polyethylene glycol (MIRALAX  / GLYCOLAX ) packet 17 g, 17 g, Oral, Daily PRN, Smith, Joshua C, NP   polyethylene glycol (MIRALAX  / GLYCOLAX ) packet 17 g,  17 g, Per Tube, Daily, Mannam, Praveen, MD, 17 g at 11/25/23 0918   sodium chloride  flush (NS) 0.9 % injection 3-10 mL, 3-10 mL, Intravenous, Q12H, Claudene Chew C, NP, 10 mL at 11/25/23 0858   sodium chloride  flush (NS) 0.9 % injection 3-10 mL, 3-10 mL, Intravenous, PRN, Smith, Joshua C, NP   thiamine  (VITAMIN B1) 500 mg in sodium chloride  0.9 % 50 mL IVPB, 500 mg, Intravenous, TID, Mannam, Praveen, MD, Last Rate: 110 mL/hr at 11/25/23 0912, 500 mg at 11/25/23 0912  Labs and Diagnostic Imaging   CBC:  Recent Labs  Lab 11/23/23 1356 11/23/23 1406  11/24/23 1130 11/25/23 0521  WBC 14.0*   < > 8.8 8.5  NEUTROABS 11.2*  --   --   --   HGB 11.5*   < > 9.7* 10.3*  HCT 34.1*   < > 29.7* 31.0*  MCV 104.0*   < > 106.1* 103.3*  PLT 240   < > 219 236   < > = values in this interval not displayed.    Basic Metabolic Panel:  Lab Results  Component Value Date   NA 138 11/25/2023   K 4.1 11/25/2023   CO2 22 11/25/2023   GLUCOSE 84 11/25/2023   BUN 5 (L) 11/25/2023   CREATININE 0.68 11/25/2023   CALCIUM  8.7 (L) 11/25/2023   GFRNONAA >60 11/25/2023   GFRAA 134 03/29/2018   Lipid Panel:  Lab Results  Component Value Date   LDLCALC 97 08/06/2016   HgbA1c: No results found for: HGBA1C Urine Drug Screen:     Component Value Date/Time   LABOPIA NONE DETECTED 11/23/2023 1357   COCAINSCRNUR NONE DETECTED 11/23/2023 1357   LABBENZ POSITIVE (A) 11/23/2023 1357   AMPHETMU NONE DETECTED 11/23/2023 1357   THCU NONE DETECTED 11/23/2023 1357   LABBARB NONE DETECTED 11/23/2023 1357    Alcohol Level     Component Value Date/Time   ETH <10 11/23/2023 1356   INR  Lab Results  Component Value Date   INR 0.9 11/23/2023   APTT  Lab Results  Component Value Date   APTT 25 11/23/2023     Assessment  48 y.o. female with a PMHx of EtOH abuse, EtOH withdrawal seizures, gout and HTN who was brought in to the ED unresponsive by EMS on Thursday afternoon. She had been self-detoxing off EtOH with last drink 3 days prior to presentation. She had 1 seizure before EMS arrival, then 2 more witnessed seizures while en route to the ED. Initially intubated, she is now extubated and can be awakened to a drowsy state with impaired attention. She was given a one-time loading dose of Keppra , which has not been continued. Currently on phenobarbital  for EtOH withdrawal. No further seizures this admission. - Exam reveals a patient who can be awakened after sustained light sternal rub to an alert state with decreased attention. No focal motor deficit is  appreciated. No jerking, twitching or other clinical seizure activity noted.  - LTM EEG report for this AM (11/24/2023 1830 to 11/24/2023 2230): Continuous slow, generalized. This technically difficult study is suggestive of mild to moderate diffuse encephalopathy. No seizures or definite epileptiform discharges were seen throughout the recording. EEG performed while the patient was medicated with phenobarbital .  - The patient tore her leads off overnight.  - Overall impression:  - EtOH withdrawal seizures.  - Acute Wernicke's encephalopathy  Recommendations  - LTM EEG discontinued. - No indication for long term AEDs.  - Inpatient seizure precautions -  Alcohol withdrawal protocol per CCM.  - Continue high-dose thiamine  protocol: 500 mg IV TID x 3 days, followed by 100 mg po every day thereafter. Should stay on thiamine  PO indefinitely.  - Neurohospitalist service will sign off, please call if there are additional questions.  ______________________________________________________________________   Alexandra Dorsey, Alexandra Frie, MD [facility]

## 2023-11-25 NOTE — Progress Notes (Signed)
 PT Cancellation Note  Patient Details Name: Alexandra Dorsey MRN: [id number] DOB: [birth year]   Cancelled Treatment:    Reason Eval/Treat Not Completed: Fatigue/lethargy limiting ability to participate  Sleeping soundly, not awake enough to participate at this time. Notified RN. Will try again this afternoon.  Leontine Roads, PT, DPT [facility]  Rehabilitation Services Physical Therapist Office: [phone] Website: [email or web address]   Leontine GORMAN Roads 11/25/2023, 11:34 AM

## 2023-11-25 NOTE — Evaluation (Signed)
 Occupational Therapy Evaluation Patient Details Name: Alexandra Dorsey MRN: [id number] DOB: 05-Apr-1976 Today's Date: 11/25/2023   History of Present Illness Pt is a 48 y/o F presenting to ED with seizures, attempting to self detox, CT head negative MRI with nonspecific findings but can be seen in setting of Wernicke's encephalopathy. PMH  includes gout, anxiety, ETOH abuse, hypothyroidism, accidental drug overdose, chornic prolonged QTC and recent hospitalization.   Clinical Impression   Pt reports ind at baseline with ADLs and functional mobilities, lives alone in a first floor apartment, has a neighbor that assists PRN. Pt currently with decr cognition, needing min A overall for ALDs, supervision for bed mobility and min A for transfers with RW. Pt with L shoulder pain, also needs some physical assist to navigate environment, pt veering too far L/R and almost running into doors with hall ambulation. Pt presenting with impairments listed below, will follow acutely. Recommend HHOT at d/c pending progression.       If plan is discharge home, recommend the following: A little help with walking and/or transfers;A little help with bathing/dressing/bathroom;Assistance with cooking/housework;Assist for transportation;Direct supervision/assist for financial management;Direct supervision/assist for medications management    Functional Status Assessment  Patient has had a recent decline in their functional status and demonstrates the ability to make significant improvements in function in a reasonable and predictable amount of time.  Equipment Recommendations  Tub/shower seat    Recommendations for Other Services PT consult     Precautions / Restrictions Precautions Precautions: Fall Precaution Comments: watch O2 Restrictions Weight Bearing Restrictions Per Provider Order: No      Mobility Bed Mobility Overal bed mobility: Needs Assistance Bed Mobility: Supine to Sit, Sit to Supine      Supine to sit: Min assist Sit to supine: Supervision        Transfers Overall transfer level: Needs assistance Equipment used: Rolling walker (2 wheels) Transfers: Sit to/from Stand Sit to Stand: Min assist                  Balance Overall balance assessment: Needs assistance Sitting-balance support: Feet supported Sitting balance-Leahy Scale: Fair     Standing balance support: During functional activity, Reliant on assistive device for balance Standing balance-Leahy Scale: Poor Standing balance comment: reliant on RW support                           ADL either performed or assessed with clinical judgement   ADL Overall ADL's : Needs assistance/impaired Eating/Feeding: Set up;Sitting   Grooming: Contact guard assist;Sitting   Upper Body Bathing: Minimal assistance;Sitting   Lower Body Bathing: Minimal assistance;Sitting/lateral leans   Upper Body Dressing : Minimal assistance;Sitting   Lower Body Dressing: Minimal assistance;Bed level   Toilet Transfer: Minimal assistance   Toileting- Clothing Manipulation and Hygiene: Total assistance       Functional mobility during ADLs: Minimal assistance;Rolling walker (2 wheels)       Vision   Additional Comments: will further assess, occasionally needing min A with RW to navigate hallway     Perception Perception: Not tested       Praxis Praxis: Not tested       Pertinent Vitals/Pain Pain Assessment Pain Assessment: Faces Pain Score: 4  Faces Pain Scale: Hurts little more Pain Location: L shoulder Pain Descriptors / Indicators: Discomfort Pain Intervention(s): Limited activity within patient's tolerance, Monitored during session, Repositioned     Extremity/Trunk Assessment Upper Extremity Assessment Upper Extremity Assessment:  Generalized weakness (bruising to L shoulder/upper arm, painful to elevate)   Lower Extremity Assessment Lower Extremity Assessment: Defer to PT evaluation    Cervical / Trunk Assessment Cervical / Trunk Assessment: Normal   Communication Communication Communication: No apparent difficulties   Cognition Arousal: Alert Behavior During Therapy: Restless, Lability Overall Cognitive Status: Impaired/Different from baseline Area of Impairment: Orientation, Attention, Memory, Following commands, Safety/judgement, Awareness, Problem solving                 Orientation Level: Disoriented to, Situation, Time Current Attention Level: Focused Memory: Decreased short-term memory Following Commands: Follows one step commands with increased time Safety/Judgement: Decreased awareness of deficits, Decreased awareness of safety Awareness: Anticipatory Problem Solving: Slow processing General Comments: unaware of month, though does recall it is february later on in session, requests to have catheter taken out mulitple times so she can pee     General Comments  HR up to 120s, SpO2 wtih popor read but high 80's/low 90s on RA    Exercises     Shoulder Instructions      Home Living Family/patient expects to be discharged to:: Private residence Living Arrangements: Alone Available Help at Discharge: Friend(s);Available PRN/intermittently Type of Home: Apartment Home Access: Level entry     Home Layout: One level     Bathroom Shower/Tub: Tub/shower unit         Home Equipment: None          Prior Functioning/Environment Prior Level of Function : Driving;Independent/Modified Independent             Mobility Comments: ind ADLs Comments: ind, reports neighbor sometimes assists with going to the store, reports he has a key to her apt        OT Problem List: Decreased strength;Decreased range of motion;Decreased activity tolerance;Impaired balance (sitting and/or standing);Decreased cognition;Decreased safety awareness;Cardiopulmonary status limiting activity      OT Treatment/Interventions: Self-care/ADL  training;Therapeutic exercise;Energy conservation;DME and/or AE instruction;Therapeutic activities;Patient/family education;Balance training;Cognitive remediation/compensation;Visual/perceptual remediation/compensation    OT Goals(Current goals can be found in the care plan section) Acute Rehab OT Goals Patient Stated Goal: none stated OT Goal Formulation: With patient Time For Goal Achievement: 12/09/23 Potential to Achieve Goals: Good ADL Goals Pt Will Perform Upper Body Dressing: sitting;Independently Pt Will Perform Lower Body Dressing: sitting/lateral leans;sit to/from stand;Independently Pt Will Transfer to Toilet: ambulating;regular height toilet;Independently Pt Will Perform Tub/Shower Transfer: Tub transfer;Shower transfer;Independently;ambulating Additional ADL Goal #1: pt will tolerate OOB activity x10 min in prep for ADLs Additional ADL Goal #2: pt will follow 3 step command with min cues in prep for ADLs  OT Frequency: Min 1X/week    Co-evaluation PT/OT/SLP Co-Evaluation/Treatment: Yes Reason for Co-Treatment: Complexity of the patient's impairments (multi-system involvement);For patient/therapist safety;To address functional/ADL transfers   OT goals addressed during session: ADL's and self-care;Strengthening/ROM      AM-PAC OT 6 Clicks Daily Activity     Outcome Measure Help from another person eating meals?: A Little Help from another person taking care of personal grooming?: A Little Help from another person toileting, which includes using toliet, bedpan, or urinal?: A Little Help from another person bathing (including washing, rinsing, drying)?: A Little Help from another person to put on and taking off regular upper body clothing?: A Little Help from another person to put on and taking off regular lower body clothing?: A Little 6 Click Score: 18   End of Session Equipment Utilized During Treatment: Rolling walker (2 wheels) Nurse Communication: Mobility status  (spo2)  Activity Tolerance: Patient tolerated treatment well Patient left: in bed;with call bell/phone within reach;with bed alarm set  OT Visit Diagnosis: Unsteadiness on feet (R26.81);Other abnormalities of gait and mobility (R26.89);Muscle weakness (generalized) (M62.81)                Time: 8557-8494 OT Time Calculation (min): 23 min Charges:  OT General Charges $OT Visit: 1 Visit OT Evaluation $OT Eval Moderate Complexity: 1 Mod  Blaiden Werth K, OTD, OTR/L SecureChat Preferred Acute Rehab [phone]   Laneta POUR Koonce 11/25/2023, 3:35 PM

## 2023-11-25 NOTE — Progress Notes (Addendum)
 NAME:  Alexandra Dorsey, MRN:  [id number], DOB:  Oct 22, 1975, LOS: 2 ADMISSION DATE:  11/23/2023, CONSULTATION DATE:  2/6 REFERRING MD:  Randol, CHIEF COMPLAINT:  AMS, obtunded   History of Present Illness:  Pt is 48 yr female with ETOH abuse, anxiety, gout, HTN, hypothyroidism, ETOH abuse, accidental drug overdose, chronic prolonged QTC and recent hospitalization from 1/28 to 11/17/23 due to electrolyte abnormalities secondary to alcoholism presents to the ED with AMS. Per EMS, patient was attempting to self detox off alcohol for approximately 2 full days and had refused transporting to the hospital on 11/22/23. Patient was transported to the ED on 2/6 with AMS and multiple seizures in route. Patient was given 7.5 mg of IM versed during transport. Upon arrival to ED patient was receiving BVM and required intubation due inability to protect airway. PCCM consulted to admit and further manage patient. Neuro also consulted. HPI obtained from EMR.   Pertinent  Medical History  HTN Hypothyroidism  ETOH abuse  Accidental Drug Overdose  Anxiety  Gout   Significant Hospital Events: Including procedures, antibiotic start and stop dates in addition to other pertinent events   2/6 Admit under PCCM for Status Epilepticus  2/7 extubated, delirious  Interim History / Subjective:   Extubated yesterday.  Delirious overnight.  Pulling out IVs  Objective   Blood pressure (!) 114/99, pulse 91, temperature 98.6 F (37 C), resp. rate 19, height 5' (1.524 m), weight 61.8 kg, SpO2 97%.    Vent Mode: CPAP;PSV FiO2 (%):  [40 %] 40 % Set Rate:  [20 bmp] 20 bmp Vt Set:  [370 mL] 370 mL PEEP:  [5 cmH20] 5 cmH20 Pressure Support:  [5 cmH20-10 cmH20] 5 cmH20 Plateau Pressure:  [14 cmH20] 14 cmH20   Intake/Output Summary (Last 24 hours) at 11/25/2023 0725 Last data filed at 11/25/2023 0600 Gross per 24 hour  Intake 1351.9 ml  Output 2675 ml  Net -1323.1 ml   Filed Weights   11/23/23 1400 11/23/23 1820   Weight: 60 kg 61.8 kg    Examination: Blood pressure (!) 114/99, pulse 91, temperature 98.6 F (37 C), resp. rate 19, height 5' (1.524 m), weight 61.8 kg, SpO2 97%. Gen:      No acute distress, chronically ill appearing HEENT:  EOMI, sclera anicteric Neck:     No masses; no thyromegaly Lungs:    Clear to auscultation bilaterally; normal respiratory effort CV:         Regular rate and rhythm; no murmurs Abd:      + bowel sounds; soft, non-tender; no palpable masses, no distension Ext:    No edema; adequate peripheral perfusion Skin:      Warm and dry; no rash Neuro: Awake, confused  Lab/imaging reviewed Significant for sodium 138, BUN/creatinine 5/0.68 WBC 8.5, hemoglobin 10.3, platelets 236 MRI brain reviewed with findings suggestive of Wernicke's encephalopathy  Resolved Hospital Problem list   Lactic acidosis AKI Hypomagnesemia  Assessment & Plan:  Status epilepticus Acute metabolic encephalopathy Patient actively seizing in arrival - 4 mg of ativan  given at that time (could be secondary to alcohol withdrawal vs severe hypomagnesemia). Also received benzos en route to hospital. CT head negative. Loaded with 3500 mg keppra . Eeg reviewed overnight by neurology and no electrographic seizures seen.  - Neuro consulted, appreciate recs - MRI reviewed with possible Wernicke's.  Continue high-dose IV thiamine  for 500 mg 3 times daily - PRN ativan  for breakthrough seizures - Neuro protective measures: euglycemia, normothermia, normocapnia, optimize electrolytes  -  Off LTM - Folic acid , MVI   Respiratory failure Failure to protect airway -Extubated.  Wean down oxygen as tolerated - Albuterol  PRN with duonebs  AKI > improved - Continue to monitor and optimize electrolytes daily - Continue to monitor urine output - Continue strict I/Os - Continue Adequate renal perfusion  - Avoid nephrotoxic agents   HTN - amlodipine  held due to hypotension   Hypothyroidism Check TSH.  Restart synthyroid  Gout  - holding allopurinol   Anxiety - holding lexapro  and seroquel   Best Practice (right click and Reselect all SmartList Selections daily)   Diet/type: NPO DVT prophylaxis prophylactic heparin   Pressure ulcer(s): pressure ulcer assessment deferred  GI prophylaxis: PPI Lines: N/A Foley:  Yes, and it is still needed Code Status:  full code Last date of multidisciplinary goals of care discussion [pending]  Critical care time: 35 min   The patient is critically ill with multiple organ system failure and requires high complexity decision making for assessment and support, frequent evaluation and titration of therapies, advanced monitoring, review of radiographic studies and interpretation of complex data.   Critical Care Time devoted to patient care services, exclusive of separately billable procedures, described in this note is 35 minutes.   Zamarah Ullmer MD [facility] See Amion for pager  If no response to pager , please call [phone] until 7pm After 7:00 pm call Elink  [phone] 11/25/2023, 7:37 AM

## 2023-11-25 NOTE — Progress Notes (Addendum)
 Phenobarbital  consult note   Patient admitted to ICU on 2/6 for seizures in the setting of ETOH withdrawal.   Extubated on 2/7  Plan: Phenobarbital  260 mg IV x1 followed by PO taper:  97.2 mg PO q8h x 6 doses  64.8 mg PO q8h x 6 doses  32.4 mg PO q8h x 6 doses   PRN 1 mg IV ativan  for agitation / seizures   Pharmacy will sign off, please reconsult as needed   Rankin Sams, PharmD, BCPS, BCCCP Clinical Pharmacist

## 2023-11-26 DIAGNOSIS — I10 Essential (primary) hypertension: Secondary | ICD-10-CM | POA: Diagnosis not present

## 2023-11-26 DIAGNOSIS — G9341 Metabolic encephalopathy: Secondary | ICD-10-CM | POA: Diagnosis not present

## 2023-11-26 DIAGNOSIS — E039 Hypothyroidism, unspecified: Secondary | ICD-10-CM | POA: Diagnosis not present

## 2023-11-26 LAB — COMPREHENSIVE METABOLIC PANEL
ALT: 13 U/L (ref 0–44)
AST: 18 U/L (ref 15–41)
Albumin: 2.9 g/dL — ABNORMAL LOW (ref 3.5–5.0)
Alkaline Phosphatase: 56 U/L (ref 38–126)
Anion gap: 12 (ref 5–15)
BUN: <5 mg/dL — ABNORMAL LOW (ref 6–20)
CO2: 27 mmol/L (ref 22–32)
Calcium: 8.8 mg/dL — ABNORMAL LOW (ref 8.9–10.3)
Chloride: 95 mmol/L — ABNORMAL LOW (ref 98–111)
Creatinine, Ser: 0.69 mg/dL (ref 0.44–1.00)
GFR, Estimated: >60 mL/min (ref >60)
Glucose, Bld: 88 mg/dL (ref 70–99)
Potassium: 3.5 mmol/L (ref 3.5–5.1)
Sodium: 134 mmol/L — ABNORMAL LOW (ref 135–145)
Total Bilirubin: 0.7 mg/dL (ref 0.0–1.2)
Total Protein: 5.8 g/dL — ABNORMAL LOW (ref 6.5–8.1)

## 2023-11-26 LAB — CBC
HCT: 30.7 % — ABNORMAL LOW (ref 36.0–46.0)
Hemoglobin: 10.8 g/dL — ABNORMAL LOW (ref 12.0–15.0)
MCH: 34.6 pg — ABNORMAL HIGH (ref 26.0–34.0)
MCHC: 35.2 g/dL (ref 30.0–36.0)
MCV: 98.4 fL (ref 80.0–100.0)
Platelets: 292 10*3/uL (ref 150–400)
RBC: 3.12 MIL/uL — ABNORMAL LOW (ref 3.87–5.11)
RDW: 13.2 % (ref 11.5–15.5)
WBC: 10.6 10*3/uL — ABNORMAL HIGH (ref 4.0–10.5)
nRBC: 0 % (ref 0.0–0.2)

## 2023-11-26 LAB — GLUCOSE, CAPILLARY
Glucose-Capillary: 89 mg/dL (ref 70–99)
Glucose-Capillary: 106 mg/dL — ABNORMAL HIGH (ref 70–99)
Glucose-Capillary: 127 mg/dL — ABNORMAL HIGH (ref 70–99)
Glucose-Capillary: 88 mg/dL (ref 70–99)

## 2023-11-26 LAB — TSH: TSH: 5.962 u[IU]/mL — ABNORMAL HIGH (ref 0.350–4.500)

## 2023-11-26 MED ORDER — ORAL CARE MOUTH RINSE: 15.0000 mL | OROMUCOSAL | Status: DC | PRN

## 2023-11-26 MED ORDER — AMLODIPINE BESYLATE 5 MG PO TABS
5.0000 mg | ORAL_TABLET | Freq: Every day | ORAL | Status: DC
  Administered 2023-11-26 – 2023-11-28 (×3): 5 mg via ORAL
  Filled 2023-11-26 (×3): qty 1

## 2023-11-26 MED ORDER — POTASSIUM CHLORIDE CRYS ER 20 MEQ PO TBCR
40.0000 meq | EXTENDED_RELEASE_TABLET | Freq: Once | ORAL | Status: AC
  Administered 2023-11-26: 40 meq via ORAL
  Filled 2023-11-26: qty 2

## 2023-11-26 MED ORDER — QUETIAPINE FUMARATE 100 MG PO TABS
200.0000 mg | ORAL_TABLET | Freq: Every day | ORAL | Status: DC
  Administered 2023-11-26 – 2023-11-28 (×3): 200 mg via ORAL
  Filled 2023-11-26 (×3): qty 2

## 2023-11-26 MED ORDER — PHENOBARBITAL SODIUM 65 MG/ML IJ SOLN: 65.0000 mg | Freq: Once | INTRAMUSCULAR | Status: DC

## 2023-11-26 MED ORDER — THIAMINE MONONITRATE 100 MG PO TABS
100.0000 mg | ORAL_TABLET | Freq: Every day | ORAL | Status: DC
  Administered 2023-11-27 – 2023-11-29 (×3): 100 mg via ORAL
  Filled 2023-11-26 (×3): qty 1

## 2023-11-26 MED ORDER — DEXMEDETOMIDINE HCL IN NACL 400 MCG/100ML IV SOLN
0.0000 ug/kg/h | INTRAVENOUS | Status: DC
  Administered 2023-11-26: 0.9 ug/kg/h via INTRAVENOUS
  Administered 2023-11-26: 0.4 ug/kg/h via INTRAVENOUS
  Filled 2023-11-26 (×2): qty 100

## 2023-11-26 MED ORDER — ESCITALOPRAM OXALATE 10 MG PO TABS
20.0000 mg | ORAL_TABLET | Freq: Every day | ORAL | Status: DC
  Administered 2023-11-26 – 2023-11-29 (×4): 20 mg via ORAL
  Filled 2023-11-26 (×4): qty 2

## 2023-11-26 MED ORDER — TRAZODONE HCL 50 MG PO TABS
50.0000 mg | ORAL_TABLET | Freq: Every day | ORAL | Status: DC
  Administered 2023-11-26 – 2023-11-28 (×3): 50 mg via ORAL
  Filled 2023-11-26 (×3): qty 1

## 2023-11-26 MED ORDER — POLYETHYLENE GLYCOL 3350 17 G PO PACK
17.0000 g | PACK | Freq: Every day | ORAL | Status: DC
  Administered 2023-11-28 – 2023-11-29 (×2): 17 g via ORAL
  Filled 2023-11-26 (×2): qty 1

## 2023-11-26 MED ORDER — PHENOBARBITAL SODIUM 130 MG/ML IJ SOLN
130.0000 mg | Freq: Once | INTRAMUSCULAR | Status: AC
  Administered 2023-11-26: 130 mg via INTRAVENOUS
  Filled 2023-11-26: qty 1

## 2023-11-26 NOTE — Progress Notes (Signed)
 [facility] ADULT ICU REPLACEMENT PROTOCOL   The patient does apply for the [facility] Adult ICU Electrolyte Replacment Protocol based on the criteria listed below:   1.Exclusion criteria: TCTS, ECMO, Dialysis, and Myasthenia Gravis patients 2. Is GFR >/= 30 ml/min? Yes.    Patient's GFR today is >60 3. Is SCr </= 2? Yes.   Patient's SCr is 0.69 mg/dL 4. Did SCr increase >/= 0.5 in 24 hours? No. 5.Pt's weight >40kg  Yes.   6. Abnormal electrolyte(s): K+ 3.5  7. Electrolytes replaced per protocol 8.  Call MD STAT for K+ </= 2.5, Phos </= 1, or Mag </= 1 Physician:  Dr. Haze Rummer, Recardo ORN 11/26/2023 4:59 AM

## 2023-11-26 NOTE — Progress Notes (Signed)
 NAME:  Alexandra Dorsey, MRN:  [id number], DOB:  02-27-1976, LOS: 3 ADMISSION DATE:  11/23/2023, CONSULTATION DATE:  2/6 REFERRING MD:  Randol, CHIEF COMPLAINT:  AMS, obtunded   History of Present Illness:  Pt is 48 yr female with ETOH abuse, anxiety, gout, HTN, hypothyroidism, ETOH abuse, accidental drug overdose, chronic prolonged QTC and recent hospitalization from 1/28 to 11/17/23 due to electrolyte abnormalities secondary to alcoholism presents to the ED with AMS. Per EMS, patient was attempting to self detox off alcohol for approximately 2 full days and had refused transporting to the hospital on 11/22/23. Patient was transported to the ED on 2/6 with AMS and multiple seizures in route. Patient was given 7.5 mg of IM versed during transport. Upon arrival to ED patient was receiving BVM and required intubation due inability to protect airway. PCCM consulted to admit and further manage patient. Neuro also consulted. HPI obtained from EMR.   Pertinent  Medical History  HTN Hypothyroidism  ETOH abuse  Accidental Drug Overdose  Anxiety  Gout   Significant Hospital Events: Including procedures, antibiotic start and stop dates in addition to other pertinent events   2/6 Admit under PCCM for Status Epilepticus  2/7 extubated, delirious  Interim History / Subjective:   Agitated, delirious overnight.  Started on Precedex  drip Told the nurse that she wants to kill herself  Objective   Blood pressure (!) 134/111, pulse 65, temperature 98.5 F (36.9 C), temperature source Axillary, resp. rate 14, height 5' (1.524 m), weight 67 kg, SpO2 99%.        Intake/Output Summary (Last 24 hours) at 11/26/2023 0957 Last data filed at 11/26/2023 0900 Gross per 24 hour  Intake 306.54 ml  Output 2990 ml  Net -2683.46 ml   Filed Weights   11/23/23 1400 11/23/23 1820 11/26/23 0400  Weight: 60 kg 61.8 kg 67 kg    Examination: Gen:      No acute distress HEENT:  EOMI, sclera anicteric Neck:     No  masses; no thyromegaly Lungs:    Clear to auscultation bilaterally; normal respiratory effort CV:         Regular rate and rhythm; no murmurs Abd:      + bowel sounds; soft, non-tender; no palpable masses, no distension Ext:    No edema; adequate peripheral perfusion Skin:      Warm and dry; no rash Neuro: Awake, answers questions appropriately  Lab/imaging reviewed Sodium 134, BUN/creatinine 5/0.69 WBC 10.6, hemoglobin 10.8, platelets 292 MRI brain reviewed with findings suggestive of Wernicke's encephalopathy  Resolved Hospital Problem list   Lactic acidosis AKI Hypomagnesemia  Assessment & Plan:  Status epilepticus Acute metabolic encephalopathy, Wernicke's encephalopathy Patient actively seizing in arrival - 4 mg of ativan  given at that time (could be secondary to alcohol withdrawal vs severe hypomagnesemia). Also received benzos en route to hospital. CT head negative. Loaded with 3500 mg keppra . Eeg reviewed overnight by neurology and no electrographic seizures seen.  - Neuro signed off - MRI reviewed with possible Wernicke's.  Continue high-dose IV thiamine  for 500 mg tid for 5 days - PRN ativan  for breakthrough seizures - Neuro protective measures: euglycemia, normothermia, normocapnia, optimize electrolytes  - Off LTM - Folic acid , MVI   Respiratory failure Failure to protect airway -Extubated.  Wean down oxygen as tolerated - Albuterol  PRN with duonebs  AKI > improved - Continue to monitor and optimize electrolytes daily - Continue to monitor urine output - Continue strict I/Os - Continue Adequate renal  perfusion  - Avoid nephrotoxic agents   HTN Restart amlodipine   Hypothyroidism TSH elevated. Restarted synthyroid  Gout Holding allopurinol   Anxiety Restart home seroquel , trazadone and lexapro  Psychiatry consult next week when she is more oriented for suicidal ideation.  Continue Government Social Research Officer (right click and Reselect all SmartList Selections  daily)   Diet/type: NPO DVT prophylaxis prophylactic heparin   Pressure ulcer(s): pressure ulcer assessment deferred  GI prophylaxis: PPI Lines: N/A Foley:  Yes, and it is still needed Code Status:  full code Last date of multidisciplinary goals of care discussion [pending]  Critical care time: 35 min   The patient is critically ill with multiple organ system failure and requires high complexity decision making for assessment and support, frequent evaluation and titration of therapies, advanced monitoring, review of radiographic studies and interpretation of complex data.   Critical Care Time devoted to patient care services, exclusive of separately billable procedures, described in this note is 35 minutes.   Annelie Boak MD [facility] See Amion for pager  If no response to pager , please call [phone] until 7pm After 7:00 pm call Elink  [phone] 11/26/2023, 9:57 AM

## 2023-11-26 NOTE — Progress Notes (Signed)
 This RN responded to bed alarm and immediately entered patients room.   Upon entering the room, the patient was observed to be out of bed, standing to the left of the bed, holding on to the bed rail, as the patient was stating that she needed to go to the bathroom.   This RN and second RN returned patient to bed, and notified E-link.   At the time of occurrence, call bell was within reach, bed alarm was on to the positioning setting, bed rails raised, non-slip socks on and fall matts in placed.   This RN removed foley as patient was attempting to self remove foley.

## 2023-11-26 NOTE — Progress Notes (Signed)
 This RN was performing hourly rounding, and exited the patients room.   While this patient was charting outside the room, this RN heard the patient yell, "I want to kill myself". This RN re-entered room and performed safety check.    E-link notified.

## 2023-11-26 NOTE — Progress Notes (Addendum)
 eLink Physician-Brief Progress Note Patient Name: Alexandra Dorsey DOB: 07-28-1976 MRN: [id number]   Date of Service  11/26/2023  HPI/Events of Note  48 y/o F presenting to ED 2/6 with seizures, attempting to self detox, CT head negative MRI with nonspecific findings but can be seen in setting of Wernicke's encephalopathy. Intubated 2/6-2/7.   Despite phenobarbital  and Ativan , the patient continues to try to get out of bed and continues to be agitated.  eICU Interventions  Will add additional dose of phenobarbital  now   0127 -despite additional phenobarbital , patient is trying to get out of bed and persistently agitated.  We will add on adjunctive Precedex .  DC Foley.  Add Posey.  0159 -  patient said  I just want to kill myself   On Friday afternoon patient said this outloud and the her father says she is frequently saying this.  Add.  Will likely need psychiatry consult prior to discharge.  Intervention Category Minor Interventions: Agitation / anxiety - evaluation and management  [name] 11/26/2023, 12:26 AM

## 2023-11-27 ENCOUNTER — Inpatient Hospital Stay (HOSPITAL_COMMUNITY): Payer: MEDICAID

## 2023-11-27 DIAGNOSIS — J9601 Acute respiratory failure with hypoxia: Secondary | ICD-10-CM | POA: Diagnosis not present

## 2023-11-27 DIAGNOSIS — F10230 Alcohol dependence with withdrawal, uncomplicated: Secondary | ICD-10-CM | POA: Diagnosis not present

## 2023-11-27 MED ORDER — PANTOPRAZOLE SODIUM 40 MG PO TBEC
40.0000 mg | DELAYED_RELEASE_TABLET | Freq: Every day | ORAL | Status: DC
  Administered 2023-11-27 – 2023-11-29 (×3): 40 mg via ORAL
  Filled 2023-11-27 (×3): qty 1

## 2023-11-27 MED ORDER — PHENOL 1.4 % MT LIQD
1.0000 | OROMUCOSAL | Status: DC | PRN
  Administered 2023-11-28: 1 via OROMUCOSAL
  Filled 2023-11-27: qty 177

## 2023-11-27 MED ORDER — DICLOFENAC SODIUM 1 % EX GEL
2.0000 g | Freq: Four times a day (QID) | CUTANEOUS | Status: DC
  Administered 2023-11-27 – 2023-11-29 (×8): 2 g via TOPICAL
  Filled 2023-11-27 (×2): qty 100

## 2023-11-27 NOTE — TOC Progression Note (Addendum)
 Transition of Care [place] [facility]) - Progression Note    Patient Details  Name: LAPORSHE CATOE MRN: [id number] Date of Birth: [birth year]  [facility] CM/SW Contact  Jeani Mill, RN Phone Number: 11/27/2023, 1:42 PM  Clinical Narrative:     Patient states she does have insurance but doesn't have the card.  PCP-confirmed.   62 Spoke to patient's father who states patient is not safe to discharge home alone. Father states patient is very forgetful. Sent FC secure chat to review patient for potential medicaid.   TOC following.   Expected Discharge Plan: Home w Home Health Services Barriers to Discharge: Continued Medical Work up  Expected Discharge Plan and Services In-house Referral: Clinical Social Work     Living arrangements for the past 2 months: Apartment                                       Social Determinants of Health (SDOH) Interventions SDOH Screenings   Food Insecurity: Patient Unable To Answer (11/25/2023)  Housing: Patient Unable To Answer (11/25/2023)  Transportation Needs: Unknown (11/25/2023)  Utilities: Patient Unable To Answer (11/25/2023)  Tobacco Use: Medium Risk (11/23/2023)    Readmission Risk Interventions    11/16/2023   10:45 AM  Readmission Risk Prevention Plan  Transportation Screening Complete  HRI or Home Care Consult Complete  Social Work Consult for Recovery Care Planning/Counseling Complete  Palliative Care Screening Not Applicable  Medication Review Oceanographer) Complete

## 2023-11-27 NOTE — Progress Notes (Signed)
 Orthopedic Tech Progress Note Patient Details:  Alexandra Dorsey [birth year] [id number]  Ortho Devices Type of Ortho Device: Shoulder immobilizer Ortho Device/Splint Location: LUE Ortho Device/Splint Interventions: Ordered, Application, Adjustment   Post Interventions Patient Tolerated: Well Instructions Provided: Care of device  Kermitt Pedlar 11/27/2023, 5:32 PM

## 2023-11-27 NOTE — Progress Notes (Signed)
 eLink Physician-Brief Progress Note Patient Name: Alexandra Dorsey DOB: 03/03/1976 MRN: [id number]   Date of Service  11/27/2023  HPI/Events of Note  Sore throat  eICU Interventions  Chloraseptic spray PRN     Intervention Category Minor Interventions: Routine modifications to care plan (e.g. PRN medications for pain, fever)  Alexandra Dorsey 11/27/2023, 3:53 AM

## 2023-11-27 NOTE — TOC Initial Note (Signed)
 Transition of Care [facility]) - Initial/Assessment Note    Patient Details  Name: Alexandra Dorsey MRN: [id number] Date of Birth: 1976-04-10  Transition of Care [facility]) CM/SW Contact:    Juliane Och, LCSW Phone Number: 11/27/2023, 12:19 PM  Clinical Narrative:                  12:19 PM CSW introduced herself and role to patient at bedside. CSW informed patient of TOC consult (substance use) and offered resources. Patient declined CSW offer.  Expected Discharge Plan: Home w Home Health Services Barriers to Discharge: Continued Medical Work up   Patient Goals and CMS Choice Patient states their goals for this hospitalization and ongoing recovery are:: return home          Expected Discharge Plan and Services In-house Referral: Clinical Social Work     Living arrangements for the past 2 months: Apartment                                      Prior Living Arrangements/Services Living arrangements for the past 2 months: Apartment Lives with:: Self Patient language and need for interpreter reviewed:: Yes Do you feel safe going back to the place where you live?: Yes      Need for Family Participation in Patient Care: Yes (Comment) Care giver support system in place?: Yes (comment)   Criminal Activity/Legal Involvement Pertinent to Current Situation/Hospitalization: No - Comment as needed  Activities of Daily Living      Permission Sought/Granted Permission sought to share information with : Family Supports Permission granted to share information with : No (Contact information on chart)  Share Information with NAME: Alexandra Dorsey     Permission granted to share info w Relationship: Father  Permission granted to share info w Contact Information: [phone]  Emotional Assessment Appearance:: Appears stated age Attitude/Demeanor/Rapport: Engaged Affect (typically observed): Appropriate, Stable Orientation: : Oriented to Self, Oriented to Place, Oriented to   Time, Oriented to Situation Alcohol / Substance Use: Alcohol Use Psych Involvement: No (comment)  Admission diagnosis:  Seizure (HCC) [R56.9] Seizures (HCC) [R56.9] AKI (acute kidney injury) (HCC) [N17.9] Alcohol withdrawal syndrome, with delirium (HCC) [F10.931] Patient Active Problem List   Diagnosis Date Noted   Seizures (HCC) 11/23/2023   AKI (acute kidney injury) (HCC) 11/14/2023   Cough 09/12/2023   Drug overdose, accidental or unintentional, initial encounter 09/11/2023   Hyponatremia 09/11/2023   Chronic alcohol use 09/11/2023   Tetrahydrocannabinol (THC) use disorder, mild, abuse 09/11/2023   Chronic anemia 09/11/2023   Leukocytosis 09/11/2023   Generalized anxiety disorder 09/11/2023   Hypothyroidism 09/11/2023   QT prolongation 03/20/2022   Fall at home, initial encounter 03/20/2022   Anxiety    Hypocalcemia 04/27/2021   Hypophosphatemia    Gastroenteritis    Nausea & vomiting 04/25/2021   Hypokalemia 04/25/2021   Hypomagnesemia 04/25/2021   Dehydration 04/25/2021   Acute lower UTI 04/25/2021   Transaminitis 04/25/2021   Nausea, vomiting and diarrhea    Altered mental status 06/06/2017   Alcohol dependence with withdrawal with complication (HCC)    Lactic acidosis    Chills 05/29/2017   Non-intractable vomiting with nausea 05/29/2017   Acute pharyngitis 05/29/2017   Sore throat 05/29/2017   Essential hypertension 11/13/2016   Panic attacks 11/13/2016   PCP:  Sinda Duel, PA Pharmacy:   [id number] - [place], [place] - [address] [place] [address] Phone: [phone] Fax: [phone]  [facility] [address] [place] [address] Phone: [phone] Fax: [phone]  [facility] [address] [place] [address] Phone: [phone] Fax: [phone]     Social Drivers of Health (SDOH) Social History: SDOH  Screenings   Food Insecurity: Patient Unable To Answer (11/25/2023)  Housing: Patient Unable To Answer (11/25/2023)  Transportation Needs: Unknown (11/25/2023)  Utilities: Patient Unable To Answer (11/25/2023)  Tobacco Use: Medium Risk (11/23/2023)   SDOH Interventions:     Readmission Risk Interventions    11/16/2023   10:45 AM  Readmission Risk Prevention Plan  Transportation Screening Complete  HRI or Home Care Consult Complete  Social Work Consult for Recovery Care Planning/Counseling Complete  Palliative Care Screening Not Applicable  Medication Review [employer]) Complete

## 2023-11-27 NOTE — Progress Notes (Signed)
 Left shoulder xray demonstrated mildly displaced fracture of lateral humeral head. Discussed with Starr Eddy, PA-C for orthopedics consult for further evaluation and management. Appreciate their assistance and recs.

## 2023-11-27 NOTE — Consult Note (Signed)
Reason for Consult:Left humerus fx Referring Physician: Melody Comas Time called: 1447 Time at bedside: 1532   Alexandra Dorsey is an 48 y.o. female.  HPI: Del was admitted 4d ago with EtOH withdrawal complications. About 4w ago she was stepping out of the tub and slipped and fell and hurt her left shoulder. She make an appt to see orthopedics but was unable to get in before this present admission. X-rays here showed a humeral head fx and orthopedic surgery was consulted. She is RHD and is unemployed.  Past Medical History:  Diagnosis Date   Alcohol abuse    Anxiety    Gout    Hypertension     History reviewed. No pertinent surgical history.  Family History  Problem Relation Age of Onset   Hypertension Mother    Hypertension Father    Stroke Maternal Grandfather     Social History:  reports that she has quit smoking. She has never used smokeless tobacco. She reports current alcohol use of about 10.0 standard drinks of alcohol per week. She reports that she does not use drugs.  Allergies:  Allergies  Allergen Reactions   Lisinopril Cough   Norvasc [Amlodipine Besylate] Other (See Comments)    Feet hurts    Medications: I have reviewed the patient's current medications.  Results for orders placed or performed during the hospital encounter of 11/23/23 (from the past 48 hours)  Glucose, capillary     Status: None   Collection Time: 11/25/23  3:51 PM  Result Value Ref Range   Glucose-Capillary 81 70 - 99 mg/dL    Comment: Glucose reference range applies only to samples taken after fasting for at least 8 hours.  Glucose, capillary     Status: None   Collection Time: 11/25/23  7:12 PM  Result Value Ref Range   Glucose-Capillary 76 70 - 99 mg/dL    Comment: Glucose reference range applies only to samples taken after fasting for at least 8 hours.  Glucose, capillary     Status: None   Collection Time: 11/25/23 11:01 PM  Result Value Ref Range   Glucose-Capillary 76  70 - 99 mg/dL    Comment: Glucose reference range applies only to samples taken after fasting for at least 8 hours.  TSH     Status: Abnormal   Collection Time: 11/26/23  3:32 AM  Result Value Ref Range   TSH 5.962 (H) 0.350 - 4.500 uIU/mL    Comment: Performed by a 3rd Generation assay with a functional sensitivity of <=0.01 uIU/mL. Performed at [facility], [address], [place], [place] [address]   CBC     Status: Abnormal   Collection Time: 11/26/23  3:32 AM  Result Value Ref Range   WBC 10.6 (H) 4.0 - 10.5 K/uL   RBC 3.12 (L) 3.87 - 5.11 MIL/uL   Hemoglobin 10.8 (L) 12.0 - 15.0 g/dL   HCT 11.9 (L) 14.7 - 82.9 %   MCV 98.4 80.0 - 100.0 fL   MCH 34.6 (H) 26.0 - 34.0 pg   MCHC 35.2 30.0 - 36.0 g/dL   RDW 56.2 13.0 - 86.5 %   Platelets 292 150 - 400 K/uL   nRBC 0.0 0.0 - 0.2 %    Comment: Performed at [facility], [address] [place], [place] [address]  Comprehensive metabolic panel     Status: Abnormal   Collection Time: 11/26/23  3:32 AM  Result Value Ref Range   Sodium 134 (  L) 135 - 145 mmol/L   Potassium 3.5 3.5 - 5.1 mmol/L   Chloride 95 (L) 98 - 111 mmol/L   CO2 27 22 - 32 mmol/L   Glucose, Bld 88 70 - 99 mg/dL    Comment: Glucose reference range applies only to samples taken after fasting for at least 8 hours.   BUN <5 (L) 6 - 20 mg/dL   Creatinine, Ser 1.47 0.44 - 1.00 mg/dL   Calcium 8.8 (L) 8.9 - 10.3 mg/dL   Total Protein 5.8 (L) 6.5 - 8.1 g/dL   Albumin 2.9 (L) 3.5 - 5.0 g/dL   AST 18 15 - 41 U/L   ALT 13 0 - 44 U/L   Alkaline Phosphatase 56 38 - 126 U/L   Total Bilirubin 0.7 0.0 - 1.2 mg/dL   GFR, Estimated >82 >95 mL/min    Comment: (NOTE) Calculated using the CKD-EPI Creatinine Equation (2021)    Anion gap 12 5 - 15    Comment: Performed at [facility], [address], [place], [place] [address]  Glucose, capillary     Status: None   Collection Time: 11/26/23  3:32 AM  Result Value Ref Range   Glucose-Capillary 89 70 - 99  mg/dL    Comment: Glucose reference range applies only to samples taken after fasting for at least 8 hours.  Glucose, capillary     Status: Abnormal   Collection Time: 11/26/23  7:19 AM  Result Value Ref Range   Glucose-Capillary 106 (H) 70 - 99 mg/dL    Comment: Glucose reference range applies only to samples taken after fasting for at least 8 hours.  Glucose, capillary     Status: Abnormal   Collection Time: 11/26/23 11:11 AM  Result Value Ref Range   Glucose-Capillary 127 (H) 70 - 99 mg/dL    Comment: Glucose reference range applies only to samples taken after fasting for at least 8 hours.  Glucose, capillary     Status: None   Collection Time: 11/26/23  3:10 PM  Result Value Ref Range   Glucose-Capillary 88 70 - 99 mg/dL    Comment: Glucose reference range applies only to samples taken after fasting for at least 8 hours.    DG Shoulder Left Port Result Date: 11/27/2023 CLINICAL DATA:  Left shoulder pain. EXAM: LEFT SHOULDER COMPARISON:  Radiograph 10/14/2023 FINDINGS: Mildly displaced fracture involves the lateral humeral head. This is in the region of the rotator cuff insertion. No frank glenohumeral dislocation. The acromioclavicular joint is congruent with unchanged mild degenerative spurring. IMPRESSION: Mildly displaced fracture involving the lateral humeral head. Electronically Signed   By: Narda Rutherford M.D.   On: 11/27/2023 13:47    Review of Systems  HENT:  Negative for ear discharge, ear pain, hearing loss and tinnitus.   Eyes:  Negative for photophobia and pain.  Respiratory:  Negative for cough and shortness of breath.   Cardiovascular:  Negative for chest pain.  Gastrointestinal:  Negative for abdominal pain, nausea and vomiting.  Genitourinary:  Negative for dysuria, flank pain, frequency and urgency.  Musculoskeletal:  Positive for arthralgias (Left shoulder). Negative for back pain, myalgias and neck pain.  Neurological:  Negative for dizziness and headaches.   Hematological:  Does not bruise/bleed easily.  Psychiatric/Behavioral:  The patient is not nervous/anxious.    Blood pressure 107/72, pulse 91, temperature 100.2 F (37.9 C), temperature source Oral, resp. rate 17, height 5' (1.524 m), weight 67 kg, SpO2 94%. Physical Exam Constitutional:  General: She is not in acute distress.    Appearance: She is well-developed. She is not diaphoretic.  HENT:     Head: Normocephalic and atraumatic.  Eyes:     General: No scleral icterus.       Right eye: No discharge.        Left eye: No discharge.     Conjunctiva/sclera: Conjunctivae normal.  Cardiovascular:     Rate and Rhythm: Normal rate and regular rhythm.  Pulmonary:     Effort: Pulmonary effort is normal. No respiratory distress.  Musculoskeletal:     Cervical back: Normal range of motion.     Comments: Left shoulder, elbow, wrist, digits- no skin wounds, mild TTP lateral shoulder, no instability, no blocks to motion  Sens  Ax/R/M/U intact  Mot   Ax/ R/ PIN/ M/ AIN/ U intact  Rad 2+  Skin:    General: Skin is warm and dry.  Neurological:     Mental Status: She is alert.  Psychiatric:        Mood and Affect: Mood normal.        Behavior: Behavior normal.     Assessment/Plan: Left humerus fx -- Will get MRI to better characterize injury. For now sling and NWB.    Freeman Caldron, PA-C Orthopedic Surgery [phone] 11/27/2023, 3:39 PM

## 2023-11-27 NOTE — Plan of Care (Signed)
  Problem: Education: Goal: Expressions of having a comfortable level of knowledge regarding the disease process will increase Outcome: Progressing   Problem: Coping: Goal: Ability to adjust to condition or change in health will improve Outcome: Progressing Goal: Ability to identify appropriate support needs will improve Outcome: Progressing   Problem: Health Behavior/Discharge Planning: Goal: Compliance with prescribed medication regimen will improve Outcome: Progressing   

## 2023-11-27 NOTE — Progress Notes (Addendum)
 NAME:  Alexandra Dorsey, MRN:  [id number], DOB:  [birth year], LOS: 4 ADMISSION DATE:  11/23/2023, CONSULTATION DATE:  2/6 REFERRING MD:  Monnie Anthony, CHIEF COMPLAINT:  AMS, obtunded   History of Present Illness:  Pt is 48 yr female with ETOH abuse, anxiety, gout, HTN, hypothyroidism, ETOH abuse, accidental drug overdose, chronic prolonged QTC and recent hospitalization from 1/28 to 11/17/23 due to electrolyte abnormalities secondary to alcoholism presents to the ED with AMS. Per EMS, patient was attempting to self detox off alcohol for approximately 2 full days and had refused transporting to the hospital on 11/22/23. Patient was transported to the ED on 2/6 with AMS and multiple seizures in route. Patient was given 7.5 mg of IM versed during transport. Upon arrival to ED patient was receiving BVM and required intubation due inability to protect airway. PCCM consulted to admit and further manage patient. Neuro also consulted. HPI obtained from EMR.   Pertinent  Medical History  HTN Hypothyroidism  ETOH abuse  Accidental Drug Overdose  Anxiety  Gout   Significant Hospital Events: Including procedures, antibiotic start and stop dates in addition to other pertinent events   2/6 Admit under PCCM for Status Epilepticus  2/7 extubated, delirious  Interim History / Subjective:  Awake and alert. Denies any SI/HI at this time.   Objective   Blood pressure 105/76, pulse 87, temperature 98.2 F (36.8 C), temperature source Oral, resp. rate 15, height 5' (1.524 m), weight 67 kg, SpO2 98%.        Intake/Output Summary (Last 24 hours) at 11/27/2023 0753 Last data filed at 11/27/2023 0230 Gross per 24 hour  Intake 476.26 ml  Output 870 ml  Net -393.74 ml   Filed Weights   11/23/23 1400 11/23/23 1820 11/26/23 0400  Weight: 60 kg 61.8 kg 67 kg    Examination: Gen:      No acute distress HEENT:  EOMI, sclera anicteric Lungs:    Clear to auscultation bilaterally; normal respiratory effort CV:          Regular rate and rhythm; no murmurs Abd:      + bowel sounds; soft, non-tender, no distension Ext:    No edema; adequate peripheral perfusion Neuro:  Awake, answers questions appropriately  Lab/imaging reviewed Sodium 134, BUN/creatinine 5/0.69 WBC 10.6, hemoglobin 10.8, platelets 292 MRI brain reviewed with findings suggestive of Wernicke's encephalopathy  Resolved Hospital Problem list   Lactic acidosis AKI Hypomagnesemia  Assessment & Plan:  Status epilepticus Acute metabolic encephalopathy, Wernicke's encephalopathy Patient actively seizing in arrival - 4 mg of ativan  given at that time (could be secondary to alcohol withdrawal vs severe hypomagnesemia). Also received benzos en route to hospital. CT head negative. Loaded with 3500 mg keppra . Eeg reviewed overnight by neurology and no electrographic seizures seen.  - Neuro signed off - MRI reviewed with possible Wernicke's.  Completed high-dose IV thiamine  for 500 mg tid for 5 days, now on thiamine  100 mg daily - PRN ativan  for breakthrough seizures - Neuro protective measures: euglycemia, normothermia, normocapnia, optimize electrolytes  - Off LTM - Folic acid , MVI  - If remains hemodynamically stable and more alert, stable to transfer to floor  Respiratory failure, improved Failure to protect airway Extubated on 2/7. Now on 2L [place]. -  Wean down oxygen as tolerated - Albuterol  PRN with duonebs  AKI , improved - Continue to monitor and optimize electrolytes daily - Continue to monitor urine output - Continue strict I/Os - Continue Adequate renal perfusion  - Avoid  nephrotoxic agents   HTN Restart amlodipine  5 mg  Hypothyroidism TSH elevated. Restarted synthyroid 25 mcg daily   Gout Holding allopurinol   Left Shoulder Pain Reports hx of fall and injury to left shoulder a few months ago. Has not been seen and had doctor's appt today. Limited ROM due to pain. -Left shoulder xray  -Tylenol  PRN  Anxiety Hx of  SI Denies any thoughts of harming herself at this time.  - Restart home seroquel , trazadone and lexapro  - Consider Psychiatry consult if further suicidal ideation.   - Paediatric nurse (right click and "Reselect all SmartList Selections" daily)   Diet/type: Regular consistency (see orders) DVT prophylaxis prophylactic heparin   Pressure ulcer(s): pressure ulcer assessment deferred  GI prophylaxis: PPI Lines: N/A Foley:  N/A Code Status:  full code Last date of multidisciplinary goals of care discussion [pending]  Critical care time:

## 2023-11-28 DIAGNOSIS — R569 Unspecified convulsions: Secondary | ICD-10-CM | POA: Diagnosis not present

## 2023-11-28 LAB — BASIC METABOLIC PANEL
Anion gap: 8 (ref 5–15)
BUN: <5 mg/dL — ABNORMAL LOW (ref 6–20)
CO2: 34 mmol/L — ABNORMAL HIGH (ref 22–32)
Calcium: 8.5 mg/dL — ABNORMAL LOW (ref 8.9–10.3)
Chloride: 93 mmol/L — ABNORMAL LOW (ref 98–111)
Creatinine, Ser: 0.8 mg/dL (ref 0.44–1.00)
GFR, Estimated: >60 mL/min (ref >60)
Glucose, Bld: 81 mg/dL (ref 70–99)
Potassium: 3.3 mmol/L — ABNORMAL LOW (ref 3.5–5.1)
Sodium: 135 mmol/L (ref 135–145)

## 2023-11-28 LAB — CBC
HCT: 35.2 % — ABNORMAL LOW (ref 36.0–46.0)
Hemoglobin: 12.1 g/dL (ref 12.0–15.0)
MCH: 34.5 pg — ABNORMAL HIGH (ref 26.0–34.0)
MCHC: 34.4 g/dL (ref 30.0–36.0)
MCV: 100.3 fL — ABNORMAL HIGH (ref 80.0–100.0)
Platelets: 380 10*3/uL (ref 150–400)
RBC: 3.51 MIL/uL — ABNORMAL LOW (ref 3.87–5.11)
RDW: 13.4 % (ref 11.5–15.5)
WBC: 11.4 10*3/uL — ABNORMAL HIGH (ref 4.0–10.5)
nRBC: 0 % (ref 0.0–0.2)

## 2023-11-28 LAB — CULTURE, BLOOD (ROUTINE X 2)
Culture: NO GROWTH
Culture: NO GROWTH
Special Requests: ADEQUATE

## 2023-11-28 LAB — TRIGLYCERIDES: Triglycerides: 267 mg/dL — ABNORMAL HIGH (ref <150)

## 2023-11-28 MED ORDER — LIDOCAINE VISCOUS HCL 2 % MT SOLN
15.0000 mL | OROMUCOSAL | Status: DC | PRN
  Administered 2023-11-28 – 2023-11-29 (×3): 15 mL via OROMUCOSAL
  Filled 2023-11-28 (×4): qty 15

## 2023-11-28 MED ORDER — ACETAMINOPHEN 325 MG PO TABS
650.0000 mg | ORAL_TABLET | ORAL | Status: DC
  Administered 2023-11-28 – 2023-11-29 (×4): 650 mg via ORAL
  Filled 2023-11-28 (×4): qty 2

## 2023-11-28 MED ORDER — ACETAMINOPHEN 650 MG RE SUPP: 650.0000 mg | RECTAL | Status: DC

## 2023-11-28 MED ORDER — LORAZEPAM 2 MG/ML IJ SOLN: 1.0000 mg | Freq: Once | INTRAMUSCULAR | Status: DC | PRN

## 2023-11-28 NOTE — Progress Notes (Signed)
Progress Note   Patient: Alexandra Dorsey:[id number] DOB: [birth year] DOA: 11/23/2023     5 DOS: the patient was seen and examined on 11/28/2023   Brief hospital course: Pt is 48 yr female with ETOH abuse, anxiety, gout, HTN, hypothyroidism, ETOH abuse, accidental drug overdose, chronic prolonged QTC and recent hospitalization from 1/28 to 11/17/23 due to electrolyte abnormalities secondary to alcoholism presents to the ED with AMS. Per EMS, patient was attempting to self detox off alcohol for approximately 2 full days and had refused transporting to the hospital on 11/22/23. Patient was transported to the ED on 2/6 with AMS and multiple seizures in route. Patient was given 7.5 mg of IM versed during transport. Upon arrival to ED patient was receiving BVM and required intubation due inability to protect airway. PCCM consulted to admit and further manage patient. Neuro also consulted. HPI obtained from EMR.   2/6 admitted to ICU with status epilepticus in setting of ETOH use 2/7 extubated  2/11 transferred to floor    Assessment and Plan:  ETOH abuse Status epilepticus  Resolved  Wernicke's encephalopathy Resolving Patient currently at baseline level of mentation with no further seizures.  -Continue thiamine -Continue phenobarbital taper, CIWA + PRN ativan  -Consider naltrexone   Leukocytosis  Mild. Afebrile.  Continue to monitor for now.   Essential hypertension  Low normal blood pressures. Will hold home antihypertensives.   Hypothyroidism  Continue home synthroid   Gout  Resume home allopurinol.   L humeral fracture  NWB, sling per orthopedics.   Anxiety  H/o SI  Denies SI/HI.  Continue home psychotropic medications.      Subjective: Denies any acute complaints. No SI.   Physical Exam: Vitals:   11/27/23 2120 11/28/23 0018 11/28/23 0420 11/28/23 0500  BP: 124/83 99/78 92/74    Pulse: 85 88 82   Resp: 18  16   Temp: 98.2 F (36.8 C) 98.7 F (37.1 C) 98.7 F  (37.1 C)   TempSrc: Axillary Oral Oral   SpO2: 96% 96% 99%   Weight:    58 kg  Height:        Constitutional: In no distress.  Cardiovascular: Normal rate, regular rhythm. No lower extremity edema  Pulmonary: Non labored breathing on room air, no wheezing or rales.  No lower extremity edema Abdominal: Soft. Normal bowel sounds. Non distended and non tender. Skin with resolving ecchymoses on anterior of abdomen.  Musculoskeletal: L shoulder in sling.  Neurological: Alert and oriented to person, place, and time. Non focal  Skin: Skin is warm and dry. Ecchymosis of inner thigh jsut above knee.   Data Reviewed:  I have reviewed labs and images.     Latest Ref Rng & Units 11/28/2023   11:46 AM 11/26/2023    3:32 AM 11/25/2023    5:21 AM  BMP  Glucose 70 - 99 mg/dL 81  88  84   BUN 6 - 20 mg/dL <5  <5  5   Creatinine 0.44 - 1.00 mg/dL 9.56  2.13  0.86   Sodium 135 - 145 mmol/L 135  134  138   Potassium 3.5 - 5.1 mmol/L 3.3  3.5  4.1   Chloride 98 - 111 mmol/L 93  95  101   CO2 22 - 32 mmol/L 34  27  22   Calcium 8.9 - 10.3 mg/dL 8.5  8.8  8.7       Latest Ref Rng & Units 11/28/2023    5:58 AM 11/26/2023  3:32 AM 11/25/2023    5:21 AM  CBC  WBC 4.0 - 10.5 K/uL 11.4  10.6  8.5   Hemoglobin 12.0 - 15.0 g/dL 16.1  09.6  04.5   Hematocrit 36.0 - 46.0 % 35.2  30.7  31.0   Platelets 150 - 400 K/uL 380  292  236    MR BRAIN WO CONTRAST Result Date: 11/24/2023 CLINICAL DATA:  Seizure, new onset, history of trauma. EXAM: MRI HEAD WITHOUT CONTRAST TECHNIQUE: Multiplanar, multiecho pulse sequences of the brain and surrounding structures were obtained without intravenous contrast. COMPARISON:  Head CT November 23, 2023. FINDINGS: The study is partially degraded by motion. Brain: Subtle asymmetric hyperintensity of the thalamic pulvinar on the FLAIR sequences, more evident on the right side periaqueductal hyperintensity on coronal FLAIR. Moderate for age diffuse parenchymal volume loss. No acute  infarction, hemorrhage, hydrocephalus, extra-axial collection or mass lesion. A few scattered foci of T2 hyperintensity are seen within the white matter of the cerebral hemispheres, nonspecific. Hippocampal show normal signal characteristics. Vascular: Normal flow voids. Skull and upper cervical spine: Normal marrow signal. Sinuses/Orbits: Mild mucosal thickening throughout the paranasal sinuses and mastoids. The orbits are maintained. Other: None. IMPRESSION: 1. Subtle asymmetric hyperintensity of the thalamic pulvinar on the FLAIR sequences, more evident on the right side periaqueductal hyperintensity on coronal FLAIR. Findings are nonspecific but can be seen in the setting of Wernicke's encephalopathy. 2. Moderate for age diffuse parenchymal volume loss. Electronically Signed   By: Baldemar Lenis M.D.   On: 11/24/2023 11:21   Overnight EEG with video Result Date: 11/24/2023 Charlsie Quest, MD     11/25/2023  8:33 AM Patient Name: Alexandra Dorsey MRN: [id number] Epilepsy Attending: Charlsie Quest Referring Physician/Provider: Erick Blinks, MD Duration: 11/23/2023 1630 to 11/24/2023 1830 Patient history: 48 y.o. female with hx of EtOh use, EtOh withdrawal seizures, gout, HTN, who was brought in to the ED unresponsive by EMS. Has had 3 seizure today and intubated now. EEG to evaluate for seizure Level of alertness:  comatose AEDs during EEG study: Propofol Technical aspects: This EEG study was done with scalp electrodes positioned according to the 10-20 International system of electrode placement. Electrical activity was reviewed with band pass filter of 1-70Hz , sensitivity of 7 uV/mm, display speed of 57mm/sec with a 60Hz  notched filter applied as appropriate. EEG data were recorded continuously and digitally stored.  Video monitoring was available and reviewed as appropriate. Description: EEG showed continuous generalized 3 to 6 Hz theta-delta slowing admixed with 12-14Hz  beta activity  distributed symmetrically and diffusely. Hyperventilation and photic stimulation were not performed.   EEG was disconnected for testing on 11/24/2023 between 0944 to 1125 Parts of eeg were difficult to interpret due to significant electrode artifact. ABNORMALITY - Continuous slow, generalized IMPRESSION: This study is suggestive of moderate diffuse encephalopathy likely related to sedation. No seizures or definite epileptiform discharges were seen throughout the recording. Charlsie Quest   DG Abd 1 View Result Date: 11/23/2023 CLINICAL DATA:  OG tube placement EXAM: ABDOMEN - 1 VIEW COMPARISON:  04/25/2021 FINDINGS: Limited field of view for tube placement verification. An enteric tube has been placed with tip projecting over the left upper quadrant consistent with location in the body of the stomach. Visualized bowel gas pattern is normal. Lung bases are clear. Healing fracture of the posterior right ninth rib. IMPRESSION: Enteric tube tip projects over the left upper quadrant consistent with location in the body of the stomach. Electronically Signed  By: Burman Nieves M.D.   On: 11/23/2023 20:19   CT Head Wo Contrast Result Date: 11/23/2023 CLINICAL DATA:  Mental status change, unknown cause EXAM: CT HEAD WITHOUT CONTRAST TECHNIQUE: Contiguous axial images were obtained from the base of the skull through the vertex without intravenous contrast. RADIATION DOSE REDUCTION: This exam was performed according to the departmental dose-optimization program which includes automated exposure control, adjustment of the mA and/or kV according to patient size and/or use of iterative reconstruction technique. COMPARISON:  CT head June 4, 23. FINDINGS: Brain: No evidence of acute infarction, hemorrhage, hydrocephalus, extra-axial collection or mass lesion/mass effect. Vascular: No hyperdense vessel. Skull: No acute fracture. Sinuses/Orbits: Clear sinuses.  No acute orbital findings. Other: No mastoid effusions.  IMPRESSION: No evidence of acute intracranial abnormality. Electronically Signed   By: Feliberto Harts M.D.   On: 11/23/2023 16:16   DG Chest Port 1 View Result Date: 11/23/2023 CLINICAL DATA:  Seizure status post intubation EXAM: PORTABLE CHEST 1 VIEW COMPARISON:  Chest radiograph dated 10/14/2023 FINDINGS: Lines/tubes: Endotracheal tube tip projects 3.4 cm above the carina. Enteric tube tip reaches the diaphragm and terminates below the field of view. Lungs: Mildly low lung volumes.  Dense left retrocardiac opacity. Pleura: Questionable blunting of the left costophrenic angle. No pneumothorax. Heart/mediastinum: The heart size and mediastinal contours are within normal limits. Bones: No acute osseous abnormality. IMPRESSION: 1. Endotracheal tube tip projects 3.4 cm above the carina. 2. Dense left retrocardiac opacity, likely atelectasis. Aspiration or pneumonia can be considered in the appropriate clinical setting. 3. Questionable blunting of the left costophrenic angle, which may represent a small pleural effusion. Electronically Signed   By: Agustin Cree M.D.   On: 11/23/2023 14:43     Family Communication: Niece at bedside   Disposition: Status is: Inpatient Remains inpatient appropriate because: for management of alcohol withdrawal symptoms  Planned Discharge Destination: Home    Time spent: 35 minutes  Author: Marolyn Haller, MD 11/28/2023 3:52 PM  For on call review [email or web address].

## 2023-11-28 NOTE — Progress Notes (Signed)
Physical Therapy Treatment Patient Details Name: Alexandra Dorsey MRN: [id number] DOB: 03/17/1976 Today's Date: 11/28/2023   History of Present Illness Pt is a 48 y/o F presenting to ED 2/6 with seizures, attempting to self detox, CT head negative MRI with nonspecific findings but can be seen in setting of Wernicke's encephalopathy. Intubated 2/6-2/7. Xray of L shoulder with humeral head fx, currently being managed NWB in sling. PMH  includes gout, anxiety, ETOH abuse, hypothyroidism, accidental drug overdose, chornic prolonged QTC and recent hospitalization.    PT Comments  Making progress towards acute functional goals. Pt found to have Lt humeral head fx, now NWB through LLE in should sling. Without RW, patient ambulated in hallway with frequent min assist for LOB. Suggest trial of cane next visit. Still with altered mentation, decreased short term memory. Needs redirected intermittently, easily distracted, reduced safety awareness, and labile. With limited assistance available from family at d/c; now that she is unable to safely use RW due to UE precautions and continued awareness deficits, pt remains high risk for fall recommendations now for post acute rehab at [facility]. Will continue to progress and update as appropriate during admission.   If plan is discharge home, recommend the following: A little help with walking and/or transfers;A little help with bathing/dressing/bathroom;Assistance with cooking/housework;Direct supervision/assist for medications management;Direct supervision/assist for financial management;Assist for transportation;Supervision due to cognitive status   Can travel by private vehicle     Yes  Equipment Recommendations  Cane    Recommendations for Other Services       Precautions / Restrictions Precautions Precautions: Fall Precaution/Restrictions Comments: watch O2; sitter Restrictions Weight Bearing Restrictions Per Provider Order: Yes LUE Weight Bearing Per  Provider Order: Non weight bearing Other Position/Activity Restrictions: NWB, sling at ll times     Mobility  Bed Mobility Overal bed mobility: Needs Assistance Bed Mobility: Supine to Sit, Sit to Supine     Supine to sit: Contact guard Sit to supine: Contact guard assist   General bed mobility comments: CGA for safety, somewhat impulsive. Cues to be aware of shoulder precautions. Attempts to stand quickly after reaching EOB but is unsteady.    Transfers Overall transfer level: Needs assistance Equipment used: 1 person hand held assist Transfers: Sit to/from Stand Sit to Stand: Min assist           General transfer comment: Min assist for balance, increased sway. Lt shoulder sling in place at all times.    Ambulation/Gait Ambulation/Gait assistance: Min assist Gait Distance (Feet): 200 Feet Assistive device: None Gait Pattern/deviations: Step-through pattern, Decreased stride length, Staggering right, Drifts right/left, Scissoring, Staggering left Gait velocity: dec Gait velocity interpretation: <1.8 ft/sec, indicate of risk for recurrent falls   General Gait Details: Frequent min assist for impaired balance demonstrating staggering to Lt and Rt, worsens with head turns and distractions. Cues for attention on task at hand, awareness of hazards in hallway.   Stairs             Wheelchair Mobility     Tilt Bed    Modified Rankin (Stroke Patients Only)       Balance Overall balance assessment: Needs assistance Sitting-balance support: Feet supported Sitting balance-Leahy Scale: Fair     Standing balance support: During functional activity, Reliant on assistive device for balance Standing balance-Leahy Scale: Fair Standing balance comment: More stable with UE support.  Communication Communication Communication: No apparent difficulties  Cognition Arousal: Alert Behavior During Therapy: Impulsive, Lability   PT  - Cognitive impairments: Memory, Problem solving, Safety/Judgement                         Following commands: Impaired Following commands impaired: Only follows one step commands consistently    Cueing Cueing Techniques: Verbal cues, Gestural cues  Exercises      General Comments General comments (skin integrity, edema, etc.): SpO2 90% on RA, HR 100 while ambulating.      Pertinent Vitals/Pain Pain Assessment Pain Assessment: Faces Faces Pain Scale: Hurts little more Pain Location: L shoulder Pain Descriptors / Indicators: Discomfort Pain Intervention(s): Monitored during session, Repositioned    Home Living                          Prior Function            PT Goals (current goals can now be found in the care plan section) Acute Rehab PT Goals Patient Stated Goal: Go home PT Goal Formulation: With patient Time For Goal Achievement: 12/09/23 Potential to Achieve Goals: Good Progress towards PT goals: Progressing toward goals    Frequency    Min 1X/week      PT Plan      Co-evaluation              AM-PAC PT "6 Clicks" Mobility   Outcome Measure  Help needed turning from your back to your side while in a flat bed without using bedrails?: A Little Help needed moving from lying on your back to sitting on the side of a flat bed without using bedrails?: A Little Help needed moving to and from a bed to a chair (including a wheelchair)?: A Little Help needed standing up from a chair using your arms (e.g., wheelchair or bedside chair)?: A Little Help needed to walk in hospital room?: A Little Help needed climbing 3-5 steps with a railing? : A Lot 6 Click Score: 17    End of Session Equipment Utilized During Treatment: Gait belt (LUE sling) Activity Tolerance: Patient tolerated treatment well Patient left: in bed;with call bell/phone within reach;with bed alarm set;with nursing/sitter in room;with family/visitor present   PT Visit  Diagnosis: Unsteadiness on feet (R26.81);Other abnormalities of gait and mobility (R26.89);Repeated falls (R29.6);Muscle weakness (generalized) (M62.81);History of falling (Z91.81);Other symptoms and signs involving the nervous system (R29.898);Pain Pain - Right/Left: Left Pain - part of body: Shoulder     Time: 1478-2956 PT Time Calculation (min) (ACUTE ONLY): 12 min  Charges:    $Gait Training: 8-22 mins PT General Charges $$ ACUTE PT VISIT: 1 Visit                     Kathlyn Sacramento, PT, DPT [facility]  Rehabilitation Services Physical Therapist Office: [phone] Website: [email or web address]    Berton Mount 11/28/2023, 3:07 PM

## 2023-11-28 NOTE — Progress Notes (Signed)
Occupational Therapy Treatment Patient Details Name: Alexandra Dorsey MRN: [id number] DOB: 09-04-1976 Today's Date: 11/28/2023   History of present illness Pt is a 48 y/o F presenting to ED 2/6 with seizures, attempting to self detox, CT head negative MRI with nonspecific findings but can be seen in setting of Wernicke's encephalopathy. Intubated 2/6-2/7. Xray of L shoulder with humeral head fx, currently being managed NWB in sling. PMH  includes gout, anxiety, ETOH abuse, hypothyroidism, accidental drug overdose, chornic prolonged QTC and recent hospitalization.   OT comments  Pt making slow progress towards goals, continues to have decr cognition and overall awareness of deficits/precautions. Pt now with L humeral head fx, currently NWB in sling. Sling off upon arrival, max A to don sling and pt educated on precautions, but will benefit from continued reinforcement. Pt min A for ambulation to bathroom with HHA. Mod cues for safety upon returning to bed as pt attempting to crawl into bed in quadruped position vs sitting and then laying down. Pt presenting with impairments listed below, will follow acutely. Patient will benefit from continued inpatient follow up therapy, <3 hours/day to maximize safety/ind with ADL/functional mobility.       If plan is discharge home, recommend the following:  A little help with walking and/or transfers;A little help with bathing/dressing/bathroom;Assistance with cooking/housework;Assist for transportation;Direct supervision/assist for financial management;Direct supervision/assist for medications management   Equipment Recommendations  Tub/shower seat    Recommendations for Other Services PT consult    Precautions / Restrictions Precautions Precautions: Fall Precaution/Restrictions Comments: watch O2; sitter Restrictions Weight Bearing Restrictions Per Provider Order: Yes LUE Weight Bearing Per Provider Order: Non weight bearing Other Position/Activity  Restrictions: NWB, sling at at times       Mobility Bed Mobility Overal bed mobility: Needs Assistance Bed Mobility: Supine to Sit, Sit to Supine     Supine to sit: Min assist Sit to supine: Min assist   General bed mobility comments: pt quickly going from sit > supine during session, upon returning to bed pt attempting to crawl in bed in quadruped position, mod cues to defer and sit EOB for safety/shoulder restrictions    Transfers Overall transfer level: Needs assistance Equipment used: 1 person hand held assist Transfers: Sit to/from Stand Sit to Stand: Min assist                 Balance Overall balance assessment: Needs assistance Sitting-balance support: Feet supported Sitting balance-Leahy Scale: Fair     Standing balance support: During functional activity, Reliant on assistive device for balance Standing balance-Leahy Scale: Fair Standing balance comment: reliant on external support                           ADL either performed or assessed with clinical judgement   ADL Overall ADL's : Needs assistance/impaired                 Upper Body Dressing : Maximal assistance Upper Body Dressing Details (indicate cue type and reason): donning sling     Toilet Transfer: Minimal assistance;Ambulation;Regular Social worker and Hygiene: Supervision/safety       Functional mobility during ADLs: Minimal assistance      Extremity/Trunk Assessment Upper Extremity Assessment Upper Extremity Assessment: Right hand dominant;LUE deficits/detail LUE Deficits / Details: now with L humeral head fx, managed nonoperatively in sling LUE: Unable to fully assess due to immobilization   Lower Extremity Assessment Lower Extremity Assessment: Generalized  weakness        Vision   Additional Comments: pt reports side by side double vision and "blurriness" that has been going "on for a while" wears reading glasses.   Perception  Perception Perception: Not tested   Praxis Praxis Praxis: Not tested   Communication Communication Communication: Impaired   Cognition Arousal: Alert Behavior During Therapy: Restless, Flat affect Cognition: Cognition impaired   Orientation impairments: Situation, Time (only knows time because" It is  written on the board") Awareness: Intellectual awareness impaired, Online awareness impaired Memory impairment (select all impairments): Declarative long-term memory Attention impairment (select first level of impairment): Sustained attention (briefly) Executive functioning impairment (select all impairments): Reasoning, Problem solving OT - Cognition Comments: pt with decr overall awareness of deficits                          Cueing      Exercises      Shoulder Instructions       General Comments VSS    Pertinent Vitals/ Pain       Pain Assessment Pain Assessment: Faces Pain Score: 4  Faces Pain Scale: Hurts little more Pain Location: L shoulder Pain Descriptors / Indicators: Discomfort Pain Intervention(s): Limited activity within patient's tolerance, Monitored during session, Repositioned  Home Living                                          Prior Functioning/Environment              Frequency  Min 1X/week        Progress Toward Goals  OT Goals(current goals can now be found in the care plan section)  Progress towards OT goals: Progressing toward goals  Acute Rehab OT Goals Patient Stated Goal: none stated OT Goal Formulation: With patient Time For Goal Achievement: 12/09/23 Potential to Achieve Goals: Good ADL Goals Pt Will Perform Upper Body Dressing: sitting;Independently Pt Will Perform Lower Body Dressing: sitting/lateral leans;sit to/from stand;Independently Pt Will Transfer to Toilet: ambulating;regular height toilet;Independently Pt Will Perform Tub/Shower Transfer: Tub transfer;Shower  transfer;Independently;ambulating Additional ADL Goal #1: pt will tolerate OOB activity x10 min in prep for ADLs Additional ADL Goal #2: pt will follow 3 step command with min cues in prep for ADLs  Plan      Co-evaluation                 AM-PAC OT "6 Clicks" Daily Activity     Outcome Measure   Help from another person eating meals?: A Little Help from another person taking care of personal grooming?: A Little Help from another person toileting, which includes using toliet, bedpan, or urinal?: A Little Help from another person bathing (including washing, rinsing, drying)?: A Lot Help from another person to put on and taking off regular upper body clothing?: A Lot Help from another person to put on and taking off regular lower body clothing?: A Lot 6 Click Score: 15    End of Session Equipment Utilized During Treatment: Other (comment) (LUE sling)  OT Visit Diagnosis: Unsteadiness on feet (R26.81);Other abnormalities of gait and mobility (R26.89);Muscle weakness (generalized) (M62.81)   Activity Tolerance Patient tolerated treatment well   Patient Left in bed;with call bell/phone within reach;with bed alarm set;with nursing/sitter in room   Nurse Communication Mobility status        Time: [phone]  OT Time Calculation (min): 21 min  Charges: OT General Charges $OT Visit: 1 Visit OT Treatments $Self Care/Home Management : 8-22 mins  Carver Fila, OTD, OTR/L SecureChat Preferred Acute Rehab [phone]   Carver Fila Koonce 11/28/2023, 1:25 PM

## 2023-11-28 NOTE — Progress Notes (Signed)
Patient ID: Alexandra Dorsey, female   DOB: 1976-01-05, 48 y.o.   MRN: [id number]  MRI reviewed. Continue to plan on non-operative management with sling and NWB. Pendulum and scapular retractions allowed for PT/OT. F/u with Dr. Aundria Rud in 1 week.    Freeman Caldron, PA-C Orthopedic Surgery [phone]

## 2023-11-28 NOTE — [place] FL2 (Signed)
[place] MEDICAID FL2 LEVEL OF CARE FORM     IDENTIFICATION  Patient Name: Alexandra Dorsey Birthdate: [birth year] Sex: female Admission Date (Current Location): 11/23/2023  [facility] and [place] Number:  Producer, television/film/video and Address:  [facility], [address] [place], [place] [address]      Provider Number: [id number]  Attending Physician Name and Address:  Marolyn Haller, MD  Relative Name and Phone Number:  Kairy Folsom, father - [phone]    Current Level of Care: Hospital Recommended Level of Care: Skilled Nursing Facility Prior Approval Number:    Date Approved/Denied:   PASRR Number: pending  Discharge Plan: SNF    Current Diagnoses: Patient Active Problem List   Diagnosis Date Noted   Seizures (HCC) 11/23/2023   AKI (acute kidney injury) (HCC) 11/14/2023   Cough 09/12/2023   Drug overdose, accidental or unintentional, initial encounter 09/11/2023   Hyponatremia 09/11/2023   Chronic alcohol use 09/11/2023   Tetrahydrocannabinol (THC) use disorder, mild, abuse 09/11/2023   Chronic anemia 09/11/2023   Leukocytosis 09/11/2023   Generalized anxiety disorder 09/11/2023   Hypothyroidism 09/11/2023   QT prolongation 03/20/2022   Fall at home, initial encounter 03/20/2022   Anxiety    Hypocalcemia 04/27/2021   Hypophosphatemia    Gastroenteritis    Nausea & vomiting 04/25/2021   Hypokalemia 04/25/2021   Hypomagnesemia 04/25/2021   Dehydration 04/25/2021   Acute lower UTI 04/25/2021   Transaminitis 04/25/2021   Nausea, vomiting and diarrhea    Altered mental status 06/06/2017   Alcohol dependence with withdrawal with complication (HCC)    Lactic acidosis    Chills 05/29/2017   Non-intractable vomiting with nausea 05/29/2017   Acute pharyngitis 05/29/2017   Sore throat 05/29/2017   Essential hypertension 11/13/2016   Panic attacks 11/13/2016    Orientation RESPIRATION BLADDER Height & Weight     Self, Time,  Situation, Place  Normal Continent Weight: 58 kg Height:  5' (152.4 cm)  BEHAVIORAL SYMPTOMS/MOOD NEUROLOGICAL BOWEL NUTRITION STATUS      Continent Diet (See discharge summary)  AMBULATORY STATUS COMMUNICATION OF NEEDS Skin   Supervision Verbally Normal                       Personal Care Assistance Level of Assistance  Bathing, Feeding, Dressing Bathing Assistance: Maximum assistance Feeding assistance: Limited assistance Dressing Assistance: Maximum assistance     Functional Limitations Info  Sight, Hearing, Speech Sight Info: Impaired Hearing Info: Adequate Speech Info: Adequate    SPECIAL CARE FACTORS FREQUENCY  PT (By licensed PT), OT (By licensed OT)     PT Frequency: 5 x per week OT Frequency: 5 x per week            Contractures Contractures Info: Not present    Additional Factors Info  Code Status, Allergies, Psychotropic Code Status Info: Full Code Allergies Info: Lisinopril, Norvasc Psychotropic Info: Lexapro, Ativan         Current Medications (11/28/2023):  This is the current hospital active medication list Current Facility-Administered Medications  Medication Dose Route Frequency Provider Last Rate Last Admin   acetaminophen (TYLENOL) tablet 650 mg  650 mg Oral Q4H Marolyn Haller, MD       Or   acetaminophen (TYLENOL) suppository 650 mg  650 mg Rectal Q4H Marolyn Haller, MD       albuterol (PROVENTIL) (2.5 MG/3ML) 0.083% nebulizer solution 2.5 mg  2.5 mg Nebulization Q2H PRN Henrene Hawking, NP  Chlorhexidine Gluconate Cloth 2 % PADS 6 each  6 each Topical Daily Agarwala, Daleen Bo, MD   6 each at 11/28/23 1000   diclofenac Sodium (VOLTAREN) 1 % topical gel 2 g  2 g Topical QID Rana Snare, DO   2 g at 11/28/23 1407   docusate sodium (COLACE) capsule 100 mg  100 mg Oral BID Mannam, Praveen, MD   100 mg at 11/28/23 0856   escitalopram (LEXAPRO) tablet 20 mg  20 mg Oral Daily Mannam, Praveen, MD   20 mg at 11/28/23 0856   folic acid  (FOLVITE) tablet 1 mg  1 mg Oral Daily Migdalia Dk, MD   1 mg at 11/28/23 0855   heparin injection 5,000 Units  5,000 Units Subcutaneous Q8H Rochel Brome C, NP   5,000 Units at 11/28/23 1405   levothyroxine (SYNTHROID) tablet 25 mcg  25 mcg Oral Q0600 Mannam, Colbert Coyer, MD   25 mcg at 11/28/23 0607   lidocaine (XYLOCAINE) 2 % viscous mouth solution 15 mL  15 mL Mouth/Throat Q4H PRN Marolyn Haller, MD   15 mL at 11/28/23 1623   LORazepam (ATIVAN) injection 1 mg  1 mg Intravenous Q4H PRN Mannam, Praveen, MD   1 mg at 11/28/23 0929   LORazepam (ATIVAN) injection 1 mg  1 mg Intravenous Once PRN Marolyn Haller, MD       multivitamin with minerals tablet 1 tablet  1 tablet Oral Daily Migdalia Dk, MD   1 tablet at 11/28/23 0855   ondansetron (ZOFRAN) injection 4 mg  4 mg Intravenous Q6H PRN Atway, Rayann N, DO   4 mg at 11/24/23 1807   Oral care mouth rinse  15 mL Mouth Rinse PRN Mannam, Praveen, MD       pantoprazole (PROTONIX) EC tablet 40 mg  40 mg Oral Daily Melody Comas B, MD   40 mg at 11/28/23 0855   PHENobarbital (LUMINAL) tablet 64.8 mg  64.8 mg Oral Q8H Mannam, Praveen, MD   64.8 mg at 11/28/23 1406   Followed by   Melene Muller ON 11/29/2023] PHENobarbital (LUMINAL) tablet 32.4 mg  32.4 mg Oral Q8H Mannam, Praveen, MD       phenol (CHLORASEPTIC) mouth spray 1 spray  1 spray Mouth/Throat PRN Paliwal, Aditya, MD   1 spray at 11/28/23 1405   polyethylene glycol (MIRALAX / GLYCOLAX) packet 17 g  17 g Oral Daily Mannam, Praveen, MD   17 g at 11/28/23 0854   QUEtiapine (SEROQUEL) tablet 200 mg  200 mg Oral QHS Mannam, Praveen, MD   200 mg at 11/27/23 2144   sodium chloride flush (NS) 0.9 % injection 3-10 mL  3-10 mL Intravenous Q12H Rochel Brome C, NP   10 mL at 11/28/23 0857   sodium chloride flush (NS) 0.9 % injection 3-10 mL  3-10 mL Intravenous PRN Henrene Hawking, NP       thiamine (VITAMIN B1) tablet 100 mg  100 mg Oral Daily Caryl Pina, MD   100 mg at 11/28/23 0856    traZODone (DESYREL) tablet 50 mg  50 mg Oral QHS Mannam, Praveen, MD   50 mg at 11/27/23 2144     Discharge Medications: Please see discharge summary for a list of discharge medications.  Relevant Imaging Results:  Relevant Lab Results:   Additional Information SS# [id number]  Janae Bridgeman, RN

## 2023-11-28 NOTE — Progress Notes (Signed)
Transition of Care [facility]) - Inpatient Brief Assessment   Patient Details  Name: Alexandra Dorsey MRN: [id number] Date of Birth: [birth year]  Transition of Care [facility]) CM/SW Contact:    Janae Bridgeman, RN Phone Number: 11/28/2023, 3:52 PM   Clinical Narrative: CM was notified by the unit secretary that family would like to speak with case management.  The patient's first cousin, Lolita Cram, was at the bedside and obtained an insurance card for the patient.  I called admitting and provided the information to place in the chart.  The patient was living alone prior to hospitalization but now has cognitive issues and short term memory problems and is unable to return home alone without 24 hour supervision.  The patient provide permission that I speak with her father on the phone to provide [facility] coordination for a safe discharge plan for the patient.  The patient currently has a Recruitment consultant at the bedside.  I called and spoke with the patient's father by phone and he states that he was appreciate of the care at the hospital.  The patient states that he is a retired Clinical research associate and is retired at this time.  Patient's father states that he is unable to provide 24 supervision nor care to the patient.  The father states that he plans to obtain Medical and financial power of attorney for the patient to assist with TOC needs for the patient.  SNF work up is recommended by therapy at this time.  SNF work up will be started and patient will be faxed out for possible bed offers.  The father was made aware that patient may need LTC Medicaid/ Disability for placement outside of the SNF placement.  I send a secure email to financial counseling and asked for assistance for LTC Medicaid and disability application and notified them that patient's father is point of contact.  Patient will need SNF placement since patient is unable to return home and no family are available to provide care per the  father.   Transition of Care Asessment: Insurance and Status: (P) Insurance coverage has been reviewed (Patient has [employer] insurance - admitting called to place in chart) Patient has primary care physician: (P) Yes Home environment has been reviewed: (P) from home alone Prior level of function:: (P) INdependent prior to admission to the hospital Prior/Current Home Services: (P) No current home services Social Drivers of Health Review: (P) SDOH reviewed needs interventions Readmission risk has been reviewed: (P) Yes Transition of care needs: (P) transition of care needs identified, TOC will continue to follow

## 2023-11-28 NOTE — Plan of Care (Signed)
  Problem: Education: Goal: Expressions of having a comfortable level of knowledge regarding the disease process will increase Outcome: Progressing   Problem: Coping: Goal: Ability to adjust to condition or change in health will improve Outcome: Progressing Goal: Ability to identify appropriate support needs will improve Outcome: Progressing   Problem: Health Behavior/Discharge Planning: Goal: Compliance with prescribed medication regimen will improve Outcome: Progressing   Problem: Medication: Goal: Risk for medication side effects will decrease Outcome: Progressing   Problem: Clinical Measurements: Goal: Complications related to the disease process, condition or treatment will be avoided or minimized Outcome: Progressing Goal: Diagnostic test results will improve Outcome: Progressing   Problem: Safety: Goal: Verbalization of understanding the information provided will improve Outcome: Progressing   Problem: Self-Concept: Goal: Level of anxiety will decrease Outcome: Progressing Goal: Ability to verbalize feelings about condition will improve Outcome: Progressing   Problem: Education: Goal: Knowledge of General Education information will improve Description: Including pain rating scale, medication(s)/side effects and non-pharmacologic comfort measures Outcome: Progressing   Problem: Health Behavior/Discharge Planning: Goal: Ability to manage health-related needs will improve Outcome: Progressing   Problem: Clinical Measurements: Goal: Ability to maintain clinical measurements within normal limits will improve Outcome: Progressing Goal: Will remain free from infection Outcome: Progressing Goal: Diagnostic test results will improve Outcome: Progressing Goal: Respiratory complications will improve Outcome: Progressing Goal: Cardiovascular complication will be avoided Outcome: Progressing   Problem: Activity: Goal: Risk for activity intolerance will  decrease Outcome: Progressing   Problem: Nutrition: Goal: Adequate nutrition will be maintained Outcome: Progressing   Problem: Coping: Goal: Level of anxiety will decrease Outcome: Progressing   Problem: Elimination: Goal: Will not experience complications related to bowel motility Outcome: Progressing Goal: Will not experience complications related to urinary retention Outcome: Progressing   Problem: Pain Managment: Goal: General experience of comfort will improve and/or be controlled Outcome: Progressing   Problem: Safety: Goal: Ability to remain free from injury will improve Outcome: Progressing   Problem: Skin Integrity: Goal: Risk for impaired skin integrity will decrease Outcome: Progressing   Problem: Safety: Goal: Non-violent Restraint(s) Outcome: Progressing

## 2023-11-29 ENCOUNTER — Other Ambulatory Visit (HOSPITAL_COMMUNITY): Payer: Self-pay

## 2023-11-29 DIAGNOSIS — R569 Unspecified convulsions: Secondary | ICD-10-CM | POA: Diagnosis not present

## 2023-11-29 DIAGNOSIS — F10931 Alcohol use, unspecified with withdrawal delirium: Secondary | ICD-10-CM | POA: Diagnosis not present

## 2023-11-29 LAB — CBC WITH DIFFERENTIAL/PLATELET
Abs Immature Granulocytes: 0.15 10*3/uL — ABNORMAL HIGH (ref 0.00–0.07)
Basophils Absolute: 0.1 10*3/uL (ref 0.0–0.1)
Basophils Relative: 1 %
Eosinophils Absolute: 0.1 10*3/uL (ref 0.0–0.5)
Eosinophils Relative: 1 %
HCT: 36.4 % (ref 36.0–46.0)
Hemoglobin: 12.2 g/dL (ref 12.0–15.0)
Immature Granulocytes: 1 %
Lymphocytes Relative: 24 %
Lymphs Abs: 2.9 10*3/uL (ref 0.7–4.0)
MCH: 34.1 pg — ABNORMAL HIGH (ref 26.0–34.0)
MCHC: 33.5 g/dL (ref 30.0–36.0)
MCV: 101.7 fL — ABNORMAL HIGH (ref 80.0–100.0)
Monocytes Absolute: 0.8 10*3/uL (ref 0.1–1.0)
Monocytes Relative: 7 %
Neutro Abs: 8 10*3/uL — ABNORMAL HIGH (ref 1.7–7.7)
Neutrophils Relative %: 66 %
Platelets: 432 10*3/uL — ABNORMAL HIGH (ref 150–400)
RBC: 3.58 MIL/uL — ABNORMAL LOW (ref 3.87–5.11)
RDW: 13.5 % (ref 11.5–15.5)
WBC: 12.1 10*3/uL — ABNORMAL HIGH (ref 4.0–10.5)
nRBC: 0 % (ref 0.0–0.2)

## 2023-11-29 LAB — COMPREHENSIVE METABOLIC PANEL
ALT: 9 U/L (ref 0–44)
AST: 16 U/L (ref 15–41)
Albumin: 3.3 g/dL — ABNORMAL LOW (ref 3.5–5.0)
Alkaline Phosphatase: 54 U/L (ref 38–126)
Anion gap: 17 — ABNORMAL HIGH (ref 5–15)
BUN: 6 mg/dL (ref 6–20)
CO2: 28 mmol/L (ref 22–32)
Calcium: 9.2 mg/dL (ref 8.9–10.3)
Chloride: 90 mmol/L — ABNORMAL LOW (ref 98–111)
Creatinine, Ser: 0.75 mg/dL (ref 0.44–1.00)
GFR, Estimated: >60 mL/min (ref >60)
Glucose, Bld: 85 mg/dL (ref 70–99)
Potassium: 3.4 mmol/L — ABNORMAL LOW (ref 3.5–5.1)
Sodium: 135 mmol/L (ref 135–145)
Total Bilirubin: 0.5 mg/dL (ref 0.0–1.2)
Total Protein: 6.3 g/dL — ABNORMAL LOW (ref 6.5–8.1)

## 2023-11-29 LAB — PHOSPHORUS: Phosphorus: 4.7 mg/dL — ABNORMAL HIGH (ref 2.5–4.6)

## 2023-11-29 LAB — MAGNESIUM: Magnesium: 1.3 mg/dL — ABNORMAL LOW (ref 1.7–2.4)

## 2023-11-29 MED ORDER — BOOST / RESOURCE BREEZE PO LIQD CUSTOM
1.0000 | Freq: Two times a day (BID) | ORAL | Status: DC
Start: 2023-11-29 — End: 2023-11-29
  Administered 2023-11-29: 237 mL via ORAL

## 2023-11-29 MED ORDER — QUETIAPINE FUMARATE 200 MG PO TABS
200.0000 mg | ORAL_TABLET | Freq: Every day | ORAL | 0 refills | Status: DC
  Filled 2023-11-29: qty 15, 15d supply, fill #0

## 2023-11-29 MED ORDER — DICLOFENAC SODIUM 1 % EX GEL
2.0000 g | Freq: Four times a day (QID) | CUTANEOUS | 0 refills | Status: DC
  Filled 2023-11-29: qty 100, 30d supply, fill #0

## 2023-11-29 MED ORDER — POTASSIUM CHLORIDE CRYS ER 20 MEQ PO TBCR
20.0000 meq | EXTENDED_RELEASE_TABLET | Freq: Every day | ORAL | 0 refills | Status: DC
  Filled 2023-11-29: qty 6, 6d supply, fill #0

## 2023-11-29 MED ORDER — NALTREXONE HCL 50 MG PO TABS
50.0000 mg | ORAL_TABLET | Freq: Every day | ORAL | 0 refills | Status: DC
Start: 2023-11-29 — End: 2023-12-24
  Filled 2023-11-29: qty 30, 30d supply, fill #0

## 2023-11-29 MED ORDER — LEVOTHYROXINE SODIUM 25 MCG PO TABS
25.0000 ug | ORAL_TABLET | Freq: Every day | ORAL | 0 refills | Status: DC
  Filled 2023-11-29: qty 15, 15d supply, fill #0

## 2023-11-29 MED ORDER — THIAMINE HCL 100 MG PO TABS
100.0000 mg | ORAL_TABLET | Freq: Every day | ORAL | 3 refills | Status: DC
  Filled 2023-11-29: qty 30, 30d supply, fill #0

## 2023-11-29 NOTE — TOC Transition Note (Signed)
Transition of Care [facility]) - Discharge Note   Patient Details  Name: Alexandra Dorsey MRN: [id number] Date of Birth: January 28, 1976  Transition of Care [facility]) CM/SW Contact:  Janae Bridgeman, RN Phone Number: 11/29/2023, 3:51 PM   Clinical Narrative:    CM spoke with Dr. Montez Morita and the patient would like to return home.  The patient was living alone prior to hospitalization but the patient's neighbor, Alexandra Dorsey, plans to provide assistance and 24 hour supervision at the home.  Dr. Montez Morita is aware and states that the patient has capacity to make informed decisions and plans to discharge the patient home.    I spoke with the patient at the bedside and she plans to purchase a tub/shower seat.  Follow up PCP and orthopedic appointments were placed in the AVs for patient to call and schedule.  Patient states that she plans to seek OP counseling for ETOH abuse.  Resources provided in the AVS. \ No other TOC needs at this time and patient plans to discharge home with her neighbor.     Barriers to Discharge: Continued Medical Work up   Patient Goals and CMS Choice Patient states their goals for this hospitalization and ongoing recovery are:: return home          Discharge Placement                       Discharge Plan and Services Additional resources added to the After Visit Summary for   In-house Referral: Clinical Social Work                                   Social Drivers of Health (SDOH) Interventions SDOH Screenings   Food Insecurity: Patient Unable To Answer (11/25/2023)  Housing: Patient Unable To Answer (11/25/2023)  Transportation Needs: Unknown (11/25/2023)  Utilities: Patient Unable To Answer (11/25/2023)  Tobacco Use: Medium Risk (11/23/2023)     Readmission Risk Interventions    11/28/2023    3:51 PM 11/16/2023   10:45 AM  Readmission Risk Prevention Plan  Transportation Screening Complete Complete  HRI or Home Care Consult  Complete  Social Work  Consult for Recovery Care Planning/Counseling  Complete  Palliative Care Screening  Not Applicable  Medication Review Ocean[facility]) Complete Complete  PCP or Specialist appointment within 3-5 days of discharge Complete   HRI or Home Care Consult Complete   SW Recovery Care/Counseling Consult Complete   Palliative Care Screening Not Applicable   Skilled Nursing Facility Complete

## 2023-11-29 NOTE — Plan of Care (Signed)
  Problem: Education: Goal: Expressions of having a comfortable level of knowledge regarding the disease process will increase Outcome: Progressing   Problem: Coping: Goal: Ability to adjust to condition or change in health will improve Outcome: Progressing Goal: Ability to identify appropriate support needs will improve Outcome: Progressing   Problem: Health Behavior/Discharge Planning: Goal: Compliance with prescribed medication regimen will improve Outcome: Progressing   Problem: Medication: Goal: Risk for medication side effects will decrease Outcome: Progressing   Problem: Clinical Measurements: Goal: Complications related to the disease process, condition or treatment will be avoided or minimized Outcome: Progressing Goal: Diagnostic test results will improve Outcome: Progressing   Problem: Safety: Goal: Verbalization of understanding the information provided will improve Outcome: Progressing   Problem: Self-Concept: Goal: Level of anxiety will decrease Outcome: Progressing Goal: Ability to verbalize feelings about condition will improve Outcome: Progressing   Problem: Education: Goal: Knowledge of General Education information will improve Description: Including pain rating scale, medication(s)/side effects and non-pharmacologic comfort measures Outcome: Progressing   Problem: Health Behavior/Discharge Planning: Goal: Ability to manage health-related needs will improve Outcome: Progressing   Problem: Clinical Measurements: Goal: Ability to maintain clinical measurements within normal limits will improve Outcome: Progressing Goal: Will remain free from infection Outcome: Progressing Goal: Diagnostic test results will improve Outcome: Progressing Goal: Respiratory complications will improve Outcome: Progressing Goal: Cardiovascular complication will be avoided Outcome: Progressing   Problem: Activity: Goal: Risk for activity intolerance will  decrease Outcome: Progressing   Problem: Nutrition: Goal: Adequate nutrition will be maintained Outcome: Progressing   Problem: Coping: Goal: Level of anxiety will decrease Outcome: Progressing   Problem: Elimination: Goal: Will not experience complications related to bowel motility Outcome: Progressing Goal: Will not experience complications related to urinary retention Outcome: Progressing   Problem: Pain Managment: Goal: General experience of comfort will improve and/or be controlled Outcome: Progressing   Problem: Safety: Goal: Ability to remain free from injury will improve Outcome: Progressing   Problem: Skin Integrity: Goal: Risk for impaired skin integrity will decrease Outcome: Progressing   Problem: Safety: Goal: Non-violent Restraint(s) Outcome: Progressing

## 2023-11-29 NOTE — Progress Notes (Signed)
Mobility Specialist: Progress Note   11/29/23 1526  Mobility  Activity Ambulated independently in hallway  Level of Assistance Independent  Assistive Device None  Distance Ambulated (ft) 450 ft  LUE Weight Bearing Per Provider Order NWB  Activity Response Tolerated well  Mobility Referral Yes  Mobility visit 1 Mobility  Mobility Specialist Start Time (ACUTE ONLY) 1420  Mobility Specialist Stop Time (ACUTE ONLY) 1427  Mobility Specialist Time Calculation (min) (ACUTE ONLY) 7 min    Pt was agreeable to mobility session - received in bed. Ind throughout. Brought cane to trial but pt was able to ambulated without AD with no unsteadiness or LOB. C/o L shoulder pain and tongue pain from biting down on it during recent seizure. Returned to room without fault. Left in bed with all needs met, call bell in reach. Sitter by room.   Maurene Capes Mobility Specialist Please contact via SecureChat or Rehab office at [phone]

## 2023-11-29 NOTE — Progress Notes (Signed)
 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 Intubated 2/6-2/7. Xray of L shoulder with humeral head fx, currently being managed NWB in sling. PMH  includes gout, anxiety, ETOH abuse, hypothyroidism, accidental drug overdose, chornic prolonged QTC and recent hospitalization.   OT comments  Pt progressing toward goals this session, needing supervision- min A for ADLs, CGA for bed mobility and min A for transfers with HHA. Pt educated on sling wear an positioning, along with approved LUE therex (elbow/wrist/hand/pendulums/scap retraction) with handouts provided. Pt verbalized understanding but with poor recall when asked a few mins later, will need continued reinforcement. Pt presenting with impairments listed below, will follow acutely. Patient will benefit from continued inpatient follow up therapy, <3 hours/day to maximize safety/ind with ADL/functional mobility.       If plan is discharge home, recommend the following:  A little help with walking and/or transfers;A little help with bathing/dressing/bathroom;Assistance with cooking/housework;Assist for transportation;Direct supervision/assist for financial management;Direct supervision/assist for medications management   Equipment Recommendations  Tub/shower seat    Recommendations for Other Services PT consult    Precautions / Restrictions Precautions Precautions: Fall Precaution/Restrictions Comments: watch O2; sitter Restrictions Weight Bearing Restrictions Per Provider Order: Yes LUE Weight Bearing Per Provider Order: Non weight bearing Other Position/Activity Restrictions: NWB, sling at ll times       Mobility Bed Mobility Overal bed  mobility: Needs Assistance Bed Mobility: Supine to Sit, Sit to Supine     Supine to sit: Contact guard Sit to supine: Contact guard assist        Transfers Overall transfer level: Needs assistance Equipment used: 1 person hand held assist Transfers: Sit to/from Stand Sit to Stand: Min assist                 Balance Overall balance assessment: Needs assistance Sitting-balance support: Feet supported Sitting balance-Leahy Scale: Fair     Standing balance support: During functional activity, Reliant on assistive device for balance Standing balance-Leahy Scale: Fair Standing balance comment: More stable with UE support.                           ADL either performed or assessed with clinical judgement   ADL       Grooming: Wash/dry hands;Minimal assistance;Standing Grooming Details (indicate cue type and reason): standing at sink                 Toilet Transfer: Minimal assistance;Ambulation;Regular Social worker and Hygiene: Supervision/safety       Functional mobility during ADLs: Minimal assistance      Extremity/Trunk Assessment Upper Extremity Assessment LUE Deficits / Details: now with L humeral head fx, managed nonoperatively in sling, elbow wrist/hand WFL, demo's scap retraction and pendulums LUE: Unable to fully assess due to immobilization   Lower Extremity Assessment Lower Extremity Assessment: Generalized weakness        Vision   Additional Comments: pt reports side by side double vision and "blurriness" that has been going "on for a while" wears reading glasses.   Perception Perception Perception: Not tested   Praxis Praxis Praxis: Not tested   Communication Communication Communication: No apparent difficulties   Cognition Arousal: Alert  Behavior During Therapy: Restless, Impulsive Cognition: Cognition impaired   Orientation impairments: Situation, Time   Memory impairment (select all  impairments): Declarative long-term memory Attention impairment (select first level of impairment): Sustained attention (briefly) Executive functioning impairment (select all impairments): Reasoning, Problem solving OT - Cognition Comments: pt with decr overall awareness of deficits                 Following commands: Impaired Following commands impaired: Only follows one step commands consistently      Cueing   Cueing Techniques: Verbal cues, Gestural cues  Exercises Exercises: Other exercises Other Exercises Other Exercises: bil scap retraction x10 Other Exercises: LUE pendulum (side to side, foward back, and clock/counterclockwise) x10 Other Exercises: LUE elbow flex/ext and pron/sup x10 Other Exercises: wrist flex/ext x10 Other Exercises: blue foam cube squeeze x10    Shoulder Instructions       General Comments VSS on RA    Pertinent Vitals/ Pain       Pain Assessment Pain Assessment: Faces Pain Score: 4  Faces Pain Scale: Hurts little more Pain Location: L shoulder Pain Descriptors / Indicators: Discomfort Pain Intervention(s): Limited activity within patient's tolerance, Monitored during session, Repositioned  Home Living                                          Prior Functioning/Environment              Frequency  Min 1X/week        Progress Toward Goals  OT Goals(current goals can now be found in the care plan section)  Progress towards OT goals: Progressing toward goals  Acute Rehab OT Goals Patient Stated Goal: none stated OT Goal Formulation: With patient Time For Goal Achievement: 12/09/23 Potential to Achieve Goals: Good ADL Goals Pt Will Perform Upper Body Dressing: sitting;Independently Pt Will Perform Lower Body Dressing: sitting/lateral leans;sit to/from stand;Independently Pt Will Transfer to Toilet: ambulating;regular height toilet;Independently Pt Will Perform Tub/Shower Transfer: Tub transfer;Shower  transfer;Independently;ambulating Additional ADL Goal #1: pt will tolerate OOB activity x10 min in prep for ADLs Additional ADL Goal #2: pt will follow 3 step command with min cues in prep for ADLs  Plan      Co-evaluation                 AM-PAC OT "6 Clicks" Daily Activity     Outcome Measure   Help from another person eating meals?: A Little Help from another person taking care of personal grooming?: A Little Help from another person toileting, which includes using toliet, bedpan, or urinal?: A Little Help from another person bathing (including washing, rinsing, drying)?: A Lot Help from another person to put on and taking off regular upper body clothing?: A Lot Help from another person to put on and taking off regular lower body clothing?: A Lot 6 Click Score: 15    End of Session Equipment Utilized During Treatment: Other (comment) (LUE sling)  OT Visit Diagnosis: Unsteadiness on feet (R26.81);Other abnormalities of gait and mobility (R26.89);Muscle weakness (generalized) (M62.81)   Activity Tolerance Patient tolerated treatment well   Patient Left in bed;with call bell/phone within reach;with bed alarm set;with nursing/sitter in room   Nurse Communication Mobility status        Time: 1610-9604 OT Time Calculation (min): 19 min  Charges: OT General Charges $OT Visit: 1 Visit OT Treatments $Therapeutic Activity: 8-22  mins  Carver Fila, OTD, OTR/L SecureChat Preferred Acute Rehab [phone]   Dalphine Handing 11/29/2023, 11:44 AM

## 2023-11-29 NOTE — Discharge Instructions (Signed)
Please take your medications as prescribed.   You will need to follow up with your PCP in 1 week.   For your arm you will need to see the orthopedic surgeon below in 1 week. You will need to call to schedule this appointment.

## 2023-11-29 NOTE — Progress Notes (Signed)
Nutrition Follow-up  DOCUMENTATION CODES:   Not applicable  INTERVENTION:  - Regular diet.  - Boost Breeze po TID, each supplement provides 250 kcal and 9 grams of protein - Encourage intake at all meals and of supplements.  - Continue Multivitamin with minerals daily, folic acid, and thiamine for etoh abuse.  - Monitor weight trends.   NUTRITION DIAGNOSIS:   Increased nutrient needs related to acute illness as evidenced by estimated needs. *new  GOAL:   Patient will meet greater than or equal to 90% of their needs *progressing  MONITOR:   PO intake, Supplement acceptance, Weight trends  REASON FOR ASSESSMENT:   Ventilator    ASSESSMENT:   Pt admitted for multiple seizures and was intubated in the ED. Pt with PMH of EtOH abuse, anxiety, gout, hypothyroidism, accidental drug overdose, chronic prolonged QTC. Recent admission from 1/28-1/31 with electrolyte abnormalities secondary to alcoholism.  2/6 Admit; Intubated 2/7 Extubated; CLD 2/8 Regular diet  RD working remotely. Called patient via bedside telephone.   She reports a UBW of 121# and states she started gaining weight over the past 2-3 months. Unsure as to a cause. Per EMR, weight has been stable since at least November.  She endorses her eating habits vary daily. Always has at least 1 meal and often snacks during the day. Appetite normal.  Current appetite is good and patient reports eating well since diet advanced to regular. She is documented to be consuming 75-100% of meals. She doesn't love traditional nutrition supplements like Ensure but agreeable to try Boost Breeze.  She reports she is supposed to go home today.    Admit weight: 132# Current weight: 131#  Medications reviewed and include: Colace, Miralax, 1mg  folic acid, MVI, 100mg  thiamine   Labs reviewed:  K+ 3.4 Phosphorus 4.7 Magnesium 1.3   Diet Order:   Diet Order             Diet regular Room service appropriate? Yes; Fluid  consistency: Thin  Diet effective now                   EDUCATION NEEDS:  Education needs have been addressed  Skin:  Skin Assessment: Reviewed RN Assessment  Last BM:  2/12  Height:  Ht Readings from Last 1 Encounters:  11/23/23 5' (1.524 m)   Weight:  Wt Readings from Last 1 Encounters:  11/29/23 59.6 kg   Ideal Body Weight:  45.5 kg  BMI:  Body mass index is 25.66 kg/m.  Estimated Nutritional Needs:  Kcal:  1500-1700 Protein:  75-90 g Fluid:  1.5-1.7 L    Shelle Iron RD, LDN Contact via Secure Chat.

## 2023-12-01 ENCOUNTER — Other Ambulatory Visit: Payer: Self-pay | Admitting: Student

## 2023-12-01 DIAGNOSIS — F1090 Alcohol use, unspecified, uncomplicated: Secondary | ICD-10-CM

## 2023-12-01 MED ORDER — MAGNESIUM 400 MG PO TABS: 1.0000 | ORAL_TABLET | Freq: Two times a day (BID) | ORAL | 0 refills | Status: AC

## 2023-12-01 NOTE — Progress Notes (Unsigned)
Sent oral magnesium. Patient will have recheck at PCP visit 12/06/23. Suspect will improve with abstinence from alcohol.

## 2023-12-01 NOTE — Discharge Summary (Addendum)
 Physician Discharge Summary   Patient: Alexandra Dorsey MRN: [id number] DOB: 1976/09/26  Admit date:     11/23/2023  Discharge date: 11/29/2023  Discharge Physician: Marolyn Haller   PCP: Wilfrid Lund, PA   Recommendations at discharge:    CBC to ensure leukocytosis is resolving.  Check magnesium level  Patient needs follow up with Dr. Aundria Rud in 1 week of orthopedics for her L humeral fracture   Discharge Diagnoses: Principal Problem:   Seizures (HCC)  Resolved Problems:   * No resolved hospital problems. Scnetx Course: 48 yr female with ETOH abuse, anxiety, gout, HTN, hypothyroidism, ETOH abuse, accidental drug overdose, chronic prolonged QTC and recent hospitalization from 1/28 to 11/17/23 due to electrolyte abnormalities secondary to alcoholism presents to the ED with AMS. Per EMS, patient was attempting to self detox off alcohol for approximately 2 full days and had refused transporting to the hospital on 11/22/23. Patient was transported to the ED on 2/6 with AMS and multiple seizures in route. Patient was given 7.5 mg of IM versed during transport. Upon arrival to ED patient was receiving BVM and required intubation due inability to protect airway. PCCM consulted to admit and further manage patient. Neuro also consulted. HPI obtained from EMR.   She was liberated from the ventilator 2/7. She was weaned off of precidex 2/9. And transferred to the floor  2/10.  She was continued on a phenobarbital and had no further seizure activity after admission to the hospital.   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 Her seizures were thought to be due to alcohol withdrawal versus severe hypomagnesium. She Had no further seizure activity while on the floor and no further evidence of alcohol withdrawal. Patient had some ataxia and memory issues  after extubation and her MRI brain had findings suggestive of Wernicke's. She was given high dose thiamine and continued on this at discharge. Her mentation did improve though she did have mild memory issues at times. Patient was started on naltrexone to help with her cravings. Patient was not interested in rehab or in a support group for her alcohol use.   Because of her mild unsteadiness and her L humeral fracture it was recommended patient go to rehab. Had extensive discussion with patient regarding the repercussions of being discharged as opposed to continued work with our therapists and potentially a SNF including potentially falling again. She stated that she understood her alcohol use is leading to her to develop long term issues with balance and that she could potentially fall again. Despite these risks she would like to proceed with discharge. Spoke with patient's father who stated that she would have help at home while she recovers and the patient notes that she has a neighbor who frequently checks on her. Prior to this discussion patient was alert and oriented times four and she denied any SI or HI. She had denied this on the day prior to discharge as well.   L humeral, mildly displaced fracture Orthopedics was consulted. Patients fracture will be managed non operatively. She will need follow up with orthopedics in 1 week with repeat imaging.   HTN    11/29/2023   11:59 AM 11/29/2023    8:25 AM 11/29/2023    6:00 AM  Vitals with BMI  Weight   131 lbs 6 oz  BMI   25.66  Systolic 116 130  Diastolic 75 87   Pulse 91 76    Patient normotensive. Amlodipine was held. She will follow up with her PCP in 1 week, and decide at that time when to resume this medication.   Pneumonia ruled out Documented initially due to chest x ray findings. Patient had negative procalcitonin. She likely had aspiration event in the setting of her seizure versus atelectasis versus both. Fortunately this did not  progress to a pneumonia.   Hypokalemia Mild on discharge. Patient was discharged with supplementation. This will need to be rechecked at hospital follow up.   Hypomagnesemia  Mild on discharge 1.3. This will need to be rechecked on discharge.   AKI Resolved     Latest Ref Rng & Units 11/29/2023    6:22 AM 11/28/2023   11:46 AM 11/26/2023    3:32 AM  BMP  Glucose 70 - 99 mg/dL 85  81  88   BUN 6 - 20 mg/dL 6  <5  <5   Creatinine 0.44 - 1.00 mg/dL 1.61  0.96  0.45   Sodium 135 - 145 mmol/L 135  135  134   Potassium 3.5 - 5.1 mmol/L 3.4  3.3  3.5   Chloride 98 - 111 mmol/L 90  93  95   CO2 22 - 32 mmol/L 28  34  27   Calcium 8.9 - 10.3 mg/dL 9.2  8.5  8.8    Leukocytosis     Latest Ref Rng & Units 11/29/2023    6:22 AM 11/28/2023    5:58 AM 11/26/2023    3:32 AM  CBC  WBC 4.0 - 10.5 K/uL 12.1  11.4  10.6   Hemoglobin 12.0 - 15.0 g/dL 40.9  81.1  91.4   Hematocrit 36.0 - 46.0 % 36.4  35.2  30.7   Platelets 150 - 400 K/uL 432  380  292   Mild. Patient was afebrile. She had no symptoms of infection.  This was monitored. Will need repeat cbc at follow up.   Thrombocytosis  Likely reactive. No intervention was required.   Hypothyroidism Her home synthroid was resumed   Respiratory failure Resolved   Gout Allopurinol was resumed         Consultants: PCCM  Procedures performed: intubation  Disposition: Home Diet recommendation:  Regular diet DISCHARGE MEDICATION: Allergies as of 11/29/2023       Reactions   Lisinopril Cough   Norvasc [amlodipine Besylate] Other (See Comments)   Feet hurts        Medication List     PAUSE taking these medications    amLODipine 10 MG tablet Wait to take this until your doctor or other care provider tells you to start again. Commonly known as: NORVASC Take 10 mg by mouth daily.       TAKE these medications    acetaminophen 325 MG tablet Commonly known as: TYLENOL Take 2 tablets (650 mg total) by mouth every 6 (six)  hours as needed for mild pain (pain score 1-3) (or Fever >/= 101).   albuterol 108 (90 Base) MCG/ACT inhaler Commonly known as: VENTOLIN HFA 1 puff as needed Inhalation every 4 hrs for 30 days   allopurinol 300 MG tablet Commonly known as: ZYLOPRIM TAKE 1 TABLET BY MOUTH DAILY Orally Once a day   clonazePAM 1 MG tablet Commonly known as: KLONOPIN Take 1 mg by mouth 2 (two) times daily as needed for anxiety.   diclofenac Sodium 1 % Gel Commonly known as: VOLTAREN Apply 2 g  topically 4 (four) times daily. To left shoulder   escitalopram 20 MG tablet Commonly known as: LEXAPRO Take 20 mg by mouth daily.   folic acid 1 MG tablet Commonly known as: FOLVITE Take 1 tablet (1 mg total) by mouth daily.   levothyroxine 25 MCG tablet Commonly known as: SYNTHROID Take 1 tablet (25 mcg total) by mouth daily before breakfast. What changed: See the new instructions.   naltrexone 50 MG tablet Commonly known as: DEPADE Take 1 tablet (50 mg total) by mouth daily.   omeprazole 40 MG capsule Commonly known as: PRILOSEC 1 capsule 30 minutes before morning meal Orally Once a day   potassium chloride SA 20 MEQ tablet Commonly known as: KLOR-CON M Take 1 tablet (20 mEq total) by mouth daily. What changed: how much to take   QUEtiapine 200 MG tablet Commonly known as: SEROQUEL Take 1 tablet (200 mg total) by mouth at bedtime.   thiamine 100 MG tablet Commonly known as: VITAMIN B1 Take 1 tablet (100 mg total) by mouth daily.   traZODone 50 MG tablet Commonly known as: DESYREL Take 50 mg by mouth at bedtime.        Follow-up Information     Yolonda Kida, MD Follow up.   Specialty: Orthopedic Surgery Why: Please call the office and schedule an appointment for follow up in the next week. Contact information: [address] [place] [address] [phone]         Wilfrid Lund, PA Follow up.   Specialty: Family Medicine Why: Call your primary  care office and schedule a hospital follow up in the next 7-10 days. Contact information: [address] [place] [address] [phone]                Discharge Exam: Filed Weights   11/26/23 0400 11/28/23 0500 11/29/23 0600  Weight: 67 kg 58 kg 59.6 kg   Physical Exam  Constitutional: In no distress.  Cardiovascular: Normal rate, regular rhythm. No lower extremity edema  Pulmonary: Non labored breathing on room air, no wheezing or rales.   Abdominal: Soft. Normal bowel sounds. Non distended and non tender Musculoskeletal: Normal range of motion.     Neurological: Alert and oriented to person, place, and time. Non focal  Skin: Skin is warm and dry.    Condition at discharge: good  The results of significant diagnostics from this hospitalization (including imaging, microbiology, ancillary and laboratory) are listed below for reference.   Imaging Studies: MR SHOULDER LEFT WO CONTRAST Result Date: 11/28/2023 CLINICAL DATA:  Shoulder trauma.  Labral tear suspected. EXAM: MRI OF THE LEFT SHOULDER WITHOUT CONTRAST TECHNIQUE: Multiplanar, multisequence MR imaging of the shoulder was performed. No intravenous contrast was administered. The technologist reports the patient was unable to hold still. Limited study of only 2 sequences performed. COMPARISON:  Left shoulder radiographs 11/27/2023 and 10/14/2023 FINDINGS: This study is limited. Only single coronal T2 fat saturation and axial T2 fat saturation sequences could be performed. The patient was unable to hold still on the study was ended early. In addition, there is moderate to high-grade patient motion artifact technically limits this examination. Rotator cuff: The supraspinatus tendon footprint inserts on the marrow edema and acute fracture of the greater tuberosity described below. Mild intermediate T2 signal tendinosis of the anterior greater than posterior supraspinatus tendon, involving the distal critical zone  and tendon footprint anteriorly (coronal series 6, image 14) and the tendon footprint posteriorly (coronal series 6,  image 12). No definite tear within the infraspinatus. The subscapularis and teres minor are grossly intact on the provided limited images. Muscles: No definite rotator cuff muscle atrophy or signal abnormality. Biceps long head: No gross tear is seen within the long head of the biceps tendon. Acromioclavicular Joint: There are mild degenerative changes of the acromioclavicular joint including joint space narrowing, subchondral marrow edema, and peripheral osteophytosis. Mild fluid within the subacromial/subdeltoid bursa. Glenohumeral Joint: Not well evaluated due to motion artifact. Labrum: Not well evaluated due to lack of intra-articular fluid and motion artifact. Bones: There is high-grade marrow edema within the anterior greater tuberosity related to the known acute fracture of the greater tuberosity seen on prior radiographs. Only minimal 1-2 mm cortical step-off. No significant fracture line displacement. Other: None. IMPRESSION: 1. This study is limited. Only single coronal T2 fat saturation and axial T2 fat saturation sequences could be performed due to patient condition and motion. There moderate to high-grade patient motion artifact on the two sequences that were able to be completed. 2. There is high-grade marrow edema within the anterior greater tuberosity related to the known acute fracture of the greater tuberosity seen on prior radiographs. Only minimal 1-2 mm cortical step-off. No significant fracture line displacement. 3. Mild tendinosis of the anterior greater than posterior supraspinatus tendon, which inserts on the region of the greater tuberosity fracture. 4. Mild degenerative changes of the acromioclavicular joint. 5. Mild fluid within the subacromial/subdeltoid bursa. Electronically Signed   By: Neita Garnet M.D.   On: 11/28/2023 08:38   DG Shoulder Left Port Result Date:  11/27/2023 CLINICAL DATA:  Left shoulder pain. EXAM: LEFT SHOULDER COMPARISON:  Radiograph 10/14/2023 FINDINGS: Mildly displaced fracture involves the lateral humeral head. This is in the region of the rotator cuff insertion. No frank glenohumeral dislocation. The acromioclavicular joint is congruent with unchanged mild degenerative spurring. IMPRESSION: Mildly displaced fracture involving the lateral humeral head. Electronically Signed   By: Narda Rutherford M.D.   On: 11/27/2023 13:47   MR BRAIN WO CONTRAST Result Date: 11/24/2023 CLINICAL DATA:  Seizure, new onset, history of trauma. EXAM: MRI HEAD WITHOUT CONTRAST TECHNIQUE: Multiplanar, multiecho pulse sequences of the brain and surrounding structures were obtained without intravenous contrast. COMPARISON:  Head CT November 23, 2023. FINDINGS: The study is partially degraded by motion. Brain: Subtle asymmetric hyperintensity of the thalamic pulvinar on the FLAIR sequences, more evident on the right side periaqueductal hyperintensity on coronal FLAIR. Moderate for age diffuse parenchymal volume loss. No acute infarction, hemorrhage, hydrocephalus, extra-axial collection or mass lesion. A few scattered foci of T2 hyperintensity are seen within the white matter of the cerebral hemispheres, nonspecific. Hippocampal show normal signal characteristics. Vascular: Normal flow voids. Skull and upper cervical spine: Normal marrow signal. Sinuses/Orbits: Mild mucosal thickening throughout the paranasal sinuses and mastoids. The orbits are maintained. Other: None. IMPRESSION: 1. Subtle asymmetric hyperintensity of the thalamic pulvinar on the FLAIR sequences, more evident on the right side periaqueductal hyperintensity on coronal FLAIR. Findings are nonspecific but can be seen in the setting of Wernicke's encephalopathy. 2. Moderate for age diffuse parenchymal volume loss. Electronically Signed   By: Baldemar Lenis M.D.   On: 11/24/2023 11:21   Overnight  EEG with video Result Date: 11/24/2023 Charlsie Quest, MD     11/25/2023  8:33 AM Patient Name: Alexandra Dorsey MRN: [id number] Epilepsy Attending: Charlsie Quest Referring Physician/Provider: Erick Blinks, MD Duration: 11/23/2023 1630 to 11/24/2023 1830 Patient history: 48 y.o. female with  hx of EtOh use, EtOh withdrawal seizures, gout, HTN, who was brought in to the ED unresponsive by EMS. Has had 3 seizure today and intubated now. EEG to evaluate for seizure Level of alertness:  comatose AEDs during EEG study: Propofol Technical aspects: This EEG study was done with scalp electrodes positioned according to the 10-20 International system of electrode placement. Electrical activity was reviewed with band pass filter of 1-70Hz , sensitivity of 7 uV/mm, display speed of 44mm/sec with a 60Hz  notched filter applied as appropriate. EEG data were recorded continuously and digitally stored.  Video monitoring was available and reviewed as appropriate. Description: EEG showed continuous generalized 3 to 6 Hz theta-delta slowing admixed with 12-14Hz  beta activity distributed symmetrically and diffusely. Hyperventilation and photic stimulation were not performed.   EEG was disconnected for testing on 11/24/2023 between 0944 to 1125 Parts of eeg were difficult to interpret due to significant electrode artifact. ABNORMALITY - Continuous slow, generalized IMPRESSION: This study is suggestive of moderate diffuse encephalopathy likely related to sedation. No seizures or definite epileptiform discharges were seen throughout the recording. Charlsie Quest   DG Abd 1 View Result Date: 11/23/2023 CLINICAL DATA:  OG tube placement EXAM: ABDOMEN - 1 VIEW COMPARISON:  04/25/2021 FINDINGS: Limited field of view for tube placement verification. An enteric tube has been placed with tip projecting over the left upper quadrant consistent with location in the body of the stomach. Visualized bowel gas pattern is normal. Lung bases are  clear. Healing fracture of the posterior right ninth rib. IMPRESSION: Enteric tube tip projects over the left upper quadrant consistent with location in the body of the stomach. Electronically Signed   By: Burman Nieves M.D.   On: 11/23/2023 20:19   CT Head Wo Contrast Result Date: 11/23/2023 CLINICAL DATA:  Mental status change, unknown cause EXAM: CT HEAD WITHOUT CONTRAST TECHNIQUE: Contiguous axial images were obtained from the base of the skull through the vertex without intravenous contrast. RADIATION DOSE REDUCTION: This exam was performed according to the departmental dose-optimization program which includes automated exposure control, adjustment of the mA and/or kV according to patient size and/or use of iterative reconstruction technique. COMPARISON:  CT head June 4, 23. FINDINGS: Brain: No evidence of acute infarction, hemorrhage, hydrocephalus, extra-axial collection or mass lesion/mass effect. Vascular: No hyperdense vessel. Skull: No acute fracture. Sinuses/Orbits: Clear sinuses.  No acute orbital findings. Other: No mastoid effusions. IMPRESSION: No evidence of acute intracranial abnormality. Electronically Signed   By: Feliberto Harts M.D.   On: 11/23/2023 16:16   DG Chest Port 1 View Result Date: 11/23/2023 CLINICAL DATA:  Seizure status post intubation EXAM: PORTABLE CHEST 1 VIEW COMPARISON:  Chest radiograph dated 10/14/2023 FINDINGS: Lines/tubes: Endotracheal tube tip projects 3.4 cm above the carina. Enteric tube tip reaches the diaphragm and terminates below the field of view. Lungs: Mildly low lung volumes.  Dense left retrocardiac opacity. Pleura: Questionable blunting of the left costophrenic angle. No pneumothorax. Heart/mediastinum: The heart size and mediastinal contours are within normal limits. Bones: No acute osseous abnormality. IMPRESSION: 1. Endotracheal tube tip projects 3.4 cm above the carina. 2. Dense left retrocardiac opacity, likely atelectasis. Aspiration or pneumonia  can be considered in the appropriate clinical setting. 3. Questionable blunting of the left costophrenic angle, which may represent a small pleural effusion. Electronically Signed   By: Agustin Cree M.D.   On: 11/23/2023 14:43    Microbiology: Results for orders placed or performed during the hospital encounter of 11/23/23  Resp panel by  RT-PCR (RSV, Flu A&B, Covid) Anterior Nasal Swab     Status: None   Collection Time: 11/23/23  1:56 PM   Specimen: Anterior Nasal Swab  Result Value Ref Range Status   SARS Coronavirus 2 by RT PCR NEGATIVE NEGATIVE Final   Influenza A by PCR NEGATIVE NEGATIVE Final   Influenza B by PCR NEGATIVE NEGATIVE Final    Comment: (NOTE) The Xpert Xpress SARS-CoV-2/FLU/RSV plus assay is intended as an aid in the diagnosis of influenza from Nasopharyngeal swab specimens and should not be used as a sole basis for treatment. Nasal washings and aspirates are unacceptable for Xpert Xpress SARS-CoV-2/FLU/RSV testing.  Fact Sheet for Patients: BloggerCourse.com  Fact Sheet for Healthcare Providers: SeriousBroker.it  This test is not yet approved or cleared by the [place] FDA and has been authorized for detection and/or diagnosis of SARS-CoV-2 by FDA under an Emergency Use Authorization (EUA). This EUA will remain in effect (meaning this test can be used) for the duration of the COVID-19 declaration under Section 564(b)(1) of the Act, 21 U.S.C. section 360bbb-3(b)(1), unless the authorization is terminated or revoked.     Resp Syncytial Virus by PCR NEGATIVE NEGATIVE Final    Comment: (NOTE) Fact Sheet for Patients: BloggerCourse.com  Fact Sheet for Healthcare Providers: SeriousBroker.it  This test is not yet approved or cleared by the [place] FDA and has been authorized for detection and/or diagnosis of SARS-CoV-2 by FDA under an Emergency Use  Authorization (EUA). This EUA will remain in effect (meaning this test can be used) for the duration of the COVID-19 declaration under Section 564(b)(1) of the Act, 21 U.S.C. section 360bbb-3(b)(1), unless the authorization is terminated or revoked.  Performed at [facility], [address], [place], [place] [address]   Blood Culture (routine x 2)     Status: None   Collection Time: 11/23/23  2:10 PM   Specimen: BLOOD  Result Value Ref Range Status   Specimen Description BLOOD LEFT ANTECUBITAL  Final   Special Requests   Final    BOTTLES DRAWN AEROBIC AND ANAEROBIC Blood Culture adequate volume   Culture   Final    NO GROWTH 5 DAYS Performed at [facility], [address] [place], [place] [address]    Report Status 11/28/2023 FINAL  Final  Blood Culture (routine x 2)     Status: None   Collection Time: 11/23/23  3:11 PM   Specimen: BLOOD  Result Value Ref Range Status   Specimen Description BLOOD RIGHT ANTECUBITAL  Final   Special Requests   Final    BOTTLES DRAWN AEROBIC AND ANAEROBIC Blood Culture results may not be optimal due to an inadequate volume of blood received in culture bottles   Culture   Final    NO GROWTH 5 DAYS Performed at [facility], [address], [place], [place] [address]    Report Status 11/28/2023 FINAL  Final  MRSA Next Gen by PCR, Nasal     Status: None   Collection Time: 11/23/23  6:28 PM   Specimen: Nasal Mucosa; Nasal Swab  Result Value Ref Range Status   MRSA by PCR Next Gen NOT DETECTED NOT DETECTED Final    Comment: (NOTE) The GeneXpert MRSA Assay (FDA approved for NASAL specimens only), is one component of a comprehensive MRSA colonization surveillance program. It is not intended to diagnose MRSA infection nor to guide or monitor treatment for MRSA infections. Test performance is not FDA approved in patients  less than 82 years old. Performed at [facility], [address] [place], [place] [address]      Labs: CBC: Recent Labs  Lab 11/25/23 [phone] 11/26/23 0332 11/28/23 0558 11/29/23 0622  WBC 8.5 10.6* 11.4* 12.1*  NEUTROABS  --   --   --  8.0*  HGB 10.3* 10.8* 12.1 12.2  HCT 31.0* 30.7* 35.2* 36.4  MCV 103.3* 98.4 100.3* 101.7*  PLT 236 292 380 432*   Basic Metabolic Panel: Recent Labs  Lab 11/25/23 0521 11/26/23 0332 11/28/23 1146 11/29/23 0622  NA 138 134* 135 135  K 4.1 3.5 3.3* 3.4*  CL 101 95* 93* 90*  CO2 22 27 34* 28  GLUCOSE 84 88 81 85  BUN 5* <5* <5* 6  CREATININE 0.68 0.69 0.80 0.75  CALCIUM 8.7* 8.8* 8.5* 9.2  MG 2.2  --   --  1.3*  PHOS  --   --   --  4.7*   Liver Function Tests: Recent Labs  Lab 11/26/23 0332 11/29/23 0622  AST 18 16  ALT 13 9  ALKPHOS 56 54  BILITOT 0.7 0.5  PROT 5.8* 6.3*  ALBUMIN 2.9* 3.3*   CBG: Recent Labs  Lab 11/25/23 2301 11/26/23 0332 11/26/23 0719 11/26/23 1111 11/26/23 1510  GLUCAP 76 89 106* 127* 88    Discharge time spent: greater than 30 minutes.  Signed: Marolyn Haller, MD [facility] 12/01/2023

## 2023-12-04 DIAGNOSIS — F1094 Alcohol use, unspecified with alcohol-induced mood disorder: Secondary | ICD-10-CM | POA: Diagnosis not present

## 2023-12-04 DIAGNOSIS — F331 Major depressive disorder, recurrent, moderate: Secondary | ICD-10-CM | POA: Diagnosis not present

## 2023-12-04 DIAGNOSIS — F411 Generalized anxiety disorder: Secondary | ICD-10-CM | POA: Diagnosis not present

## 2023-12-04 DIAGNOSIS — F5101 Primary insomnia: Secondary | ICD-10-CM | POA: Diagnosis not present

## 2023-12-06 DIAGNOSIS — F1090 Alcohol use, unspecified, uncomplicated: Secondary | ICD-10-CM | POA: Diagnosis not present

## 2023-12-06 DIAGNOSIS — F419 Anxiety disorder, unspecified: Secondary | ICD-10-CM | POA: Diagnosis not present

## 2023-12-06 DIAGNOSIS — F10930 Alcohol use, unspecified with withdrawal, uncomplicated: Secondary | ICD-10-CM | POA: Diagnosis not present

## 2023-12-06 DIAGNOSIS — E876 Hypokalemia: Secondary | ICD-10-CM | POA: Diagnosis not present

## 2023-12-06 DIAGNOSIS — F5101 Primary insomnia: Secondary | ICD-10-CM | POA: Diagnosis not present

## 2023-12-06 DIAGNOSIS — I10 Essential (primary) hypertension: Secondary | ICD-10-CM | POA: Diagnosis not present

## 2023-12-19 ENCOUNTER — Inpatient Hospital Stay (HOSPITAL_COMMUNITY)
Admission: EM | Admit: 2023-12-19 | Discharge: 2023-12-24 | DRG: 092 | Disposition: A | Discharge status: Home / Self Care | Attending: Internal Medicine | Admitting: Internal Medicine

## 2023-12-19 ENCOUNTER — Emergency Department (HOSPITAL_COMMUNITY)

## 2023-12-19 DIAGNOSIS — X088XXA Exposure to other specified smoke, fire and flames, initial encounter: Secondary | ICD-10-CM | POA: Diagnosis present

## 2023-12-19 DIAGNOSIS — F411 Generalized anxiety disorder: Secondary | ICD-10-CM | POA: Diagnosis present

## 2023-12-19 DIAGNOSIS — Z79899 Other long term (current) drug therapy: Secondary | ICD-10-CM | POA: Diagnosis not present

## 2023-12-19 DIAGNOSIS — Y92030 Kitchen in apartment as the place of occurrence of the external cause: Secondary | ICD-10-CM | POA: Diagnosis not present

## 2023-12-19 DIAGNOSIS — Z888 Allergy status to other drugs, medicaments and biological substances status: Secondary | ICD-10-CM

## 2023-12-19 DIAGNOSIS — E039 Hypothyroidism, unspecified: Secondary | ICD-10-CM | POA: Diagnosis present

## 2023-12-19 DIAGNOSIS — F1010 Alcohol abuse, uncomplicated: Secondary | ICD-10-CM | POA: Diagnosis present

## 2023-12-19 DIAGNOSIS — F1090 Alcohol use, unspecified, uncomplicated: Secondary | ICD-10-CM | POA: Diagnosis present

## 2023-12-19 DIAGNOSIS — Z79890 Hormone replacement therapy (postmenopausal): Secondary | ICD-10-CM

## 2023-12-19 DIAGNOSIS — W19XXXA Unspecified fall, initial encounter: Secondary | ICD-10-CM | POA: Diagnosis present

## 2023-12-19 DIAGNOSIS — Z823 Family history of stroke: Secondary | ICD-10-CM

## 2023-12-19 DIAGNOSIS — S42302D Unspecified fracture of shaft of humerus, left arm, subsequent encounter for fracture with routine healing: Secondary | ICD-10-CM

## 2023-12-19 DIAGNOSIS — R918 Other nonspecific abnormal finding of lung field: Secondary | ICD-10-CM | POA: Diagnosis not present

## 2023-12-19 DIAGNOSIS — R4182 Altered mental status, unspecified: Secondary | ICD-10-CM | POA: Diagnosis not present

## 2023-12-19 DIAGNOSIS — F17210 Nicotine dependence, cigarettes, uncomplicated: Secondary | ICD-10-CM | POA: Diagnosis present

## 2023-12-19 DIAGNOSIS — T59811A Toxic effect of smoke, accidental (unintentional), initial encounter: Secondary | ICD-10-CM | POA: Diagnosis present

## 2023-12-19 DIAGNOSIS — S42302A Unspecified fracture of shaft of humerus, left arm, initial encounter for closed fracture: Secondary | ICD-10-CM | POA: Diagnosis present

## 2023-12-19 DIAGNOSIS — Z8249 Family history of ischemic heart disease and other diseases of the circulatory system: Secondary | ICD-10-CM

## 2023-12-19 DIAGNOSIS — R000 Tachycardia, unspecified: Secondary | ICD-10-CM | POA: Diagnosis not present

## 2023-12-19 DIAGNOSIS — G928 Other toxic encephalopathy: Secondary | ICD-10-CM | POA: Diagnosis present

## 2023-12-19 DIAGNOSIS — R569 Unspecified convulsions: Secondary | ICD-10-CM | POA: Diagnosis not present

## 2023-12-19 DIAGNOSIS — F32A Depression, unspecified: Secondary | ICD-10-CM | POA: Diagnosis present

## 2023-12-19 DIAGNOSIS — R9431 Abnormal electrocardiogram [ECG] [EKG]: Secondary | ICD-10-CM | POA: Diagnosis present

## 2023-12-19 DIAGNOSIS — W19XXXD Unspecified fall, subsequent encounter: Secondary | ICD-10-CM | POA: Diagnosis present

## 2023-12-19 DIAGNOSIS — Y900 Blood alcohol level of less than 20 mg/100 ml: Secondary | ICD-10-CM | POA: Diagnosis present

## 2023-12-19 DIAGNOSIS — R410 Disorientation, unspecified: Secondary | ICD-10-CM | POA: Diagnosis not present

## 2023-12-19 DIAGNOSIS — I10 Essential (primary) hypertension: Secondary | ICD-10-CM | POA: Diagnosis present

## 2023-12-19 DIAGNOSIS — E871 Hypo-osmolality and hyponatremia: Secondary | ICD-10-CM | POA: Diagnosis present

## 2023-12-19 DIAGNOSIS — F10930 Alcohol use, unspecified with withdrawal, uncomplicated: Secondary | ICD-10-CM | POA: Diagnosis not present

## 2023-12-19 DIAGNOSIS — M109 Gout, unspecified: Secondary | ICD-10-CM | POA: Diagnosis present

## 2023-12-19 DIAGNOSIS — G9341 Metabolic encephalopathy: Secondary | ICD-10-CM | POA: Diagnosis present

## 2023-12-19 LAB — COMPREHENSIVE METABOLIC PANEL
ALT: 12 U/L (ref 0–44)
AST: 23 U/L (ref 15–41)
Albumin: 4.3 g/dL (ref 3.5–5.0)
Alkaline Phosphatase: 56 U/L (ref 38–126)
Anion gap: 13 (ref 5–15)
BUN: <5 mg/dL — ABNORMAL LOW (ref 6–20)
CO2: 22 mmol/L (ref 22–32)
Calcium: 10.3 mg/dL (ref 8.9–10.3)
Chloride: 97 mmol/L — ABNORMAL LOW (ref 98–111)
Creatinine, Ser: 0.59 mg/dL (ref 0.44–1.00)
GFR, Estimated: >60 mL/min (ref >60)
Glucose, Bld: 124 mg/dL — ABNORMAL HIGH (ref 70–99)
Potassium: 4.7 mmol/L (ref 3.5–5.1)
Sodium: 132 mmol/L — ABNORMAL LOW (ref 135–145)
Total Bilirubin: 0.7 mg/dL (ref 0.0–1.2)
Total Protein: 7.5 g/dL (ref 6.5–8.1)

## 2023-12-19 LAB — MAGNESIUM: Magnesium: 1.5 mg/dL — ABNORMAL LOW (ref 1.7–2.4)

## 2023-12-19 LAB — BLOOD GAS, VENOUS
Acid-Base Excess: 0.7 mmol/L (ref 0.0–2.0)
Bicarbonate: 25.4 mmol/L (ref 20.0–28.0)
O2 Saturation: 75.4 %
Patient temperature: 37
pCO2, Ven: 40 mmHg — ABNORMAL LOW (ref 44–60)
pH, Ven: 7.41 (ref 7.25–7.43)
pO2, Ven: 44 mmHg (ref 32–45)

## 2023-12-19 LAB — CBC
HCT: 41.5 % (ref 36.0–46.0)
Hemoglobin: 13.1 g/dL (ref 12.0–15.0)
MCH: 32.9 pg (ref 26.0–34.0)
MCHC: 31.6 g/dL (ref 30.0–36.0)
MCV: 104.3 fL — ABNORMAL HIGH (ref 80.0–100.0)
Platelets: 287 10*3/uL (ref 150–400)
RBC: 3.98 MIL/uL (ref 3.87–5.11)
RDW: 15.1 % (ref 11.5–15.5)
WBC: 9.7 10*3/uL (ref 4.0–10.5)
nRBC: 0 % (ref 0.0–0.2)

## 2023-12-19 LAB — CBG MONITORING, ED: Glucose-Capillary: 120 mg/dL — ABNORMAL HIGH (ref 70–99)

## 2023-12-19 LAB — CARBOXYHEMOGLOBIN - COOX
Carboxyhemoglobin: 3.7 % — ABNORMAL HIGH (ref 0.5–1.5)
Carboxyhemoglobin: 2.7 % — ABNORMAL HIGH (ref 0.5–1.5)

## 2023-12-19 LAB — TSH: TSH: 8.168 u[IU]/mL — ABNORMAL HIGH (ref 0.350–4.500)

## 2023-12-19 LAB — ETHANOL: Alcohol, Ethyl (B): <10 mg/dL

## 2023-12-19 LAB — VITAMIN B12: Vitamin B-12: 180 pg/mL (ref 180–914)

## 2023-12-19 LAB — AMMONIA: Ammonia: 24 umol/L (ref 9–35)

## 2023-12-19 LAB — CK: Total CK: 106 U/L (ref 38–234)

## 2023-12-19 LAB — LACTIC ACID, PLASMA: Lactic Acid, Venous: 2.4 mmol/L (ref 0.5–1.9)

## 2023-12-19 LAB — HCG, SERUM, QUALITATIVE: Preg, Serum: NEGATIVE

## 2023-12-19 MED ORDER — LORAZEPAM 2 MG/ML IJ SOLN
1.0000 mg | INTRAMUSCULAR | Status: AC | PRN
  Administered 2023-12-19: 4 mg via INTRAVENOUS
  Administered 2023-12-20: 2 mg via INTRAVENOUS
  Administered 2023-12-20 – 2023-12-21 (×2): 4 mg via INTRAVENOUS
  Administered 2023-12-21: 2 mg via INTRAVENOUS
  Filled 2023-12-19 (×4): qty 2
  Filled 2023-12-19: qty 1

## 2023-12-19 MED ORDER — ALPRAZOLAM 0.25 MG PO TABS
0.2500 mg | ORAL_TABLET | Freq: Two times a day (BID) | ORAL | Status: DC
  Administered 2023-12-20 – 2023-12-24 (×9): 0.25 mg via ORAL
  Filled 2023-12-19 (×9): qty 1

## 2023-12-19 MED ORDER — BUSPIRONE HCL 5 MG PO TABS
5.0000 mg | ORAL_TABLET | Freq: Three times a day (TID) | ORAL | Status: DC
  Administered 2023-12-20 – 2023-12-24 (×12): 5 mg via ORAL
  Filled 2023-12-19 (×12): qty 1

## 2023-12-19 MED ORDER — ENOXAPARIN SODIUM 40 MG/0.4ML IJ SOSY
40.0000 mg | PREFILLED_SYRINGE | INTRAMUSCULAR | Status: DC
  Administered 2023-12-19 – 2023-12-23 (×5): 40 mg via SUBCUTANEOUS
  Filled 2023-12-19 (×5): qty 0.4

## 2023-12-19 MED ORDER — SODIUM CHLORIDE 0.9 % IV SOLN: INTRAVENOUS | Status: AC

## 2023-12-19 MED ORDER — SODIUM CHLORIDE 0.9 % IV BOLUS
500.0000 mL | Freq: Once | INTRAVENOUS | Status: AC
  Administered 2023-12-19: 500 mL via INTRAVENOUS

## 2023-12-19 MED ORDER — LORAZEPAM 2 MG/ML IJ SOLN: 0.0000 mg | Freq: Two times a day (BID) | INTRAMUSCULAR | Status: DC

## 2023-12-19 MED ORDER — LORAZEPAM 1 MG PO TABS
1.0000 mg | ORAL_TABLET | ORAL | Status: AC | PRN
  Administered 2023-12-20 – 2023-12-22 (×3): 1 mg via ORAL
  Filled 2023-12-19 (×3): qty 1

## 2023-12-19 MED ORDER — LORAZEPAM 2 MG/ML IJ SOLN
0.0000 mg | Freq: Four times a day (QID) | INTRAMUSCULAR | Status: AC
  Administered 2023-12-19: 2 mg via INTRAVENOUS
  Administered 2023-12-20: 4 mg via INTRAVENOUS
  Administered 2023-12-20: 2 mg via INTRAVENOUS
  Administered 2023-12-21: 3 mg via INTRAVENOUS
  Filled 2023-12-19 (×2): qty 1
  Filled 2023-12-19: qty 2
  Filled 2023-12-19: qty 1

## 2023-12-19 MED ORDER — ONDANSETRON HCL 4 MG PO TABS: 4.0000 mg | ORAL_TABLET | Freq: Four times a day (QID) | ORAL | Status: DC | PRN

## 2023-12-19 MED ORDER — QUETIAPINE FUMARATE 100 MG PO TABS
200.0000 mg | ORAL_TABLET | Freq: Every day | ORAL | Status: DC
  Administered 2023-12-20 – 2023-12-23 (×4): 200 mg via ORAL
  Filled 2023-12-19 (×4): qty 2

## 2023-12-19 MED ORDER — MAGNESIUM SULFATE 2 GM/50ML IV SOLN
2.0000 g | Freq: Once | INTRAVENOUS | Status: AC
  Administered 2023-12-19: 2 g via INTRAVENOUS
  Filled 2023-12-19: qty 50

## 2023-12-19 MED ORDER — LORAZEPAM 1 MG PO TABS
0.0000 mg | ORAL_TABLET | Freq: Two times a day (BID) | ORAL | Status: DC
  Administered 2023-12-21: 1 mg via ORAL
  Filled 2023-12-19: qty 1

## 2023-12-19 MED ORDER — ACETAMINOPHEN 325 MG PO TABS
650.0000 mg | ORAL_TABLET | Freq: Four times a day (QID) | ORAL | Status: DC | PRN
  Administered 2023-12-21 (×2): 650 mg via ORAL
  Filled 2023-12-19 (×3): qty 2

## 2023-12-19 MED ORDER — ACETAMINOPHEN 650 MG RE SUPP: 650.0000 mg | Freq: Four times a day (QID) | RECTAL | Status: DC | PRN

## 2023-12-19 MED ORDER — MAGNESIUM OXIDE -MG SUPPLEMENT 400 (240 MG) MG PO TABS
400.0000 mg | ORAL_TABLET | Freq: Every day | ORAL | Status: DC
  Administered 2023-12-19 – 2023-12-24 (×6): 400 mg via ORAL
  Filled 2023-12-19 (×6): qty 1

## 2023-12-19 MED ORDER — LORAZEPAM 1 MG PO TABS
0.0000 mg | ORAL_TABLET | Freq: Four times a day (QID) | ORAL | Status: AC
  Administered 2023-12-19: 2 mg via ORAL
  Administered 2023-12-21: 1 mg via ORAL
  Filled 2023-12-19: qty 1
  Filled 2023-12-19: qty 2

## 2023-12-19 MED ORDER — ONDANSETRON HCL 4 MG/2ML IJ SOLN: 4.0000 mg | Freq: Four times a day (QID) | INTRAMUSCULAR | Status: DC | PRN

## 2023-12-19 MED ORDER — NICOTINE 7 MG/24HR TD PT24
7.0000 mg | MEDICATED_PATCH | Freq: Every day | TRANSDERMAL | Status: DC
  Administered 2023-12-19 – 2023-12-24 (×6): 7 mg via TRANSDERMAL
  Filled 2023-12-19 (×6): qty 1

## 2023-12-19 MED ORDER — CYANOCOBALAMIN 1000 MCG/ML IJ SOLN
1000.0000 ug | Freq: Every day | INTRAMUSCULAR | Status: AC
  Administered 2023-12-20 – 2023-12-24 (×5): 1000 ug via INTRAMUSCULAR
  Filled 2023-12-19 (×5): qty 1

## 2023-12-19 MED ORDER — THIAMINE HCL 100 MG/ML IJ SOLN
100.0000 mg | Freq: Once | INTRAMUSCULAR | Status: AC
  Administered 2023-12-19: 100 mg via INTRAVENOUS
  Filled 2023-12-19: qty 2

## 2023-12-19 MED ORDER — SODIUM CHLORIDE 0.9 % IV BOLUS
1000.0000 mL | Freq: Once | INTRAVENOUS | Status: AC
  Administered 2023-12-19: 1000 mL via INTRAVENOUS

## 2023-12-19 MED ORDER — LEVOTHYROXINE SODIUM 25 MCG PO TABS
25.0000 ug | ORAL_TABLET | Freq: Every day | ORAL | Status: DC
  Administered 2023-12-20: 25 ug via ORAL
  Filled 2023-12-19: qty 1

## 2023-12-19 NOTE — Assessment & Plan Note (Addendum)
 Recent admit requiring intubation for seizures thought secondary to alcohol withdrawal  No reported seizure activity at home or here  No AED Seizure precautions
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
## 2023-12-19 NOTE — Assessment & Plan Note (Signed)
 Mild hyponatremia IVF overnight  Trend

## 2023-12-19 NOTE — Assessment & Plan Note (Addendum)
 Continue buspar and xanax (med rec not done yet)
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
## 2023-12-19 NOTE — Assessment & Plan Note (Addendum)
 Magnesium of 1.5, repleted in ED Trend and start oral supplement
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
## 2023-12-19 NOTE — Progress Notes (Signed)
 Notified by bedside nurse, Trish Fountain, RN that patient's CIWA score is 20 and as prescribed will receive 4 mg Ativan. Writer will continue to monitor virtually and Nurse Tresa Endo will update rapid nurse as needed.

## 2023-12-19 NOTE — Assessment & Plan Note (Signed)
 Blood pressure normotensive and her amlodipine held at discharge on 11/29/23 Continues to be stable, monitor off medication

## 2023-12-19 NOTE — Assessment & Plan Note (Signed)
 TSH consisently elevated Repeat today, increase meds if needed
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
## 2023-12-20 DIAGNOSIS — G9341 Metabolic encephalopathy: Secondary | ICD-10-CM | POA: Diagnosis not present

## 2023-12-20 LAB — BASIC METABOLIC PANEL
Anion gap: 11 (ref 5–15)
BUN: <5 mg/dL — ABNORMAL LOW (ref 6–20)
CO2: 23 mmol/L (ref 22–32)
Calcium: 8.7 mg/dL — ABNORMAL LOW (ref 8.9–10.3)
Chloride: 101 mmol/L (ref 98–111)
Creatinine, Ser: 0.62 mg/dL (ref 0.44–1.00)
GFR, Estimated: >60 mL/min (ref >60)
Glucose, Bld: 90 mg/dL (ref 70–99)
Potassium: 4 mmol/L (ref 3.5–5.1)
Sodium: 135 mmol/L (ref 135–145)

## 2023-12-20 LAB — CBC
HCT: 35.8 % — ABNORMAL LOW (ref 36.0–46.0)
Hemoglobin: 11.3 g/dL — ABNORMAL LOW (ref 12.0–15.0)
MCH: 33.3 pg (ref 26.0–34.0)
MCHC: 31.6 g/dL (ref 30.0–36.0)
MCV: 105.6 fL — ABNORMAL HIGH (ref 80.0–100.0)
Platelets: 273 10*3/uL (ref 150–400)
RBC: 3.39 MIL/uL — ABNORMAL LOW (ref 3.87–5.11)
RDW: 15.3 % (ref 11.5–15.5)
WBC: 6.3 10*3/uL (ref 4.0–10.5)
nRBC: 0 % (ref 0.0–0.2)

## 2023-12-20 LAB — MAGNESIUM: Magnesium: 1.7 mg/dL (ref 1.7–2.4)

## 2023-12-20 MED ORDER — FOLIC ACID 1 MG PO TABS
1.0000 mg | ORAL_TABLET | Freq: Every day | ORAL | Status: DC
  Administered 2023-12-21 – 2023-12-24 (×4): 1 mg via ORAL
  Filled 2023-12-20 (×4): qty 1

## 2023-12-20 MED ORDER — THIAMINE HCL 100 MG PO TABS
100.0000 mg | ORAL_TABLET | Freq: Every day | ORAL | Status: DC
  Filled 2023-12-20: qty 1

## 2023-12-20 MED ORDER — THIAMINE MONONITRATE 100 MG PO TABS
100.0000 mg | ORAL_TABLET | Freq: Every day | ORAL | Status: DC
  Administered 2023-12-21 – 2023-12-24 (×4): 100 mg via ORAL
  Filled 2023-12-20 (×4): qty 1

## 2023-12-20 MED ORDER — LEVOTHYROXINE SODIUM 100 MCG PO TABS
100.0000 ug | ORAL_TABLET | Freq: Every day | ORAL | Status: DC
  Administered 2023-12-21 – 2023-12-24 (×4): 100 ug via ORAL
  Filled 2023-12-20 (×4): qty 1
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
## 2023-12-20 NOTE — TOC Initial Note (Signed)
 Transition of Care [facility]) - Initial/Assessment Note    Patient Details  Name: Alexandra Dorsey MRN: [id number] Date of Birth: [birth year]  [facility] of [facility] CM/SW Contact:    Lanier Clam, RN Phone Number: 12/20/2023, 10:46 AM  Clinical Narrative:d/c plan home. SA resources added to AVS. NW:GNFA.                   Expected Discharge Plan: Home/Self Care Barriers to Discharge: Continued Medical Work up   Patient Goals and CMS Choice Patient states their goals for this hospitalization and ongoing recovery are:: Home CMS Medicare.gov Compare Post Acute Care list provided to:: Patient Choice offered to / list presented to : Patient [place] ownership interest in [facility].provided to:: Patient    Expected Discharge Plan and Services                                              Prior Living Arrangements/Services                       Activities of Daily Living      Permission Sought/Granted                  Emotional Assessment              Admission diagnosis:  Altered mental status, unspecified altered mental status type [R41.82] Acute metabolic encephalopathy [G93.41] Patient Active Problem List   Diagnosis Date Noted   Acute metabolic encephalopathy 12/19/2023   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 11/23/2023   Drug overdose, accidental or unintentional, initial encounter 09/11/2023   Hyponatremia 09/11/2023   Chronic alcohol use 09/11/2023   Tetrahydrocannabinol (THC) use disorder, mild, abuse 09/11/2023   Leukocytosis 09/11/2023   Generalized anxiety disorder 09/11/2023   Hypothyroidism 09/11/2023   Anxiety    Hypophosphatemia    Nausea & vomiting 04/25/2021   Hypomagnesemia 04/25/2021   Transaminitis 04/25/2021   Nausea, vomiting and diarrhea    Altered mental status 06/06/2017   Alcohol dependence with withdrawal with complication (HCC)    Lactic acidosis    Non-intractable vomiting with nausea  05/29/2017   Sore throat 05/29/2017   Essential hypertension 11/13/2016   Panic attacks 11/13/2016   PCP:  Wilfrid Lund, PA Pharmacy:   [facility] [id number] - [place], [place] - [address] [place] [address] Phone: [phone] Fax: [phone]  [facility] [address] [place] [address] Phone: [phone] Fax: [phone]  [facility] [address] [place] [address] Phone: [phone] Fax: [phone]     Social Drivers of Health (SDOH) Social History: SDOH Screenings   Food Insecurity: Patient Unable To Answer (12/19/2023)  Housing: Patient Unable To Answer (12/19/2023)  Transportation Needs: Patient Unable To Answer (12/19/2023)  Utilities: Patient Unable To Answer (12/19/2023)  Tobacco Use: Medium Risk (11/23/2023)   SDOH Interventions:     Readmission Risk Interventions    11/28/2023    3:51 PM 11/16/2023   10:45 AM  Readmission Risk Prevention Plan  Transportation Screening Complete Complete  HRI or Home Care Consult  Complete  Social Work Consult for Recovery Care Planning/Counseling  Complete  Palliative Care Screening  Not Applicable  Medication Review [employer]) Complete Complete  PCP or  Specialist appointment within 3-5 days of discharge Complete   HRI or Home Care Consult Complete   SW Recovery Care/Counseling Consult Complete   Palliative Care Screening Not Applicable   Skilled Nursing Facility Complete

## 2023-12-20 NOTE — Consult Note (Signed)
 [facility]   Patient Name: [name]  MRN: [id number]  DOB: [birth year]  Consult Order details:  Orders (From admission, onward)     Start     Ordered   12/19/23 1933  IP CONSULT TO PSYCHIATRY       Ordering Provider: Orland Mustard, MD  Provider:  (Not yet assigned)  Question Answer Comment  Location [facility]   Reason for Consult? confusion/hallucinations. father says seeing psychiatrist. history of mental illness.      12/19/23 1933             Mode of Visit: In person  Attempted to see. Patient had just received Ativan and was not arousable.  Psychiatry will reattempt consult on 12/21/2023.    Mariel Craft, MD

## 2023-12-21 ENCOUNTER — Encounter (HOSPITAL_COMMUNITY): Payer: Self-pay | Admitting: Family Medicine

## 2023-12-21 ENCOUNTER — Other Ambulatory Visit: Payer: Self-pay

## 2023-12-21 DIAGNOSIS — G9341 Metabolic encephalopathy: Secondary | ICD-10-CM | POA: Diagnosis not present

## 2023-12-21 LAB — COMPREHENSIVE METABOLIC PANEL
ALT: 11 U/L (ref 0–44)
AST: 24 U/L (ref 15–41)
Albumin: 3.5 g/dL (ref 3.5–5.0)
Alkaline Phosphatase: 48 U/L (ref 38–126)
Anion gap: 12 (ref 5–15)
BUN: <5 mg/dL — ABNORMAL LOW (ref 6–20)
CO2: 26 mmol/L (ref 22–32)
Calcium: 9.2 mg/dL (ref 8.9–10.3)
Chloride: 100 mmol/L (ref 98–111)
Creatinine, Ser: 0.53 mg/dL (ref 0.44–1.00)
GFR, Estimated: >60 mL/min (ref >60)
Glucose, Bld: 92 mg/dL (ref 70–99)
Potassium: 3.5 mmol/L (ref 3.5–5.1)
Sodium: 138 mmol/L (ref 135–145)
Total Bilirubin: 0.5 mg/dL (ref 0.0–1.2)
Total Protein: 5.9 g/dL — ABNORMAL LOW (ref 6.5–8.1)

## 2023-12-21 LAB — CBC
HCT: 37.8 % (ref 36.0–46.0)
Hemoglobin: 12 g/dL (ref 12.0–15.0)
MCH: 33.6 pg (ref 26.0–34.0)
MCHC: 31.7 g/dL (ref 30.0–36.0)
MCV: 105.9 fL — ABNORMAL HIGH (ref 80.0–100.0)
Platelets: 287 10*3/uL (ref 150–400)
RBC: 3.57 MIL/uL — ABNORMAL LOW (ref 3.87–5.11)
RDW: 14.8 % (ref 11.5–15.5)
WBC: 7 10*3/uL (ref 4.0–10.5)
nRBC: 0 % (ref 0.0–0.2)

## 2023-12-21 MED ORDER — BOOST / RESOURCE BREEZE PO LIQD CUSTOM
1.0000 | Freq: Three times a day (TID) | ORAL | Status: DC
  Administered 2023-12-21 – 2023-12-23 (×6): 1 via ORAL

## 2023-12-21 MED ORDER — ORAL CARE MOUTH RINSE: 15.0000 mL | OROMUCOSAL | Status: DC | PRN
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
## 2023-12-21 NOTE — Progress Notes (Signed)
 Pt attempted to get out of bed through the night and pulled out  IV . Scheduled ativan and PRN ativan given. Pt had 3 loose stools last night but pt stated having loose stools are normal. A&O 1-2. Most of time pt was confused but was oriented  sometimes and follow commands.

## 2023-12-21 NOTE — Plan of Care (Signed)
  Problem: Education: Goal: Knowledge of General Education information will improve Description: Including pain rating scale, medication(s)/side effects and non-pharmacologic comfort measures Outcome: Not Progressing   Problem: Health Behavior/Discharge Planning: Goal: Ability to manage health-related needs will improve Outcome: Not Progressing   Problem: Clinical Measurements: Goal: Ability to maintain clinical measurements within normal limits will improve Outcome: Not Progressing Goal: Will remain free from infection Outcome: Not Progressing   Problem: Nutrition: Goal: Adequate nutrition will be maintained Outcome: Not Progressing   Problem: Coping: Goal: Level of anxiety will decrease Outcome: Not Progressing

## 2023-12-22 DIAGNOSIS — F10930 Alcohol use, unspecified with withdrawal, uncomplicated: Secondary | ICD-10-CM

## 2023-12-22 DIAGNOSIS — G9341 Metabolic encephalopathy: Secondary | ICD-10-CM | POA: Diagnosis not present
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
## 2023-12-23 DIAGNOSIS — G9341 Metabolic encephalopathy: Secondary | ICD-10-CM | POA: Diagnosis not present

## 2023-12-23 LAB — CBC
HCT: 38.4 % (ref 36.0–46.0)
Hemoglobin: 12.8 g/dL (ref 12.0–15.0)
MCH: 34 pg (ref 26.0–34.0)
MCHC: 33.3 g/dL (ref 30.0–36.0)
MCV: 102.1 fL — ABNORMAL HIGH (ref 80.0–100.0)
Platelets: 302 10*3/uL (ref 150–400)
RBC: 3.76 MIL/uL — ABNORMAL LOW (ref 3.87–5.11)
RDW: 14.5 % (ref 11.5–15.5)
WBC: 7.8 10*3/uL (ref 4.0–10.5)
nRBC: 0 % (ref 0.0–0.2)

## 2023-12-23 LAB — COMPREHENSIVE METABOLIC PANEL
ALT: 12 U/L (ref 0–44)
AST: 17 U/L (ref 15–41)
Albumin: 3.5 g/dL (ref 3.5–5.0)
Alkaline Phosphatase: 50 U/L (ref 38–126)
Anion gap: 11 (ref 5–15)
BUN: 14 mg/dL (ref 6–20)
CO2: 25 mmol/L (ref 22–32)
Calcium: 9 mg/dL (ref 8.9–10.3)
Chloride: 99 mmol/L (ref 98–111)
Creatinine, Ser: 0.63 mg/dL (ref 0.44–1.00)
GFR, Estimated: >60 mL/min (ref >60)
Glucose, Bld: 104 mg/dL — ABNORMAL HIGH (ref 70–99)
Potassium: 3.4 mmol/L — ABNORMAL LOW (ref 3.5–5.1)
Sodium: 135 mmol/L (ref 135–145)
Total Bilirubin: 0.2 mg/dL (ref 0.0–1.2)
Total Protein: 6.3 g/dL — ABNORMAL LOW (ref 6.5–8.1)

## 2023-12-23 NOTE — Plan of Care (Signed)
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
## 2023-12-23 NOTE — Progress Notes (Signed)
 1610 Pt requested pain medication for shoulder pain. Offered PRN Tylenol, Pt refused. Will continue to monitor

## 2023-12-23 NOTE — Plan of Care (Signed)
 ?  Problem: Clinical Measurements: ?Goal: Will remain free from infection ?Outcome: Progressing ?  ?Problem: Clinical Measurements: ?Goal: Diagnostic test results will improve ?Outcome: Progressing ?  ?

## 2023-12-24 DIAGNOSIS — G9341 Metabolic encephalopathy: Secondary | ICD-10-CM | POA: Diagnosis not present

## 2023-12-24 MED ORDER — BUSPIRONE HCL 10 MG PO TABS: 10.0000 mg | ORAL_TABLET | Freq: Three times a day (TID) | ORAL | 0 refills | Status: AC

## 2023-12-24 MED ORDER — COLCHICINE 0.6 MG PO TABS: 0.3000 mg | ORAL_TABLET | Freq: Every day | ORAL | 0 refills | Status: AC

## 2023-12-24 MED ORDER — QUETIAPINE FUMARATE 200 MG PO TABS: 200.0000 mg | ORAL_TABLET | Freq: Every day | ORAL | 0 refills | Status: AC

## 2023-12-24 MED ORDER — LURASIDONE HCL 20 MG PO TABS: 20.0000 mg | ORAL_TABLET | Freq: Every day | ORAL | 0 refills | Status: AC

## 2023-12-24 MED ORDER — DICLOFENAC SODIUM 1 % EX GEL: 2.0000 g | Freq: Four times a day (QID) | CUTANEOUS | 0 refills | Status: AC

## 2023-12-24 MED ORDER — LEVOTHYROXINE SODIUM 100 MCG PO TABS: 100.0000 ug | ORAL_TABLET | Freq: Every day | ORAL | 0 refills | Status: AC

## 2023-12-24 MED ORDER — FOLIC ACID 1 MG PO TABS: 1.0000 mg | ORAL_TABLET | Freq: Every day | ORAL | 0 refills | Status: AC

## 2023-12-24 MED ORDER — NALTREXONE HCL 50 MG PO TABS: 50.0000 mg | ORAL_TABLET | Freq: Every day | ORAL | 0 refills | Status: AC

## 2023-12-24 MED ORDER — THIAMINE HCL 100 MG PO TABS: 100.0000 mg | ORAL_TABLET | Freq: Every day | ORAL | 0 refills | Status: AC

## 2023-12-24 MED ORDER — ALBUTEROL SULFATE HFA 108 (90 BASE) MCG/ACT IN AERS: 1.0000 | INHALATION_SPRAY | RESPIRATORY_TRACT | 0 refills | Status: AC | PRN

## 2023-12-24 MED ORDER — ALLOPURINOL 300 MG PO TABS: 300.0000 mg | ORAL_TABLET | Freq: Every day | ORAL | 0 refills | Status: AC

## 2023-12-24 NOTE — Progress Notes (Signed)
 Nsg Discharge Note  Admit Date:  12/19/2023 Discharge date: 12/24/2023   Wille Celeste to be D/C'd Home per MD order.  AVS completed.  Patient/caregiver able to verbalize understanding.  Discharge Medication: Allergies as of 12/24/2023       Reactions   Lisinopril Cough   Norvasc [amlodipine Besylate] Other (See Comments)   Feet hurts        Medication List     STOP taking these medications    amLODipine 10 MG tablet Commonly known as: NORVASC       TAKE these medications    acetaminophen 325 MG tablet Commonly known as: TYLENOL Take 2 tablets (650 mg total) by mouth every 6 (six) hours as needed for mild pain (pain score 1-3) (or Fever >/= 101).   albuterol 108 (90 Base) MCG/ACT inhaler Commonly known as: VENTOLIN HFA Inhale 1 puff into the lungs every 4 (four) hours as needed for shortness of breath.   allopurinol 300 MG tablet Commonly known as: Zyloprim Take 1 tablet (300 mg total) by mouth daily.   ALPRAZolam 0.25 MG tablet Commonly known as: XANAX Take 0.25 mg by mouth 2 (two) times daily.   busPIRone 10 MG tablet Commonly known as: BUSPAR Take 1 tablet (10 mg total) by mouth 3 (three) times daily. What changed:  medication strength how much to take   colchicine 0.6 MG tablet Take 0.5 tablets (0.3 mg total) by mouth daily.   diclofenac Sodium 1 % Gel Commonly known as: VOLTAREN Apply 2 g topically 4 (four) times daily. To left shoulder   folic acid 1 MG tablet Commonly known as: FOLVITE Take 1 tablet (1 mg total) by mouth daily.   levothyroxine 100 MCG tablet Commonly known as: SYNTHROID Take 1 tablet (100 mcg total) by mouth daily at 6 (six) AM. Start taking on: December 25, 2023 What changed:  medication strength how much to take when to take this   lurasidone 20 MG Tabs tablet Commonly known as: LATUDA Take 1 tablet (20 mg total) by mouth daily.   naltrexone 50 MG tablet Commonly known as: DEPADE Take 1 tablet (50 mg total) by mouth  daily.   QUEtiapine 200 MG tablet Commonly known as: SEROQUEL Take 1 tablet (200 mg total) by mouth at bedtime.   thiamine 100 MG tablet Commonly known as: VITAMIN B1 Take 1 tablet (100 mg total) by mouth daily.        Discharge Assessment: Vitals:   12/23/23 1929 12/24/23 0538  BP: (!) 104/91 118/82  Pulse: (!) 109 (!) 101  Resp: 16 16  Temp: 98.7 F (37.1 C) 97.8 F (36.6 C)  SpO2: 96% 99%   Skin clean, dry and intact without evidence of skin break down, no evidence of skin tears noted. IV catheter discontinued intact. Site without signs and symptoms of complications - no redness or edema noted at insertion site, patient denies c/o pain - only slight tenderness at site.  Dressing with slight pressure applied.  D/c Instructions-Education: Discharge instructions given to patient/family with verbalized understanding. D/c education completed with patient/family including follow up instructions, medication list, d/c activities limitations if indicated, with other d/c instructions as indicated by MD - patient able to verbalize understanding, all questions fully answered. Patient instructed to return to ED, call 911, or call MD for any changes in condition.  Patient escorted via WC, and D/C home via private auto.  Sedric Guia, Tilford Pillar, RN 12/24/2023 12:03 PM

## 2023-12-24 NOTE — TOC Transition Note (Signed)
 Transition of Care [facility]) - Discharge Note   Patient Details  Name: Alexandra Dorsey MRN: [id number] Date of Birth: November 02, 1975  [facility] of [facility]) CM/SW Contact:  Adrian Prows, RN Phone Number: 12/24/2023, 10:57 AM   Clinical Narrative:    D/C orders received; pt given resources for IP/OP substance abuse counseling, Social Services, Corporate investment banker, and [employer]; resources also placed in d/c instructions; pt will make appt at agencies of choice; no TOC needs.   Final next level of care: Home/Self Care Barriers to Discharge: No Barriers Identified   Patient Goals and CMS Choice Patient states their goals for this hospitalization and ongoing recovery are:: Home CMS Medicare.gov Compare Post Acute Care list provided to:: Patient Choice offered to / list presented to : Patient [place] ownership interest in [facility].provided to:: Patient    Discharge Placement                       Discharge Plan and Services Additional resources added to the After Visit Summary for                                       Social Drivers of Health (SDOH) Interventions SDOH Screenings   Food Insecurity: Patient Unable To Answer (12/19/2023)  Housing: Patient Unable To Answer (12/19/2023)  Transportation Needs: Patient Unable To Answer (12/19/2023)  Utilities: Patient Unable To Answer (12/19/2023)  Tobacco Use: Medium Risk (12/21/2023)     Readmission Risk Interventions    12/20/2023   10:47 AM 11/28/2023    3:51 PM 11/16/2023   10:45 AM  Readmission Risk Prevention Plan  Transportation Screening Complete Complete Complete  HRI or Home Care Consult   Complete  Social Work Consult for Recovery Care Planning/Counseling   Complete  Palliative Care Screening   Not Applicable  Medication Review [employer]) Complete Complete Complete  PCP or Specialist appointment within 3-5 days of discharge Complete Complete   HRI or Home Care Consult  Complete Complete   SW Recovery Care/Counseling Consult Complete Complete   Palliative Care Screening  Not Applicable   Skilled Nursing Facility Not Applicable Complete

## 2023-12-24 NOTE — Plan of Care (Signed)
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
## 2023-12-24 NOTE — Discharge Instructions (Signed)
[employer]
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
## 2024-01-18 DIAGNOSIS — F331 Major depressive disorder, recurrent, moderate: Secondary | ICD-10-CM | POA: Diagnosis not present

## 2024-01-18 DIAGNOSIS — F5101 Primary insomnia: Secondary | ICD-10-CM | POA: Diagnosis not present

## 2024-01-18 DIAGNOSIS — F1094 Alcohol use, unspecified with alcohol-induced mood disorder: Secondary | ICD-10-CM | POA: Diagnosis not present

## 2024-01-18 DIAGNOSIS — F411 Generalized anxiety disorder: Secondary | ICD-10-CM | POA: Diagnosis not present

## 2024-02-05 DIAGNOSIS — F1094 Alcohol use, unspecified with alcohol-induced mood disorder: Secondary | ICD-10-CM | POA: Diagnosis not present

## 2024-02-05 DIAGNOSIS — F411 Generalized anxiety disorder: Secondary | ICD-10-CM | POA: Diagnosis not present

## 2024-02-05 DIAGNOSIS — F331 Major depressive disorder, recurrent, moderate: Secondary | ICD-10-CM | POA: Diagnosis not present

## 2024-02-05 DIAGNOSIS — F5101 Primary insomnia: Secondary | ICD-10-CM | POA: Diagnosis not present

## 2024-08-05 DIAGNOSIS — M109 Gout, unspecified: Secondary | ICD-10-CM | POA: Diagnosis not present

## 2024-08-05 DIAGNOSIS — E663 Overweight: Secondary | ICD-10-CM | POA: Diagnosis not present

## 2024-08-05 DIAGNOSIS — E039 Hypothyroidism, unspecified: Secondary | ICD-10-CM | POA: Diagnosis not present

## 2024-08-05 DIAGNOSIS — M545 Low back pain, unspecified: Secondary | ICD-10-CM | POA: Diagnosis not present

## 2024-08-05 DIAGNOSIS — F1021 Alcohol dependence, in remission: Secondary | ICD-10-CM | POA: Diagnosis not present

## 2024-08-05 DIAGNOSIS — F5101 Primary insomnia: Secondary | ICD-10-CM | POA: Diagnosis not present

## 2024-08-05 DIAGNOSIS — F419 Anxiety disorder, unspecified: Secondary | ICD-10-CM | POA: Diagnosis not present

## 2024-08-05 DIAGNOSIS — E782 Mixed hyperlipidemia: Secondary | ICD-10-CM | POA: Diagnosis not present
